# Patient Record
Sex: Female | Born: 1955 | Race: Black or African American | Hispanic: No | Marital: Single | Smoking: Never smoker
Health system: Southern US, Community
[De-identification: ages and names within clinical notes are randomized; demographics above are authoritative.]

## PROBLEM LIST (undated history)

## (undated) DIAGNOSIS — R0989 Other specified symptoms and signs involving the circulatory and respiratory systems: Secondary | ICD-10-CM

## (undated) DIAGNOSIS — M1990 Unspecified osteoarthritis, unspecified site: Secondary | ICD-10-CM

## (undated) DIAGNOSIS — R011 Cardiac murmur, unspecified: Secondary | ICD-10-CM

## (undated) DIAGNOSIS — D573 Sickle-cell trait: Secondary | ICD-10-CM

## (undated) DIAGNOSIS — I10 Essential (primary) hypertension: Secondary | ICD-10-CM

## (undated) HISTORY — DX: Essential (primary) hypertension: I10

## (undated) HISTORY — DX: Unspecified osteoarthritis, unspecified site: M19.90

## (undated) HISTORY — PX: ABDOMINAL HYSTERECTOMY: SHX81

---

## 2005-02-02 DIAGNOSIS — Z5189 Encounter for other specified aftercare: Secondary | ICD-10-CM | POA: Insufficient documentation

## 2005-02-02 HISTORY — PX: ABDOMINAL HYSTERECTOMY: SHX81

## 2005-02-02 HISTORY — DX: Encounter for other specified aftercare: Z51.89

## 2014-01-10 ENCOUNTER — Ambulatory Visit: Payer: Self-pay | Attending: Internal Medicine | Admitting: Internal Medicine

## 2014-01-10 ENCOUNTER — Encounter: Payer: Self-pay | Admitting: Internal Medicine

## 2014-01-10 VITALS — BP 177/109 | HR 83 | Temp 98.1°F | Resp 16 | Ht 71.0 in | Wt 210.0 lb

## 2014-01-10 DIAGNOSIS — I10 Essential (primary) hypertension: Secondary | ICD-10-CM | POA: Insufficient documentation

## 2014-01-10 DIAGNOSIS — Z882 Allergy status to sulfonamides status: Secondary | ICD-10-CM | POA: Insufficient documentation

## 2014-01-10 HISTORY — DX: Essential (primary) hypertension: I10

## 2014-01-10 LAB — COMPLETE METABOLIC PANEL WITH GFR
ALBUMIN: 4 g/dL (ref 3.5–5.2)
ALK PHOS: 87 U/L (ref 39–117)
ALT: 12 U/L (ref 0–35)
AST: 16 U/L (ref 0–37)
BILIRUBIN TOTAL: 0.5 mg/dL (ref 0.2–1.2)
BUN: 10 mg/dL (ref 6–23)
CHLORIDE: 107 meq/L (ref 96–112)
CO2: 30 mEq/L (ref 19–32)
Calcium: 9.5 mg/dL (ref 8.4–10.5)
Creat: 0.63 mg/dL (ref 0.50–1.10)
GFR, Est African American: >89 mL/min
GFR, Est Non African American: >89 mL/min
Glucose, Bld: 88 mg/dL (ref 70–99)
POTASSIUM: 4.1 meq/L (ref 3.5–5.3)
Sodium: 146 mEq/L — ABNORMAL HIGH (ref 135–145)
Total Protein: 7 g/dL (ref 6.0–8.3)

## 2014-01-10 LAB — LIPID PANEL
Cholesterol: 193 mg/dL (ref 0–200)
HDL: 61 mg/dL (ref >39)
LDL CALC: 122 mg/dL — AB (ref 0–99)
Total CHOL/HDL Ratio: 3.2 Ratio
Triglycerides: 48 mg/dL (ref <150)
VLDL: 10 mg/dL (ref 0–40)

## 2014-01-10 LAB — CBC
HCT: 34 % — ABNORMAL LOW (ref 36.0–46.0)
HEMOGLOBIN: 11.8 g/dL — AB (ref 12.0–15.0)
MCH: 26.4 pg (ref 26.0–34.0)
MCHC: 34.7 g/dL (ref 30.0–36.0)
MCV: 76.1 fL — ABNORMAL LOW (ref 78.0–100.0)
MPV: 10.4 fL (ref 9.4–12.4)
PLATELETS: 233 10*3/uL (ref 150–400)
RBC: 4.47 MIL/uL (ref 3.87–5.11)
RDW: 13.6 % (ref 11.5–15.5)
WBC: 4 10*3/uL (ref 4.0–10.5)

## 2014-01-10 MED ORDER — AMLODIPINE BESYLATE-VALSARTAN 10-160 MG PO TABS: 1.0000 | ORAL_TABLET | Freq: Every day | ORAL | Status: DC

## 2014-01-10 MED ORDER — HYDROCHLOROTHIAZIDE 12.5 MG PO CAPS: 12.5000 mg | ORAL_CAPSULE | Freq: Every day | ORAL | Status: DC

## 2014-01-10 NOTE — Progress Notes (Signed)
Patient ID: Kaylee Nelson, female   DOB: 1955-08-19, 58 y.o.   MRN: [id number]  CC: Establish care  HPI:  Patient presents to clinic today with a history of hypertension.  She states that she has been off her blood pressure medication for 2 months.  She states that for the past two weeks she has been having headaches in the mornings.  She reports that she was previously on Exforge and HCTZ which helped to regulate her BP in the past.    [facility]--up to date on mammogram and colonoscopy  Social Hx: Denies tobacco, illicit drug, alcohol use   Allergies  Allergen Reactions  . Sulfa Antibiotics    History reviewed. No pertinent past medical history. No current outpatient prescriptions on file prior to visit.   No current facility-administered medications on file prior to visit.   Family History  Problem Relation Age of Onset  . Cancer Mother   . Cancer Daughter   . Cancer Maternal Aunt    History   Social History  . Marital Status: Married    Spouse Name: N/A    Number of Children: N/A  . Years of Education: N/A   Occupational History  . Not on file.   Social History Main Topics  . Smoking status: Never Smoker   . Smokeless tobacco: Not on file  . Alcohol Use: No  . Drug Use: No  . Sexual Activity: No   Other Topics Concern  . Not on file   Social History Narrative  . No narrative on file    Review of Systems  Eyes: Positive for blurred vision (right eyes).  Respiratory: Negative.   Cardiovascular: Positive for leg swelling. Negative for chest pain, palpitations and claudication.  Neurological: Positive for headaches. Negative for dizziness.      Objective:   Filed Vitals:   01/10/14 1153  BP: 177/109  Pulse: 83  Temp: 98.1 F (36.7 C)  Resp: 16    Physical Exam  Constitutional: She is oriented to person, place, and time.  Cardiovascular: Normal rate, regular rhythm and normal heart sounds.   Pulmonary/Chest: Effort normal and breath sounds  normal.  Musculoskeletal: She exhibits no edema.  Neurological: She is alert and oriented to person, place, and time.  Skin: Skin is warm and dry.     No results found for: WBC, HGB, HCT, MCV, PLT No results found for: CREATININE, BUN, NA, K, CL, CO2  No results found for: HGBA1C Lipid Panel  No results found for: CHOL, TRIG, HDL, CHOLHDL, VLDL, LDLCALC     Assessment and plan:   Leigh was seen today for establish care.  Diagnoses and associated orders for this visit:  Essential hypertension - May begin back on amLODipine-valsartan (EXFORGE) 10-160 MG per tablet; Take 1 tablet by mouth daily and hydrochlorothiazide (MICROZIDE) 12.5 MG capsule; Take 1 capsule (12.5 mg total) by mouth daily. - CBC - COMPLETE METABOLIC PANEL WITH GFR - Lipid panel   Return in about 2 weeks (around 01/24/2014) for Nurse Visit-BP check and 3 mo PCP.       Chari Manning, NP-C [facility] [phone] 01/10/2014, 12:32 PM

## 2014-01-10 NOTE — Progress Notes (Signed)
Pt is here to establish care. Pt has a history of HTN and has not had her BP medications for 3 months. Pt  States that she has headaches in the mornings.

## 2014-01-10 NOTE — Patient Instructions (Signed)
DASH Eating Plan °DASH stands for "Dietary Approaches to Stop Hypertension." The DASH eating plan is a healthy eating plan that has been shown to reduce high blood pressure (hypertension). Additional health benefits may include reducing the risk of type 2 diabetes mellitus, heart disease, and stroke. The DASH eating plan may also help with weight loss. °WHAT DO I NEED TO KNOW ABOUT THE DASH EATING PLAN? °For the DASH eating plan, you will follow these general guidelines: °· Choose foods with a percent daily value for sodium of less than 5% (as listed on the food label). °· Use salt-free seasonings or herbs instead of table salt or sea salt. °· Check with your health care provider or pharmacist before using salt substitutes. °· Eat lower-sodium products, often labeled as "lower sodium" or "no salt added." °· Eat fresh foods. °· Eat more vegetables, fruits, and low-fat dairy products. °· Choose whole grains. Look for the word "whole" as the first word in the ingredient list. °· Choose fish and skinless chicken or turkey more often than red meat. Limit fish, poultry, and meat to 6 oz (170 g) each day. °· Limit sweets, desserts, sugars, and sugary drinks. °· Choose heart-healthy fats. °· Limit cheese to 1 oz (28 g) per day. °· Eat more home-cooked food and less restaurant, buffet, and fast food. °· Limit fried foods. °· Cook foods using methods other than frying. °· Limit canned vegetables. If you do use them, rinse them well to decrease the sodium. °· When eating at a restaurant, ask that your food be prepared with less salt, or no salt if possible. °WHAT FOODS CAN I EAT? °Seek help from a dietitian for individual calorie needs. °Grains °Whole grain or whole wheat bread. Brown rice. Whole grain or whole wheat pasta. Quinoa, bulgur, and whole grain cereals. Low-sodium cereals. Corn or whole wheat flour tortillas. Whole grain cornbread. Whole grain crackers. Low-sodium crackers. °Vegetables °Fresh or frozen vegetables  (raw, steamed, roasted, or grilled). Low-sodium or reduced-sodium tomato and vegetable juices. Low-sodium or reduced-sodium tomato sauce and paste. Low-sodium or reduced-sodium canned vegetables.  °Fruits °All fresh, canned (in natural juice), or frozen fruits. °Meat and Other Protein Products °Ground beef (85% or leaner), grass-fed beef, or beef trimmed of fat. Skinless chicken or turkey. Ground chicken or turkey. Pork trimmed of fat. All fish and seafood. Eggs. Dried beans, peas, or lentils. Unsalted nuts and seeds. Unsalted canned beans. °Dairy °Low-fat dairy products, such as skim or 1% milk, 2% or reduced-fat cheeses, low-fat ricotta or cottage cheese, or plain low-fat yogurt. Low-sodium or reduced-sodium cheeses. °Fats and Oils °Tub margarines without trans fats. Light or reduced-fat mayonnaise and salad dressings (reduced sodium). Avocado. Safflower, olive, or canola oils. Natural peanut or almond butter. °Other °Unsalted popcorn and pretzels. °The items listed above may not be a complete list of recommended foods or beverages. Contact your dietitian for more options. °WHAT FOODS ARE NOT RECOMMENDED? °Grains °White bread. White pasta. White rice. Refined cornbread. Bagels and croissants. Crackers that contain trans fat. °Vegetables °Creamed or fried vegetables. Vegetables in a cheese sauce. Regular canned vegetables. Regular canned tomato sauce and paste. Regular tomato and vegetable juices. °Fruits °Dried fruits. Canned fruit in light or heavy syrup. Fruit juice. °Meat and Other Protein Products °Fatty cuts of meat. Ribs, chicken wings, bacon, sausage, bologna, salami, chitterlings, fatback, hot dogs, bratwurst, and packaged luncheon meats. Salted nuts and seeds. Canned beans with salt. °Dairy °Whole or 2% milk, cream, half-and-half, and cream cheese. Whole-fat or sweetened yogurt. Full-fat   cheeses or blue cheese. Nondairy creamers and whipped toppings. Processed cheese, cheese spreads, or cheese  curds. °Condiments °Onion and garlic salt, seasoned salt, table salt, and sea salt. Canned and packaged gravies. Worcestershire sauce. Tartar sauce. Barbecue sauce. Teriyaki sauce. Soy sauce, including reduced sodium. Steak sauce. Fish sauce. Oyster sauce. Cocktail sauce. Horseradish. Ketchup and mustard. Meat flavorings and tenderizers. Bouillon cubes. Hot sauce. Tabasco sauce. Marinades. Taco seasonings. Relishes. °Fats and Oils °Butter, stick margarine, lard, shortening, ghee, and bacon fat. Coconut, palm kernel, or palm oils. Regular salad dressings. °Other °Pickles and olives. Salted popcorn and pretzels. °The items listed above may not be a complete list of foods and beverages to avoid. Contact your dietitian for more information. °WHERE CAN I FIND MORE INFORMATION? °National Heart, Lung, and Blood Institute: www.nhlbi.nih.gov/health/health-topics/topics/dash/ °Document Released: 01/08/2011 Document Revised: 06/05/2013 Document Reviewed: 11/23/2012 °ExitCare® Patient Information ©2015 Exit[employer], [employer]. This information is not intended to replace advice given to you by your health care provider. Make sure you discuss any questions you have with your health care provider. ° °

## 2014-01-15 ENCOUNTER — Telehealth: Payer: Self-pay | Admitting: *Deleted

## 2014-01-15 NOTE — Telephone Encounter (Signed)
-----   Message from Lance Bosch, NP sent at 01/11/2014  6:20 PM EST ----- Labs are within normal limits with exception of Cholesterol slightly elevated. Please provide appropriate education regarding diet and exercise.

## 2014-01-15 NOTE — Telephone Encounter (Signed)
Left message on patient's VM to return call to discuss lab results

## 2014-01-30 ENCOUNTER — Ambulatory Visit: Payer: Self-pay | Attending: Internal Medicine

## 2014-01-30 ENCOUNTER — Ambulatory Visit: Payer: Self-pay

## 2014-01-30 NOTE — Patient Instructions (Signed)
Hypertension Hypertension, commonly called high blood pressure, is when the force of blood pumping through your arteries is too strong. Your arteries are the blood vessels that carry blood from your heart throughout your body. A blood pressure reading consists of a higher number over a lower number, such as 110/72. The higher number (systolic) is the pressure inside your arteries when your heart pumps. The lower number (diastolic) is the pressure inside your arteries when your heart relaxes. Ideally you want your blood pressure below 120/80. Hypertension forces your heart to work harder to pump blood. Your arteries may become narrow or stiff. Having hypertension puts you at risk for heart disease, stroke, and other problems.  RISK FACTORS Some risk factors for high blood pressure are controllable. Others are not.  Risk factors you cannot control include:   Race. You may be at higher risk if you are African American.  Age. Risk increases with age.  Gender. Men are at higher risk than women before age 45 years. After age 65, women are at higher risk than men. Risk factors you can control include:  Not getting enough exercise or physical activity.  Being overweight.  Getting too much fat, sugar, calories, or salt in your diet.  Drinking too much alcohol. SIGNS AND SYMPTOMS Hypertension does not usually cause signs or symptoms. Extremely high blood pressure (hypertensive crisis) may cause headache, anxiety, shortness of breath, and nosebleed. DIAGNOSIS  To check if you have hypertension, your health care provider will measure your blood pressure while you are seated, with your arm held at the level of your heart. It should be measured at least twice using the same arm. Certain conditions can cause a difference in blood pressure between your right and left arms. A blood pressure reading that is higher than normal on one occasion does not mean that you need treatment. If one blood pressure reading  is high, ask your health care provider about having it checked again. TREATMENT  Treating high blood pressure includes making lifestyle changes and possibly taking medicine. Living a healthy lifestyle can help lower high blood pressure. You may need to change some of your habits. Lifestyle changes may include:  Following the DASH diet. This diet is high in fruits, vegetables, and whole grains. It is low in salt, red meat, and added sugars.  Getting at least 2 hours of brisk physical activity every week.  Losing weight if necessary.  Not smoking.  Limiting alcoholic beverages.  Learning ways to reduce stress. If lifestyle changes are not enough to get your blood pressure under control, your health care provider may prescribe medicine. You may need to take more than one. Work closely with your health care provider to understand the risks and benefits. HOME CARE INSTRUCTIONS  Have your blood pressure rechecked as directed by your health care provider.   Take medicines only as directed by your health care provider. Follow the directions carefully. Blood pressure medicines must be taken as prescribed. The medicine does not work as well when you skip doses. Skipping doses also puts you at risk for problems.   Do not smoke.   Monitor your blood pressure at home as directed by your health care provider. SEEK MEDICAL CARE IF:   You think you are having a reaction to medicines taken.  You have recurrent headaches or feel dizzy.  You have swelling in your ankles.  You have trouble with your vision. SEEK IMMEDIATE MEDICAL CARE IF:  You develop a severe headache or confusion.    You have unusual weakness, numbness, or feel faint.  You have severe chest or abdominal pain.  You vomit repeatedly.  You have trouble breathing. MAKE SURE YOU:   Understand these instructions.  Will watch your condition.  Will get help right away if you are not doing well or get worse. Document  Released: 01/19/2005 Document Revised: 06/05/2013 Document Reviewed: 11/11/2012 ExitCare Patient Information 2015 Exit[employer]. This information is not intended to replace advice given to you by your health care provider. Make sure you discuss any questions you have with your health care provider. DASH Eating Plan DASH stands for "Dietary Approaches to Stop Hypertension." The DASH eating plan is a healthy eating plan that has been shown to reduce high blood pressure (hypertension). Additional health benefits may include reducing the risk of type 2 diabetes mellitus, heart disease, and stroke. The DASH eating plan may also help with weight loss. WHAT DO I NEED TO KNOW ABOUT THE DASH EATING PLAN? For the DASH eating plan, you will follow these general guidelines:  Choose foods with a percent daily value for sodium of less than 5% (as listed on the food label).  Use salt-free seasonings or herbs instead of table salt or sea salt.  Check with your health care provider or pharmacist before using salt substitutes.  Eat lower-sodium products, often labeled as "lower sodium" or "no salt added."  Eat fresh foods.  Eat more vegetables, fruits, and low-fat dairy products.  Choose whole grains. Look for the word "whole" as the first word in the ingredient list.  Choose fish and skinless chicken or turkey more often than red meat. Limit fish, poultry, and meat to 6 oz (170 g) each day.  Limit sweets, desserts, sugars, and sugary drinks.  Choose heart-healthy fats.  Limit cheese to 1 oz (28 g) per day.  Eat more home-cooked food and less restaurant, buffet, and fast food.  Limit fried foods.  Cook foods using methods other than frying.  Limit canned vegetables. If you do use them, rinse them well to decrease the sodium.  When eating at a restaurant, ask that your food be prepared with less salt, or no salt if possible. WHAT FOODS CAN I EAT? Seek help from a dietitian for individual  calorie needs. Grains Whole grain or whole wheat bread. Brown rice. Whole grain or whole wheat pasta. Quinoa, bulgur, and whole grain cereals. Low-sodium cereals. Corn or whole wheat flour tortillas. Whole grain cornbread. Whole grain crackers. Low-sodium crackers. Vegetables Fresh or frozen vegetables (raw, steamed, roasted, or grilled). Low-sodium or reduced-sodium tomato and vegetable juices. Low-sodium or reduced-sodium tomato sauce and paste. Low-sodium or reduced-sodium canned vegetables.  Fruits All fresh, canned (in natural juice), or frozen fruits. Meat and Other Protein Products Ground beef (85% or leaner), grass-fed beef, or beef trimmed of fat. Skinless chicken or turkey. Ground chicken or turkey. Pork trimmed of fat. All fish and seafood. Eggs. Dried beans, peas, or lentils. Unsalted nuts and seeds. Unsalted canned beans. Dairy Low-fat dairy products, such as skim or 1% milk, 2% or reduced-fat cheeses, low-fat ricotta or cottage cheese, or plain low-fat yogurt. Low-sodium or reduced-sodium cheeses. Fats and Oils Tub margarines without trans fats. Light or reduced-fat mayonnaise and salad dressings (reduced sodium). Avocado. Safflower, olive, or canola oils. Natural peanut or almond butter. Other Unsalted popcorn and pretzels. The items listed above may not be a complete list of recommended foods or beverages. Contact your dietitian for more options. WHAT FOODS ARE NOT RECOMMENDED? Grains White bread.   White pasta. White rice. Refined cornbread. Bagels and croissants. Crackers that contain trans fat. Vegetables Creamed or fried vegetables. Vegetables in a cheese sauce. Regular canned vegetables. Regular canned tomato sauce and paste. Regular tomato and vegetable juices. Fruits Dried fruits. Canned fruit in light or heavy syrup. Fruit juice. Meat and Other Protein Products Fatty cuts of meat. Ribs, chicken wings, bacon, sausage, bologna, salami, chitterlings, fatback, hot dogs,  bratwurst, and packaged luncheon meats. Salted nuts and seeds. Canned beans with salt. Dairy Whole or 2% milk, cream, half-and-half, and cream cheese. Whole-fat or sweetened yogurt. Full-fat cheeses or blue cheese. Nondairy creamers and whipped toppings. Processed cheese, cheese spreads, or cheese curds. Condiments Onion and garlic salt, seasoned salt, table salt, and sea salt. Canned and packaged gravies. Worcestershire sauce. Tartar sauce. Barbecue sauce. Teriyaki sauce. Soy sauce, including reduced sodium. Steak sauce. Fish sauce. Oyster sauce. Cocktail sauce. Horseradish. Ketchup and mustard. Meat flavorings and tenderizers. Bouillon cubes. Hot sauce. Tabasco sauce. Marinades. Taco seasonings. Relishes. Fats and Oils Butter, stick margarine, lard, shortening, ghee, and bacon fat. Coconut, palm kernel, or palm oils. Regular salad dressings. Other Pickles and olives. Salted popcorn and pretzels. The items listed above may not be a complete list of foods and beverages to avoid. Contact your dietitian for more information. WHERE CAN I FIND MORE INFORMATION? National Heart, Lung, and Blood Institute: www.nhlbi.nih.gov/health/health-topics/topics/dash/ Document Released: 01/08/2011 Document Revised: 06/05/2013 Document Reviewed: 11/23/2012 ExitCare Patient Information 2015 ExitCare, [employer]. This information is not intended to replace advice given to you by your health care provider. Make sure you discuss any questions you have with your health care provider.  

## 2014-01-30 NOTE — Progress Notes (Unsigned)
Patient ID: Kaylee Nelson, female   DOB: [birth year], 58 y.o.   MRN: [id number] Pt comes in today for blood pressure recheck after started on Amlodipine-Valsartan and HCTZ per last visit 01/10/14 with elevated BP 177/109 83 Pt states she is compliant with taking medication daily with BP home monitoring  Ranging 130's/70-80's Denies blurred vision or headaches Refills at [place] BP- 129/73 79 Pt encouraged to keep up good work and continue DASH diet

## 2014-02-07 ENCOUNTER — Encounter: Payer: Self-pay | Admitting: Internal Medicine

## 2014-02-07 ENCOUNTER — Ambulatory Visit: Payer: Self-pay | Attending: Internal Medicine | Admitting: Internal Medicine

## 2014-02-07 VITALS — BP 115/77 | HR 94 | Temp 98.6°F | Resp 16 | Ht 71.0 in | Wt 212.0 lb

## 2014-02-07 DIAGNOSIS — Z79899 Other long term (current) drug therapy: Secondary | ICD-10-CM | POA: Insufficient documentation

## 2014-02-07 DIAGNOSIS — Z90710 Acquired absence of both cervix and uterus: Secondary | ICD-10-CM | POA: Insufficient documentation

## 2014-02-07 DIAGNOSIS — Z0000 Encounter for general adult medical examination without abnormal findings: Secondary | ICD-10-CM

## 2014-02-07 DIAGNOSIS — I10 Essential (primary) hypertension: Secondary | ICD-10-CM | POA: Insufficient documentation

## 2014-02-07 NOTE — Progress Notes (Signed)
Patient is here for physical Patient had blood work done 12/15 Patient does not need any med refills and has had her flu shot. Patient has employment medical form she would like filled out.

## 2014-02-07 NOTE — Progress Notes (Signed)
Patient ID: Kaylee Nelson, female   DOB: [birth year], 59 y.o.   MRN: [id number]  CC: physical  HPI: Kaylee Nelson is a 59 y.o. female here today for a follow up visit.  Patient has past medical history of hypertension.  She presents today for a annual physical.  She reports that after her hysterectomy she was told that she will not need a pap smear.  She states that she only has her ovaries.  She reports that she has had all STD testing which came back negative.  She denies vaginal discharge itch or odor.  She refuses STD testing today.   Surgical history: hysterectomy, rotator cuff repair Social Hx: denies alchol, tobacco, drug use. Married with 4 children.    Patient has No headache, No chest pain, No abdominal pain - No Nausea, No new weakness tingling or numbness, No Cough - SOB.  Allergies  Allergen Reactions  . Sulfa Antibiotics    Past Medical History  Diagnosis Date  . Hypertension    Current Outpatient Prescriptions on File Prior to Visit  Medication Sig Dispense Refill  . amLODipine-valsartan (EXFORGE) 10-160 MG per tablet Take 1 tablet by mouth daily. 30 tablet 3  . hydrochlorothiazide (MICROZIDE) 12.5 MG capsule Take 1 capsule (12.5 mg total) by mouth daily. 30 capsule 3   No current facility-administered medications on file prior to visit.   Family History  Problem Relation Age of Onset  . Cancer Mother   . Cancer Daughter   . Cancer Maternal Aunt    History   Social History  . Marital Status: Married    Spouse Name: N/A    Number of Children: N/A  . Years of Education: N/A   Occupational History  . Not on file.   Social History Main Topics  . Smoking status: Never Smoker   . Smokeless tobacco: Not on file  . Alcohol Use: No  . Drug Use: No  . Sexual Activity: No   Other Topics Concern  . Not on file   Social History Narrative    Review of Systems: Constitutional: Negative for fever, chills, diaphoresis, activity change, appetite change and  fatigue. HENT: Negative for ear pain, nosebleeds, congestion, facial swelling, rhinorrhea, neck pain, neck stiffness and ear discharge.  Eyes: Negative for pain, discharge, redness, itching and visual disturbance. Respiratory: Negative for cough, choking, chest tightness, shortness of breath, wheezing and stridor.  Cardiovascular: Negative for chest pain, palpitations and leg swelling. Gastrointestinal: Negative for abdominal distention. Genitourinary: Negative for dysuria, urgency, frequency, hematuria, flank pain, decreased urine volume, difficulty urinating and dyspareunia.  Musculoskeletal: Negative for back pain, joint swelling, arthralgias and gait problem. Neurological: Negative for dizziness, tremors, seizures, syncope, facial asymmetry, speech difficulty, weakness, light-headedness, numbness and headaches.  Hematological: Negative for adenopathy. Does not bruise/bleed easily. Psychiatric/Behavioral: Negative for hallucinations, behavioral problems, confusion, dysphoric mood, decreased concentration and agitation.    Objective:   Filed Vitals:   02/07/14 1513  BP: 115/77  Pulse: 94  Temp: 98.6 F (37 C)  Resp: 16    Physical Exam: Constitutional: Patient appears well-developed and well-nourished. No distress. HENT: Normocephalic, atraumatic, External right and left ear normal. Oropharynx is clear and moist.  Eyes: Conjunctivae and EOM are normal. PERRLA, no scleral icterus. Neck: Normal ROM. Neck supple. No JVD. No tracheal deviation. No thyromegaly. CVS: RRR, S1/S2 +, no murmurs, no gallops, no carotid bruit.  Pulmonary: Effort and breath sounds normal, no stridor, rhonchi, wheezes, rales.  Abdominal: Soft. BS +,  no distension, tenderness,  rebound or guarding.  Musculoskeletal: Normal range of motion. No edema and no tenderness.  Lymphadenopathy: No lymphadenopathy noted, cervical Neuro: Alert. Normal reflexes, muscle tone coordination. No cranial nerve deficit. Skin:  Skin is warm and dry. No rash noted. Not diaphoretic. No erythema. No pallor. Psychiatric: Normal mood and affect. Behavior, judgment, thought content normal.  Physical Exam  Pulmonary/Chest: Right breast exhibits no inverted nipple, no mass and no skin change. Left breast exhibits no inverted nipple, no mass and no skin change.  Genitourinary: No breast tenderness or discharge.     Lab Results  Component Value Date   WBC 4.0 01/10/2014   HGB 11.8* 01/10/2014   HCT 34.0* 01/10/2014   MCV 76.1* 01/10/2014   PLT 233 01/10/2014   Lab Results  Component Value Date   CREATININE 0.63 01/10/2014   BUN 10 01/10/2014   NA 146* 01/10/2014   K 4.1 01/10/2014   CL 107 01/10/2014   CO2 30 01/10/2014    No results found for: HGBA1C Lipid Panel     Component Value Date/Time   CHOL 193 01/10/2014 1253   TRIG 48 01/10/2014 1253   HDL 61 01/10/2014 1253   CHOLHDL 3.2 01/10/2014 1253   VLDL 10 01/10/2014 1253   LDLCALC 122* 01/10/2014 1253       Assessment and plan:   Kaylee Nelson was seen today for annual exam and hypertension.  Diagnoses and associated orders for this visit:  Annual physical exam Explained routine health maintanence for women including annual influenza vaccine, pneumonia vaccine, breast cancer screening after age 35, montly breast exams, STD prevention and screening, colonoscopy at age 64, smoking cessation, avoiding alcohol misuse  Return in about 3 months (around 05/09/2014) for Hypertension.        Chari Manning, NP-C [facility] [phone] 02/07/2014, 3:43 PM

## 2014-02-07 NOTE — Patient Instructions (Signed)

## 2014-02-12 ENCOUNTER — Telehealth: Payer: Self-pay | Admitting: Internal Medicine

## 2014-02-12 NOTE — Telephone Encounter (Signed)
Pt states she has been feeling really fatigued and not sure if it has to do with current bp med she is taking. Please f/u with pt.

## 2014-02-14 ENCOUNTER — Telehealth: Payer: Self-pay | Admitting: Internal Medicine

## 2014-02-14 NOTE — Telephone Encounter (Signed)
Pt complaining of cough. Advised to come to walking clinic tomorrow between 9-11 and 2-4

## 2014-02-14 NOTE — Telephone Encounter (Signed)
Patient came into facility stating that she has not been feeling well, and did not have much energy with a lot of fatigue followed with a cough. Patient would like to know if it is related to her blood pressure medication. Please f/u with pt.

## 2014-02-14 NOTE — Telephone Encounter (Signed)
Left voice message with female to return call

## 2014-02-26 ENCOUNTER — Ambulatory Visit: Payer: Self-pay

## 2014-04-27 ENCOUNTER — Ambulatory Visit: Payer: Self-pay | Attending: Internal Medicine

## 2014-05-01 ENCOUNTER — Encounter: Payer: Self-pay | Admitting: Internal Medicine

## 2014-05-01 ENCOUNTER — Ambulatory Visit: Payer: Self-pay | Attending: Internal Medicine | Admitting: Internal Medicine

## 2014-05-01 VITALS — BP 140/80 | HR 70 | Temp 97.9°F | Resp 16 | Ht 71.0 in | Wt 202.0 lb

## 2014-05-01 DIAGNOSIS — R05 Cough: Secondary | ICD-10-CM | POA: Insufficient documentation

## 2014-05-01 DIAGNOSIS — R062 Wheezing: Secondary | ICD-10-CM | POA: Insufficient documentation

## 2014-05-01 DIAGNOSIS — J069 Acute upper respiratory infection, unspecified: Secondary | ICD-10-CM

## 2014-05-01 DIAGNOSIS — I10 Essential (primary) hypertension: Secondary | ICD-10-CM

## 2014-05-01 DIAGNOSIS — R1084 Generalized abdominal pain: Secondary | ICD-10-CM

## 2014-05-01 DIAGNOSIS — R51 Headache: Secondary | ICD-10-CM | POA: Insufficient documentation

## 2014-05-01 DIAGNOSIS — J302 Other seasonal allergic rhinitis: Secondary | ICD-10-CM

## 2014-05-01 LAB — POCT URINALYSIS DIPSTICK
BILIRUBIN UA: NEGATIVE
Blood, UA: NEGATIVE
GLUCOSE UA: NEGATIVE
Ketones, UA: NEGATIVE
NITRITE UA: NEGATIVE
Protein, UA: NEGATIVE
Spec Grav, UA: 1.015
Urobilinogen, UA: 0.2
pH, UA: 5.5

## 2014-05-01 MED ORDER — LORATADINE 10 MG PO TABS: 10.0000 mg | ORAL_TABLET | Freq: Every day | ORAL | Status: DC

## 2014-05-01 MED ORDER — AMLODIPINE BESYLATE-VALSARTAN 10-160 MG PO TABS
1.0000 | ORAL_TABLET | Freq: Every day | ORAL | Status: DC
Start: 2014-05-01 — End: 2014-09-28

## 2014-05-01 MED ORDER — HYDROCHLOROTHIAZIDE 12.5 MG PO CAPS: 12.5000 mg | ORAL_CAPSULE | Freq: Every day | ORAL | Status: DC

## 2014-05-01 MED ORDER — AMOXICILLIN-POT CLAVULANATE 875-125 MG PO TABS
1.0000 | ORAL_TABLET | Freq: Two times a day (BID) | ORAL | Status: DC
Start: 2014-05-01 — End: 2014-10-09

## 2014-05-01 MED ORDER — FLUTICASONE PROPIONATE 50 MCG/ACT NA SUSP: 2.0000 | Freq: Every day | NASAL | Status: DC

## 2014-05-01 NOTE — Patient Instructions (Addendum)
Antibiotic may cause some diarrhea the first couple of days    Upper Respiratory Infection, Adult An upper respiratory infection (URI) is also sometimes known as the common cold. The upper respiratory tract includes the nose, sinuses, throat, trachea, and bronchi. Bronchi are the airways leading to the lungs. Most people improve within 1 week, but symptoms can last up to 2 weeks. A residual cough may last even longer.  CAUSES Many different viruses can infect the tissues lining the upper respiratory tract. The tissues become irritated and inflamed and often become very moist. Mucus production is also common. A cold is contagious. You can easily spread the virus to others by oral contact. This includes kissing, sharing a glass, coughing, or sneezing. Touching your mouth or nose and then touching a surface, which is then touched by another person, can also spread the virus. SYMPTOMS  Symptoms typically develop 1 to 3 days after you come in contact with a cold virus. Symptoms vary from person to person. They may include:  Runny nose.  Sneezing.  Nasal congestion.  Sinus irritation.  Sore throat.  Loss of voice (laryngitis).  Cough.  Fatigue.  Muscle aches.  Loss of appetite.  Headache.  Low-grade fever. DIAGNOSIS  You might diagnose your own cold based on familiar symptoms, since most people get a cold 2 to 3 times a year. Your caregiver can confirm this based on your exam. Most importantly, your caregiver can check that your symptoms are not due to another disease such as strep throat, sinusitis, pneumonia, asthma, or epiglottitis. Blood tests, throat tests, and X-rays are not necessary to diagnose a common cold, but they may sometimes be helpful in excluding other more serious diseases. Your caregiver will decide if any further tests are required. RISKS AND COMPLICATIONS  You may be at risk for a more severe case of the common cold if you smoke cigarettes, have chronic heart  disease (such as heart failure) or lung disease (such as asthma), or if you have a weakened immune system. The very young and very old are also at risk for more serious infections. Bacterial sinusitis, middle ear infections, and bacterial pneumonia can complicate the common cold. The common cold can worsen asthma and chronic obstructive pulmonary disease (COPD). Sometimes, these complications can require emergency medical care and may be life-threatening. PREVENTION  The best way to protect against getting a cold is to practice good hygiene. Avoid oral or hand contact with people with cold symptoms. Wash your hands often if contact occurs. There is no clear evidence that vitamin C, vitamin E, echinacea, or exercise reduces the chance of developing a cold. However, it is always recommended to get plenty of rest and practice good nutrition. TREATMENT  Treatment is directed at relieving symptoms. There is no cure. Antibiotics are not effective, because the infection is caused by a virus, not by bacteria. Treatment may include:  Increased fluid intake. Sports drinks offer valuable electrolytes, sugars, and fluids.  Breathing heated mist or steam (vaporizer or shower).  Eating chicken soup or other clear broths, and maintaining good nutrition.  Getting plenty of rest.  Using gargles or lozenges for comfort.  Controlling fevers with ibuprofen or acetaminophen as directed by your caregiver.  Increasing usage of your inhaler if you have asthma. Zinc gel and zinc lozenges, taken in the first 24 hours of the common cold, can shorten the duration and lessen the severity of symptoms. Pain medicines may help with fever, muscle aches, and throat pain. A variety  of non-prescription medicines are available to treat congestion and runny nose. Your caregiver can make recommendations and may suggest nasal or lung inhalers for other symptoms.  HOME CARE INSTRUCTIONS   Only take over-the-counter or prescription  medicines for pain, discomfort, or fever as directed by your caregiver.  Use a warm mist humidifier or inhale steam from a shower to increase air moisture. This may keep secretions moist and make it easier to breathe.  Drink enough water and fluids to keep your urine clear or pale yellow.  Rest as needed.  Return to work when your temperature has returned to normal or as your caregiver advises. You may need to stay home longer to avoid infecting others. You can also use a face mask and careful hand washing to prevent spread of the virus. SEEK MEDICAL CARE IF:   After the first few days, you feel you are getting worse rather than better.  You need your caregiver's advice about medicines to control symptoms.  You develop chills, worsening shortness of breath, or brown or red sputum. These may be signs of pneumonia.  You develop yellow or brown nasal discharge or pain in the face, especially when you bend forward. These may be signs of sinusitis.  You develop a fever, swollen neck glands, pain with swallowing, or white areas in the back of your throat. These may be signs of strep throat. SEEK IMMEDIATE MEDICAL CARE IF:   You have a fever.  You develop severe or persistent headache, ear pain, sinus pain, or chest pain.  You develop wheezing, a prolonged cough, cough up blood, or have a change in your usual mucus (if you have chronic lung disease).  You develop sore muscles or a stiff neck. Document Released: 07/15/2000 Document Revised: 04/13/2011 Document Reviewed: 04/26/2013 [facility] Patient Information 2015 [place], [place]. This information is not intended to replace advice given to you by your health care provider. Make sure you discuss any questions you have with your health care provider.

## 2014-05-01 NOTE — Progress Notes (Signed)
Pt is here today c/o a bad cold for the past 2 months w/ no improvement. Pt is c/o pain in her belly button.

## 2014-05-01 NOTE — Progress Notes (Signed)
Patient ID: Kaylee Nelson, female   DOB: 1955-03-29, 59 y.o.   MRN: [id number]  CC: HTN, cold symptoms   HPI: Kaylee Nelson is a 59 y.o. female here today for a follow up visit.  Patient has past medical history of hypertension. She reports that she takes her medications daily.  For the last 2 months patient reports symptoms of wheezing at night, headaches, watery eyes, sneezing, red eyes, chills, night sweats, cough, sore throat. Has tried coricidin, cough and flu theraflu. Did not try allergy medication.  Patient reports that she has more cough at night and feels like she is having abdominal muscle pain from prolonged coughing.  Patient has No headache, No chest pain, No abdominal pain - No Nausea, No new weakness tingling or numbness, SOB.  Allergies  Allergen Reactions  . Sulfa Antibiotics    Past Medical History  Diagnosis Date  . Hypertension    Current Outpatient Prescriptions on File Prior to Visit  Medication Sig Dispense Refill  . amLODipine-valsartan (EXFORGE) 10-160 MG per tablet Take 1 tablet by mouth daily. 30 tablet 3  . hydrochlorothiazide (MICROZIDE) 12.5 MG capsule Take 1 capsule (12.5 mg total) by mouth daily. 30 capsule 3   No current facility-administered medications on file prior to visit.   Family History  Problem Relation Age of Onset  . Cancer Mother   . Cancer Daughter   . Cancer Maternal Aunt    History   Social History  . Marital Status: Married    Spouse Name: N/A  . Number of Children: N/A  . Years of Education: N/A   Occupational History  . Not on file.   Social History Main Topics  . Smoking status: Never Smoker   . Smokeless tobacco: Not on file  . Alcohol Use: No  . Drug Use: No  . Sexual Activity: No   Other Topics Concern  . Not on file   Social History Narrative    Review of Systems  Constitutional: Positive for chills and malaise/fatigue. Negative for fever.  HENT: Positive for congestion and sore throat.   Respiratory:  Positive for cough. Negative for sputum production and shortness of breath.   Cardiovascular: Negative for chest pain and leg swelling.  Neurological: Positive for headaches.  All other systems reviewed and are negative.    Objective:   Filed Vitals:   05/01/14 1535  BP: 167/71  Pulse: 70  Temp: 97.9 F (36.6 C)  Resp: 16    Physical Exam  Constitutional: She is oriented to person, place, and time.  HENT:  Mouth/Throat: No oropharyngeal exudate.  Allergic shiners bilaterally Cobblestoning of throat  Bilaterally red ear canals, left side tender to touch   Cardiovascular: Normal rate, regular rhythm and normal heart sounds.   Pulmonary/Chest: Effort normal and breath sounds normal. She has no wheezes.  Abdominal: Soft. Bowel sounds are normal. There is tenderness (generalized ).  Lymphadenopathy:    She has cervical adenopathy.  Neurological: She is alert and oriented to person, place, and time.  Skin: Skin is warm and dry.  Vitals reviewed.    Lab Results  Component Value Date   WBC 4.0 01/10/2014   HGB 11.8* 01/10/2014   HCT 34.0* 01/10/2014   MCV 76.1* 01/10/2014   PLT 233 01/10/2014   Lab Results  Component Value Date   CREATININE 0.63 01/10/2014   BUN 10 01/10/2014   NA 146* 01/10/2014   K 4.1 01/10/2014   CL 107 01/10/2014   CO2 30 01/10/2014  No results found for: HGBA1C Lipid Panel     Component Value Date/Time   CHOL 193 01/10/2014 1253   TRIG 48 01/10/2014 1253   HDL 61 01/10/2014 1253   CHOLHDL 3.2 01/10/2014 1253   VLDL 10 01/10/2014 1253   LDLCALC 122* 01/10/2014 1253       Assessment and plan:   Kaylee Nelson was seen today for follow-up.  Diagnoses and all orders for this visit:  URI (upper respiratory infection) Orders: -     amoxicillin-clavulanate (AUGMENTIN) 875-125 MG per tablet; Take 1 tablet by mouth 2 (two) times daily. Will treat since symptoms have been present for several weeks   Other seasonal allergic  rhinitis Orders: -     loratadine (CLARITIN) 10 MG tablet; Take 1 tablet (10 mg total) by mouth daily. -     fluticasone (FLONASE) 50 MCG/ACT nasal spray; Place 2 sprays into both nostrils daily.  Essential hypertension Orders: -     amLODipine-valsartan (EXFORGE) 10-160 MG per tablet; Take 1 tablet by mouth daily. -     hydrochlorothiazide (MICROZIDE) 12.5 MG capsule; Take 1 capsule (12.5 mg total) by mouth daily. Blood pressure elevated today but has been normal on previous visits. Likely elevated d/t prolonged decongestant use. I will not make changes to regimen today  Generalized abdominal pain Orders: -     Urinalysis Dipstick From prolonged coughing, strained muscle   Return in about 3 months (around 08/01/2014) for Hypertension.        Chari Manning, NP-C [facility] [phone] 05/01/2014, 4:25 PM

## 2014-09-20 ENCOUNTER — Other Ambulatory Visit: Payer: Self-pay | Admitting: Internal Medicine

## 2014-09-28 ENCOUNTER — Other Ambulatory Visit: Payer: Self-pay

## 2014-09-28 DIAGNOSIS — I10 Essential (primary) hypertension: Secondary | ICD-10-CM

## 2014-09-28 MED ORDER — HYDROCHLOROTHIAZIDE 12.5 MG PO CAPS: 12.5000 mg | ORAL_CAPSULE | Freq: Every day | ORAL | Status: DC

## 2014-09-28 MED ORDER — AMLODIPINE BESYLATE-VALSARTAN 10-160 MG PO TABS: 1.0000 | ORAL_TABLET | Freq: Every day | ORAL | Status: DC

## 2014-10-09 ENCOUNTER — Encounter: Payer: Self-pay | Admitting: Internal Medicine

## 2014-10-09 ENCOUNTER — Ambulatory Visit: Payer: Self-pay | Attending: Internal Medicine | Admitting: Internal Medicine

## 2014-10-09 VITALS — BP 137/80 | HR 88 | Temp 98.0°F | Resp 16 | Ht 71.0 in | Wt 195.2 lb

## 2014-10-09 DIAGNOSIS — I10 Essential (primary) hypertension: Secondary | ICD-10-CM | POA: Insufficient documentation

## 2014-10-09 DIAGNOSIS — J302 Other seasonal allergic rhinitis: Secondary | ICD-10-CM | POA: Insufficient documentation

## 2014-10-09 LAB — BASIC METABOLIC PANEL
BUN: 11 mg/dL (ref 7–25)
CHLORIDE: 103 mmol/L (ref 98–110)
CO2: 33 mmol/L — ABNORMAL HIGH (ref 20–31)
Calcium: 9.6 mg/dL (ref 8.6–10.4)
Creat: 0.7 mg/dL (ref 0.50–1.05)
Glucose, Bld: 108 mg/dL — ABNORMAL HIGH (ref 65–99)
POTASSIUM: 3.9 mmol/L (ref 3.5–5.3)
Sodium: 143 mmol/L (ref 135–146)

## 2014-10-09 MED ORDER — AMLODIPINE BESYLATE-VALSARTAN 10-160 MG PO TABS
1.0000 | ORAL_TABLET | Freq: Every day | ORAL | Status: DC
Start: 2014-10-09 — End: 2014-10-09

## 2014-10-09 MED ORDER — LORATADINE 10 MG PO TABS: 10.0000 mg | ORAL_TABLET | Freq: Every day | ORAL | Status: DC

## 2014-10-09 MED ORDER — AMLODIPINE BESYLATE-VALSARTAN 10-160 MG PO TABS: 1.0000 | ORAL_TABLET | Freq: Every day | ORAL | Status: DC

## 2014-10-09 MED ORDER — HYDROCHLOROTHIAZIDE 12.5 MG PO CAPS: 12.5000 mg | ORAL_CAPSULE | Freq: Every day | ORAL | Status: DC

## 2014-10-09 NOTE — Progress Notes (Signed)
Patient ID: Kaylee Nelson, female   DOB: November 05, 1955, 59 y.o.   MRN: [id number] Subjective:  Kaylee Nelson is a 59 y.o. female with hypertension here for follow-up.   Current Outpatient Prescriptions  Medication Sig Dispense Refill  . amLODipine-valsartan (EXFORGE) 10-160 MG per tablet Take 1 tablet by mouth daily. 30 tablet 0  . hydrochlorothiazide (MICROZIDE) 12.5 MG capsule Take 1 capsule (12.5 mg total) by mouth daily. 30 capsule 0  . loratadine (CLARITIN) 10 MG tablet Take 1 tablet (10 mg total) by mouth daily. 30 tablet 11  . fluticasone (FLONASE) 50 MCG/ACT nasal spray Place 2 sprays into both nostrils daily. (Patient not taking: Reported on 10/09/2014) 16 g 6  . Vitamin D, Ergocalciferol, (DRISDOL) 50000 UNITS CAPS capsule Take 50,000 Units by mouth every 7 (seven) days.     No current facility-administered medications for this visit.    Hypertension ROS: taking medications as instructed, no medication side effects noted, no TIA's, no chest pain on exertion, no dyspnea on exertion and no swelling of ankles.  New concerns: None.  Objective:  BP 137/80 mmHg  Pulse 88  Temp(Src) 98 F (36.7 C)  Resp 16  Ht 5\' 11"  (1.803 m)  Wt 195 lb 3.2 oz (88.542 kg)  BMI 27.24 kg/m2  SpO2 100%  Appearance alert, well appearing, and in no distress, oriented to person, place, and time and normal appearing weight. General exam BP noted to be well controlled today in office, S1, S2 normal, no gallop, no murmur, chest clear, no JVD, no HSM, no edema.  Lab review: Bmet drawn today.   Assessment:   Hypertension well controlled and needs to follow diet more regularly.   Plan:  Current treatment plan is effective, no change in therapy. Reviewed diet, exercise and weight control. Recommended sodium restriction. Follow up: 6 months and as needed.   Lance Bosch, NP 10/09/2014 7:18 PM

## 2014-10-09 NOTE — Progress Notes (Signed)
Patient states her for routine check up on her HTN Patient also requesting refills on her medications

## 2014-10-09 NOTE — Patient Instructions (Signed)
DASH Eating Plan °DASH stands for "Dietary Approaches to Stop Hypertension." The DASH eating plan is a healthy eating plan that has been shown to reduce high blood pressure (hypertension). Additional health benefits may include reducing the risk of type 2 diabetes mellitus, heart disease, and stroke. The DASH eating plan may also help with weight loss. °WHAT DO I NEED TO KNOW ABOUT THE DASH EATING PLAN? °For the DASH eating plan, you will follow these general guidelines: °· Choose foods with a percent daily value for sodium of less than 5% (as listed on the food label). °· Use salt-free seasonings or herbs instead of table salt or sea salt. °· Check with your health care provider or pharmacist before using salt substitutes. °· Eat lower-sodium products, often labeled as "lower sodium" or "no salt added." °· Eat fresh foods. °· Eat more vegetables, fruits, and low-fat dairy products. °· Choose whole grains. Look for the word "whole" as the first word in the ingredient list. °· Choose fish and skinless chicken or turkey more often than red meat. Limit fish, poultry, and meat to 6 oz (170 g) each day. °· Limit sweets, desserts, sugars, and sugary drinks. °· Choose heart-healthy fats. °· Limit cheese to 1 oz (28 g) per day. °· Eat more home-cooked food and less restaurant, buffet, and fast food. °· Limit fried foods. °· Cook foods using methods other than frying. °· Limit canned vegetables. If you do use them, rinse them well to decrease the sodium. °· When eating at a restaurant, ask that your food be prepared with less salt, or no salt if possible. °WHAT FOODS CAN I EAT? °Seek help from a dietitian for individual calorie needs. °Grains °Whole grain or whole wheat bread. Brown rice. Whole grain or whole wheat pasta. Quinoa, bulgur, and whole grain cereals. Low-sodium cereals. Corn or whole wheat flour tortillas. Whole grain cornbread. Whole grain crackers. Low-sodium crackers. °Vegetables °Fresh or frozen vegetables  (raw, steamed, roasted, or grilled). Low-sodium or reduced-sodium tomato and vegetable juices. Low-sodium or reduced-sodium tomato sauce and paste. Low-sodium or reduced-sodium canned vegetables.  °Fruits °All fresh, canned (in natural juice), or frozen fruits. °Meat and Other Protein Products °Ground beef (85% or leaner), grass-fed beef, or beef trimmed of fat. Skinless chicken or turkey. Ground chicken or turkey. Pork trimmed of fat. All fish and seafood. Eggs. Dried beans, peas, or lentils. Unsalted nuts and seeds. Unsalted canned beans. °Dairy °Low-fat dairy products, such as skim or 1% milk, 2% or reduced-fat cheeses, low-fat ricotta or cottage cheese, or plain low-fat yogurt. Low-sodium or reduced-sodium cheeses. °Fats and Oils °Tub margarines without trans fats. Light or reduced-fat mayonnaise and salad dressings (reduced sodium). Avocado. Safflower, olive, or canola oils. Natural peanut or almond butter. °Other °Unsalted popcorn and pretzels. °The items listed above may not be a complete list of recommended foods or beverages. Contact your dietitian for more options. °WHAT FOODS ARE NOT RECOMMENDED? °Grains °White bread. White pasta. White rice. Refined cornbread. Bagels and croissants. Crackers that contain trans fat. °Vegetables °Creamed or fried vegetables. Vegetables in a cheese sauce. Regular canned vegetables. Regular canned tomato sauce and paste. Regular tomato and vegetable juices. °Fruits °Dried fruits. Canned fruit in light or heavy syrup. Fruit juice. °Meat and Other Protein Products °Fatty cuts of meat. Ribs, chicken wings, bacon, sausage, bologna, salami, chitterlings, fatback, hot dogs, bratwurst, and packaged luncheon meats. Salted nuts and seeds. Canned beans with salt. °Dairy °Whole or 2% milk, cream, half-and-half, and cream cheese. Whole-fat or sweetened yogurt. Full-fat   cheeses or blue cheese. Nondairy creamers and whipped toppings. Processed cheese, cheese spreads, or cheese  curds. °Condiments °Onion and garlic salt, seasoned salt, table salt, and sea salt. Canned and packaged gravies. Worcestershire sauce. Tartar sauce. Barbecue sauce. Teriyaki sauce. Soy sauce, including reduced sodium. Steak sauce. Fish sauce. Oyster sauce. Cocktail sauce. Horseradish. Ketchup and mustard. Meat flavorings and tenderizers. Bouillon cubes. Hot sauce. Tabasco sauce. Marinades. Taco seasonings. Relishes. °Fats and Oils °Butter, stick margarine, lard, shortening, ghee, and bacon fat. Coconut, palm kernel, or palm oils. Regular salad dressings. °Other °Pickles and olives. Salted popcorn and pretzels. °The items listed above may not be a complete list of foods and beverages to avoid. Contact your dietitian for more information. °WHERE CAN I FIND MORE INFORMATION? °National Heart, Lung, and Blood Institute: www.nhlbi.nih.gov/health/health-topics/topics/dash/ °Document Released: 01/08/2011 Document Revised: 06/05/2013 Document Reviewed: 11/23/2012 °ExitCare® Patient Information ©2015 Exit[employer], [employer]. This information is not intended to replace advice given to you by your health care provider. Make sure you discuss any questions you have with your health care provider. ° °

## 2014-10-18 ENCOUNTER — Telehealth: Payer: Self-pay

## 2014-10-18 NOTE — Telephone Encounter (Signed)
Spoke with patient this am and she is aware of her normal lab results

## 2014-10-18 NOTE — Telephone Encounter (Signed)
-----   Message from Lance Bosch, NP sent at 10/17/2014 10:06 PM EDT ----- Labs normal

## 2014-12-11 ENCOUNTER — Ambulatory Visit: Payer: BLUE CROSS/BLUE SHIELD | Attending: Internal Medicine

## 2014-12-11 VITALS — BP 133/75 | HR 68 | Temp 97.7°F | Resp 18 | Ht 69.0 in | Wt 201.0 lb

## 2014-12-11 DIAGNOSIS — Z0000 Encounter for general adult medical examination without abnormal findings: Secondary | ICD-10-CM | POA: Insufficient documentation

## 2014-12-11 NOTE — Progress Notes (Signed)
Patient here for TB shot, reports feeling good.

## 2014-12-13 ENCOUNTER — Ambulatory Visit: Payer: BLUE CROSS/BLUE SHIELD | Attending: Internal Medicine

## 2014-12-13 DIAGNOSIS — Z111 Encounter for screening for respiratory tuberculosis: Secondary | ICD-10-CM | POA: Insufficient documentation

## 2014-12-13 LAB — TB SKIN TEST
Induration: 0 mm
TB SKIN TEST: NEGATIVE

## 2014-12-13 NOTE — Progress Notes (Signed)
Patient here for ppd read.  Ppd given on 12/11/14. Ppd negative. Letter given to patient.

## 2015-02-03 HISTORY — PX: WISDOM TOOTH EXTRACTION: SHX21

## 2015-02-06 ENCOUNTER — Encounter: Payer: Self-pay | Admitting: Internal Medicine

## 2015-02-06 ENCOUNTER — Ambulatory Visit: Payer: BLUE CROSS/BLUE SHIELD | Attending: Internal Medicine | Admitting: Internal Medicine

## 2015-02-06 VITALS — BP 130/70 | HR 95 | Temp 98.3°F | Resp 18 | Ht 71.0 in | Wt 197.0 lb

## 2015-02-06 DIAGNOSIS — H109 Unspecified conjunctivitis: Secondary | ICD-10-CM | POA: Diagnosis not present

## 2015-02-06 DIAGNOSIS — Z882 Allergy status to sulfonamides status: Secondary | ICD-10-CM | POA: Insufficient documentation

## 2015-02-06 DIAGNOSIS — J069 Acute upper respiratory infection, unspecified: Secondary | ICD-10-CM

## 2015-02-06 DIAGNOSIS — I10 Essential (primary) hypertension: Secondary | ICD-10-CM | POA: Insufficient documentation

## 2015-02-06 MED ORDER — AMOXICILLIN-POT CLAVULANATE 875-125 MG PO TABS
1.0000 | ORAL_TABLET | Freq: Two times a day (BID) | ORAL | Status: DC
Start: 2015-02-06 — End: 2016-03-27

## 2015-02-06 MED ORDER — OFLOXACIN 0.3 % OP SOLN: 1.0000 [drp] | Freq: Four times a day (QID) | OPHTHALMIC | Status: DC

## 2015-02-06 MED ORDER — FLUTICASONE PROPIONATE 50 MCG/ACT NA SUSP: 2.0000 | Freq: Every day | NASAL | Status: DC

## 2015-02-06 MED FILL — OFLOXACIN 0.3% EYE DROPS: 0.3 | 7 days supply | Qty: 5 | Fill #0

## 2015-02-06 MED FILL — AMOX-CLAV 875-125 MG TABLET: 875-125 | 10 days supply | Qty: 20 | Fill #0

## 2015-02-06 MED FILL — FLUTICASONE PROP 50 MCG SPR: 50 | 30 days supply | Qty: 16 | Fill #0

## 2015-02-06 NOTE — Progress Notes (Signed)
Patient ID: Kaylee Nelson, female   DOB: 02/02/1956, 60 y.o.   MRN: [id number]  CC: pink eyes, cold sx  HPI: Kaylee Nelson is a 60 y.o. female here today for a follow up visit.  Patient has past medical history of hypertension. 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 She states that she has had symptoms of rhinitis, cough, and nasal congestion for 1 month. She has tried OTC cough syrups with little relief. Cough is worse at bedtime. For the past 2 days she has noticed symptoms of itching, watery eyes that are red and "glued shut" in the mornings upon awakening. Eye drainage is white. She is worried she may have contracted "pink eye" from one of the children.  She denies fever and chills.   Allergies  Allergen Reactions  . Sulfa Antibiotics    Past Medical History  Diagnosis Date  . Hypertension    Current Outpatient Prescriptions on File Prior to Visit  Medication Sig Dispense Refill  . amLODipine-valsartan (EXFORGE) 10-160 MG per tablet Take 1 tablet by mouth daily. 30 tablet 6  . fluticasone (FLONASE) 50 MCG/ACT nasal spray Place 2 sprays into both nostrils daily. 16 g 6  . hydrochlorothiazide (MICROZIDE) 12.5 MG capsule Take 1 capsule (12.5 mg total) by mouth daily. 30 capsule 6  . loratadine (CLARITIN) 10 MG tablet Take 1 tablet (10 mg total) by mouth daily. 30 tablet 11  . Vitamin D, Ergocalciferol, (DRISDOL) 50000 UNITS CAPS capsule Take 50,000 Units by mouth every 7 (seven) days. Reported on 02/06/2015     No current facility-administered medications on file prior to visit.   Family History  Problem Relation Age of Onset  . Cancer Mother   . Cancer Daughter   . Cancer Maternal Aunt    Social History   Social History  . Marital Status: Married    Spouse Name: N/A  . Number of Children: N/A  . Years of Education: N/A   Occupational History  . Not on file.   Social History Main Topics  . Smoking status: Never Smoker   .  Smokeless tobacco: Not on file  . Alcohol Use: No  . Drug Use: No  . Sexual Activity: No   Other Topics Concern  . Not on file   Social History Narrative    Review of Systems: Other than what is stated in HPI, all other systems are negative.   Objective:   Filed Vitals:   02/06/15 1442  BP: 130/70  Pulse: 95  Temp: 98.3 F (36.8 C)  Resp: 18    Physical Exam  HENT:  Mouth/Throat: Oropharynx is clear and moist.  Right swollen nasal turbinate  Eyes: EOM are normal. Pupils are equal, round, and reactive to light. Right eye exhibits discharge. Left eye exhibits discharge.  Allergic shiners under bilateral eyes  Redness of left eye with some erythema   Cardiovascular: Normal rate, regular rhythm and normal heart sounds.   Pulmonary/Chest: Effort normal.  Lymphadenopathy:    She has no cervical adenopathy.  Psychiatric: She has a normal mood and affect.     Lab Results  Component Value Date   WBC 4.0 01/10/2014   HGB 11.8* 01/10/2014   HCT 34.0* 01/10/2014   MCV 76.1* 01/10/2014   PLT 233 01/10/2014   Lab Results  Component Value Date   CREATININE 0.70 10/09/2014   BUN 11 10/09/2014   NA 143 10/09/2014   K 3.9 10/09/2014   CL 103  10/09/2014   CO2 33* 10/09/2014    No results found for: HGBA1C Lipid Panel     Component Value Date/Time   CHOL 193 01/10/2014 1253   TRIG 48 01/10/2014 1253   HDL 61 01/10/2014 1253   CHOLHDL 3.2 01/10/2014 1253   VLDL 10 01/10/2014 1253   LDLCALC 122* 01/10/2014 1253       Assessment and plan:   Kaylee Nelson was seen today for conjunctivitis and uri.  Diagnoses and all orders for this visit:  URI (upper respiratory infection) -     fluticasone (FLONASE) 50 MCG/ACT nasal spray; Place 2 sprays into both nostrils daily. -     amoxicillin-clavulanate (AUGMENTIN) 875-125 MG tablet; Take 1 tablet by mouth 2 (two) times daily.  Conjunctivitis of left eye -     ofloxacin (OCUFLOX) 0.3 % ophthalmic solution; Place 1 drop  into the left eye 4 (four) times daily. For 7 days. 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 I have encouraged patient to use claritin daily as well.  Return if symptoms worsen or fail to improve.       Lance Bosch, [place] [facility] [phone] 02/06/2015, 3:32 PM

## 2015-02-06 NOTE — Patient Instructions (Signed)
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 Please take your Claritin daily with the Flonase.  I would advise you to stay out of work for the next 2 days. Wash hand thoroughly to prevent spread.

## 2015-02-06 NOTE — Progress Notes (Signed)
C/C cold Sx and possible pink eye Cold Sx x 1 month/ pink eye x 3 days  No tobacco user  No pain today  No Suicidal thought in the past two weeks

## 2015-03-04 MED FILL — ?HYDROCHLOROTHIAZIDE 12.5MG: 12.5 | 30 days supply | Qty: 30 | Fill #2

## 2015-03-04 MED FILL — AMLODIPINE-VALSARTAN 10-160: 10-160 | 30 days supply | Qty: 30 | Fill #3

## 2015-04-04 MED FILL — ?HYDROCHLOROTHIAZIDE 12.5MG: 12.5 | 30 days supply | Qty: 30 | Fill #3

## 2015-04-04 MED FILL — AMLODIPINE-VALSARTAN 10-160: 10-160 | 30 days supply | Qty: 30 | Fill #4

## 2015-05-10 MED FILL — ?HYDROCHLOROTHIAZIDE 12.5MG: 12.5 | 30 days supply | Qty: 30 | Fill #4

## 2015-05-10 MED FILL — AMLODIPINE-VALSARTAN 10-160: 10-160 | 30 days supply | Qty: 30 | Fill #5

## 2015-06-12 MED FILL — AMLODIPINE-VALSARTAN 10-160: 10-160 | 30 days supply | Qty: 30 | Fill #6

## 2015-06-12 MED FILL — HYDROCHLOROTHIAZIDE 12.5 MG: 12.5 | 30 days supply | Qty: 30 | Fill #5

## 2015-07-25 MED FILL — AMLODIPINE-VALSARTAN 10-160: 10-160 | 30 days supply | Qty: 30 | Fill #0

## 2015-07-25 MED FILL — HYDROCHLOROTHIAZIDE 12.5 MG: 12.5 | 30 days supply | Qty: 30 | Fill #6

## 2015-09-04 ENCOUNTER — Other Ambulatory Visit: Payer: Self-pay | Admitting: Internal Medicine

## 2015-09-04 DIAGNOSIS — I10 Essential (primary) hypertension: Secondary | ICD-10-CM

## 2015-09-04 MED FILL — AMLODIPINE-VALSARTAN 10-160: 10-160 | 30 days supply | Qty: 30 | Fill #0

## 2015-09-04 MED FILL — HYDROCHLOROTHIAZIDE 12.5 MG: 12.5 | 30 days supply | Qty: 30 | Fill #0

## 2015-09-23 ENCOUNTER — Ambulatory Visit: Payer: BLUE CROSS/BLUE SHIELD | Admitting: Family Medicine

## 2015-10-08 ENCOUNTER — Other Ambulatory Visit: Payer: Self-pay | Admitting: Internal Medicine

## 2015-10-08 DIAGNOSIS — I10 Essential (primary) hypertension: Secondary | ICD-10-CM

## 2015-10-08 MED FILL — HYDROCHLOROTHIAZIDE 12.5 MG: 12.5 | 30 days supply | Qty: 30 | Fill #0

## 2015-10-09 ENCOUNTER — Ambulatory Visit: Payer: BLUE CROSS/BLUE SHIELD | Admitting: Family Medicine

## 2015-10-15 MED FILL — AMLODIPINE-VALSARTAN 10-320: 10-320 | 30 days supply | Qty: 30 | Fill #0

## 2015-11-20 ENCOUNTER — Other Ambulatory Visit: Payer: Self-pay | Admitting: Internal Medicine

## 2015-11-20 DIAGNOSIS — I10 Essential (primary) hypertension: Secondary | ICD-10-CM

## 2015-11-27 ENCOUNTER — Telehealth: Payer: Self-pay | Admitting: Internal Medicine

## 2015-11-27 DIAGNOSIS — I10 Essential (primary) hypertension: Secondary | ICD-10-CM

## 2015-11-27 MED ORDER — AMLODIPINE BESYLATE-VALSARTAN 10-160 MG PO TABS: 1.0000 | ORAL_TABLET | Freq: Every day | ORAL | 0 refills | Status: DC

## 2015-11-27 MED ORDER — HYDROCHLOROTHIAZIDE 12.5 MG PO CAPS: 12.5000 mg | ORAL_CAPSULE | Freq: Every day | ORAL | 0 refills | Status: DC

## 2015-11-27 NOTE — Telephone Encounter (Signed)
Refilled blood pressure medications but did not refill the antibiotic for her eye - she must been seen in clinic and evaluated before that can be refilled.

## 2015-11-27 NOTE — Telephone Encounter (Signed)
Patient called requesting refills on medications: amLODipine-valsartan (EXFORGE) 10-160 MG tablet   hydrochlorothiazide (MICROZIDE) 12.5 MG capsule  ofloxacin (OCUFLOX) 0.3 % ophthalmic solution  Patient also called to schedule appt. And Appt was scheduled. Please F/U

## 2015-12-11 ENCOUNTER — Encounter: Payer: Self-pay | Admitting: Family Medicine

## 2015-12-11 ENCOUNTER — Other Ambulatory Visit: Payer: Self-pay | Admitting: Family Medicine

## 2015-12-11 ENCOUNTER — Ambulatory Visit: Payer: 59 | Attending: Family Medicine | Admitting: Family Medicine

## 2015-12-11 VITALS — BP 182/96 | HR 83 | Temp 98.0°F | Resp 20 | Ht 71.0 in | Wt 210.0 lb

## 2015-12-11 DIAGNOSIS — R51 Headache: Secondary | ICD-10-CM | POA: Insufficient documentation

## 2015-12-11 DIAGNOSIS — Z79899 Other long term (current) drug therapy: Secondary | ICD-10-CM | POA: Insufficient documentation

## 2015-12-11 DIAGNOSIS — E559 Vitamin D deficiency, unspecified: Secondary | ICD-10-CM | POA: Insufficient documentation

## 2015-12-11 DIAGNOSIS — Z9889 Other specified postprocedural states: Secondary | ICD-10-CM | POA: Diagnosis not present

## 2015-12-11 DIAGNOSIS — R519 Headache, unspecified: Secondary | ICD-10-CM

## 2015-12-11 DIAGNOSIS — Z1159 Encounter for screening for other viral diseases: Secondary | ICD-10-CM | POA: Insufficient documentation

## 2015-12-11 DIAGNOSIS — I10 Essential (primary) hypertension: Secondary | ICD-10-CM | POA: Insufficient documentation

## 2015-12-11 MED ORDER — CETIRIZINE HCL 10 MG PO TABS: 10.0000 mg | ORAL_TABLET | Freq: Every day | ORAL | 5 refills | Status: DC

## 2015-12-11 MED ORDER — HYDROCHLOROTHIAZIDE 12.5 MG PO CAPS: 12.5000 mg | ORAL_CAPSULE | Freq: Every day | ORAL | 5 refills | Status: DC

## 2015-12-11 MED ORDER — AMLODIPINE BESYLATE-VALSARTAN 10-160 MG PO TABS: 1.0000 | ORAL_TABLET | Freq: Every day | ORAL | 5 refills | Status: DC

## 2015-12-11 NOTE — Progress Notes (Signed)
Patient here for BP.  Last does of BP med, 2 weeks ago. Refill amlodipine and HCTZ and Vit D

## 2015-12-11 NOTE — Progress Notes (Signed)
Subjective:  Patient ID: Kaylee Nelson, female    DOB: 1955-05-29  Age: 60 y.o. MRN: [id number]  CC: Hypertension   HPI Kaylee Nelson is a 60 year old lady with a history of hypertension who presents today for refill of medications as she has run out of all her antihypertensive therapy resulting elevated blood pressure.  She was previously followed with apractitioner who is no longer with our practice and would like to get established with me. She complains of intermittent left frontal headache for the last 1 week but denies nasal congestion, rhinorrhea, sinus tenderness or postnasal drip and has not had a fever.  Denies shortness of breath, wheezing or chest pains.  Past Medical History:  Diagnosis Date  . Hypertension     Past Surgical History:  Procedure Laterality Date  . CESAREAN SECTION      Allergies  Allergen Reactions  . Sulfa Antibiotics       Outpatient Medications Prior to Visit  Medication Sig Dispense Refill  . amoxicillin-clavulanate (AUGMENTIN) 875-125 MG tablet Take 1 tablet by mouth 2 (two) times daily. 20 tablet 0  . loratadine (CLARITIN) 10 MG tablet Take 1 tablet (10 mg total) by mouth daily. 30 tablet 11  . Vitamin D, Ergocalciferol, (DRISDOL) 50000 UNITS CAPS capsule Take 50,000 Units by mouth every 7 (seven) days. Reported on 02/06/2015    . amLODipine-valsartan (EXFORGE) 10-160 MG tablet Take 1 tablet by mouth daily. 30 tablet 0  . hydrochlorothiazide (MICROZIDE) 12.5 MG capsule Take 1 capsule (12.5 mg total) by mouth daily. 30 capsule 0  . fluticasone (FLONASE) 50 MCG/ACT nasal spray Place 2 sprays into both nostrils daily. (Patient not taking: Reported on 12/11/2015) 16 g 6  . ofloxacin (OCUFLOX) 0.3 % ophthalmic solution Place 1 drop into the left eye 4 (four) times daily. For 7 days. (Patient not taking: Reported on 12/11/2015) 5 mL 0   No facility-administered medications prior to visit.     ROS Review of Systems  Constitutional: Negative  for activity change, appetite change and fatigue.  HENT: Negative for congestion, sinus pressure and sore throat.   Eyes: Negative for visual disturbance.  Respiratory: Negative for cough, chest tightness, shortness of breath and wheezing.   Cardiovascular: Negative for chest pain and palpitations.  Gastrointestinal: Negative for abdominal distention, abdominal pain and constipation.  Endocrine: Negative for polydipsia.  Genitourinary: Negative for dysuria and frequency.  Musculoskeletal: Negative for arthralgias and back pain.  Skin: Negative for rash.  Neurological: Positive for headaches. Negative for tremors, light-headedness and numbness.  Hematological: Does not bruise/bleed easily.  Psychiatric/Behavioral: Negative for agitation and behavioral problems.    Objective:  BP (!) 182/96   Pulse 83   Temp 98 F (36.7 C) (Oral)   Resp 20   Ht 5\' 11"  (1.803 m)   Wt 210 lb (95.3 kg)   SpO2 98%   BMI 29.29 kg/m   BP/Weight 12/11/2015 02/06/2015 0000000  Systolic BP [id number] [id number]  Diastolic BP 96 70 75  Wt. (Lbs) 210 197 201  BMI 29.29 27.49 29.67      Physical Exam  Constitutional: She is oriented to person, place, and time. She appears well-developed and well-nourished.  Eyes:  Bilateral infraorbital edema with associated hyperpigmentation  Cardiovascular: Normal rate, normal heart sounds and intact distal pulses.   No murmur heard. Pulmonary/Chest: Effort normal and breath sounds normal. She has no wheezes. She has no rales. She exhibits no tenderness.  Abdominal: Soft. Bowel sounds are normal. She exhibits no distension  and no mass. There is no tenderness.  Musculoskeletal: Normal range of motion. She exhibits tenderness (1+ pitting pedal edema of the left ankle).  Neurological: She is alert and oriented to person, place, and time.     Assessment & Plan:   1. Essential hypertension Uncontrolled due to running out of medications Low-sodium diet - amLODipine-valsartan  (EXFORGE) 10-160 MG tablet; Take 1 tablet by mouth daily.  Dispense: 30 tablet; Refill: 5 - hydrochlorothiazide (MICROZIDE) 12.5 MG capsule; Take 1 capsule (12.5 mg total) by mouth daily.  Dispense: 30 capsule; Refill: 5 - COMPLETE METABOLIC PANEL WITH GFR; Future - Lipid panel; Future  2. Acute nonintractable headache, unspecified headache type We'll treat her sinus headache - cetirizine (ZYRTEC) 10 MG tablet; Take 1 tablet (10 mg total) by mouth daily.  Dispense: 30 tablet; Refill: 5  3. Vitamin D deficiency Previous history of vitamin D deficiency We'll recheck levels - Vitamin D, 25-hydroxy; Future  4.Screening for viral diseases Hepatitis C antibody, HIV antibody  Meds ordered this encounter  Medications  . amLODipine-valsartan (EXFORGE) 10-160 MG tablet    Sig: Take 1 tablet by mouth daily.    Dispense:  30 tablet    Refill:  5  . hydrochlorothiazide (MICROZIDE) 12.5 MG capsule    Sig: Take 1 capsule (12.5 mg total) by mouth daily.    Dispense:  30 capsule    Refill:  5  . cetirizine (ZYRTEC) 10 MG tablet    Sig: Take 1 tablet (10 mg total) by mouth daily.    Dispense:  30 tablet    Refill:  5    Follow-up: Return in about 3 weeks (around 01/01/2016) for Complete physical exam.   Arnoldo Morale MD

## 2015-12-12 ENCOUNTER — Other Ambulatory Visit: Payer: Self-pay | Admitting: Family Medicine

## 2015-12-12 LAB — VITAMIN D 25 HYDROXY (VIT D DEFICIENCY, FRACTURES): VIT D 25 HYDROXY: 20 ng/mL — AB (ref 30–100)

## 2015-12-12 LAB — HEPATITIS C ANTIBODY: HCV AB: NEGATIVE

## 2015-12-12 LAB — HIV ANTIBODY (ROUTINE TESTING W REFLEX): HIV 1&2 Ab, 4th Generation: NONREACTIVE

## 2015-12-12 MED ORDER — VITAMIN D (ERGOCALCIFEROL) 1.25 MG (50000 UNIT) PO CAPS: 50000.0000 [IU] | ORAL_CAPSULE | ORAL | 0 refills | Status: DC

## 2015-12-16 ENCOUNTER — Telehealth: Payer: Self-pay

## 2015-12-16 NOTE — Telephone Encounter (Signed)
Writer LVM requesting patient to call back regarding her lab results.

## 2015-12-16 NOTE — Telephone Encounter (Signed)
-----   Message from Arnoldo Morale, MD sent at 12/12/2015  2:10 PM EST ----- Labs revealed low vitamin D levels. I have sent a refill for replacement of her pharmacy.

## 2015-12-17 ENCOUNTER — Ambulatory Visit: Payer: 59 | Attending: Internal Medicine

## 2015-12-17 DIAGNOSIS — I10 Essential (primary) hypertension: Secondary | ICD-10-CM | POA: Insufficient documentation

## 2015-12-17 LAB — COMPLETE METABOLIC PANEL WITH GFR
ALT: 14 U/L (ref 6–29)
AST: 19 U/L (ref 10–35)
Albumin: 4 g/dL (ref 3.6–5.1)
Alkaline Phosphatase: 78 U/L (ref 33–130)
BILIRUBIN TOTAL: 0.7 mg/dL (ref 0.2–1.2)
BUN: 9 mg/dL (ref 7–25)
CO2: 33 mmol/L — AB (ref 20–31)
Calcium: 9.6 mg/dL (ref 8.6–10.4)
Chloride: 105 mmol/L (ref 98–110)
Creat: 0.65 mg/dL (ref 0.50–1.05)
GFR, Est African American: >89 mL/min (ref >=60)
GLUCOSE: 91 mg/dL (ref 65–99)
POTASSIUM: 3.3 mmol/L — AB (ref 3.5–5.3)
SODIUM: 146 mmol/L (ref 135–146)
TOTAL PROTEIN: 6.9 g/dL (ref 6.1–8.1)

## 2015-12-17 LAB — LIPID PANEL
CHOL/HDL RATIO: 2.8 ratio (ref <5.0)
Cholesterol: 200 mg/dL — ABNORMAL HIGH (ref <200)
HDL: 71 mg/dL (ref >50)
LDL CALC: 119 mg/dL — AB (ref <100)
Triglycerides: 51 mg/dL (ref <150)
VLDL: 10 mg/dL (ref <30)

## 2015-12-17 NOTE — Progress Notes (Signed)
Patient here for lab visit only 

## 2015-12-18 ENCOUNTER — Telehealth: Payer: Self-pay

## 2015-12-18 ENCOUNTER — Telehealth: Payer: Self-pay | Admitting: Family Medicine

## 2015-12-18 ENCOUNTER — Other Ambulatory Visit: Payer: Self-pay | Admitting: Family Medicine

## 2015-12-18 DIAGNOSIS — E785 Hyperlipidemia, unspecified: Secondary | ICD-10-CM

## 2015-12-18 DIAGNOSIS — E7800 Pure hypercholesterolemia, unspecified: Secondary | ICD-10-CM

## 2015-12-18 HISTORY — DX: Hyperlipidemia, unspecified: E78.5

## 2015-12-18 MED ORDER — ATORVASTATIN CALCIUM 20 MG PO TABS: 20.0000 mg | ORAL_TABLET | Freq: Every day | ORAL | 3 refills | Status: DC

## 2015-12-18 NOTE — Telephone Encounter (Signed)
Writer spoke with patient about his potassium findings and discussed potassium enriched foods.  Patient stated understanding about her findings.

## 2015-12-18 NOTE — Telephone Encounter (Signed)
Patient called the office regarding a missed call. Please call patient at [phone]. Patient gave consent to leave msg if she doesn't respond.   Thank you.

## 2015-12-18 NOTE — Telephone Encounter (Signed)
Writer spoke with patient per Dr. Jarold Song regarding lab results.  Patient stated understanding and will pick up her Vitamin D.

## 2015-12-18 NOTE — Telephone Encounter (Signed)
-----   Message from Arnoldo Morale, MD sent at 12/18/2015  2:01 PM EST ----- Potassium is slightly decreased and I will encourage her to increase intake of bananas and potassium rich foods. Lipids are elevated and I have sent a prescription for atorvastatin to the pharmacy

## 2016-01-07 NOTE — Telephone Encounter (Signed)
Patient would like medications sent to [facility] [place], [place]

## 2016-01-10 ENCOUNTER — Telehealth: Payer: Self-pay

## 2016-01-10 NOTE — Telephone Encounter (Signed)
Writer returning patient's call.  LVM asking patient to call back regarding questions and refills.

## 2016-01-23 NOTE — Telephone Encounter (Signed)
New pharmacy added to patient preferred pharmacy list.

## 2016-03-27 ENCOUNTER — Encounter: Payer: Self-pay | Admitting: Family Medicine

## 2016-03-27 ENCOUNTER — Ambulatory Visit: Payer: 59 | Attending: Family Medicine | Admitting: Family Medicine

## 2016-03-27 VITALS — BP 113/65 | HR 72 | Temp 98.2°F | Ht 70.0 in | Wt 204.2 lb

## 2016-03-27 DIAGNOSIS — Z9889 Other specified postprocedural states: Secondary | ICD-10-CM | POA: Diagnosis not present

## 2016-03-27 DIAGNOSIS — Z79899 Other long term (current) drug therapy: Secondary | ICD-10-CM | POA: Insufficient documentation

## 2016-03-27 DIAGNOSIS — H1013 Acute atopic conjunctivitis, bilateral: Secondary | ICD-10-CM | POA: Insufficient documentation

## 2016-03-27 DIAGNOSIS — J320 Chronic maxillary sinusitis: Secondary | ICD-10-CM | POA: Diagnosis present

## 2016-03-27 DIAGNOSIS — E876 Hypokalemia: Secondary | ICD-10-CM | POA: Diagnosis not present

## 2016-03-27 DIAGNOSIS — R51 Headache: Secondary | ICD-10-CM | POA: Insufficient documentation

## 2016-03-27 DIAGNOSIS — R05 Cough: Secondary | ICD-10-CM | POA: Insufficient documentation

## 2016-03-27 DIAGNOSIS — I10 Essential (primary) hypertension: Secondary | ICD-10-CM | POA: Diagnosis not present

## 2016-03-27 LAB — BASIC METABOLIC PANEL
BUN: 11 mg/dL (ref 7–25)
CO2: 32 mmol/L — ABNORMAL HIGH (ref 20–31)
CREATININE: 0.7 mg/dL (ref 0.50–1.05)
Calcium: 9.1 mg/dL (ref 8.6–10.4)
Chloride: 103 mmol/L (ref 98–110)
GLUCOSE: 89 mg/dL (ref 65–99)
POTASSIUM: 2.9 mmol/L — AB (ref 3.5–5.3)
SODIUM: 142 mmol/L (ref 135–146)

## 2016-03-27 MED ORDER — OLOPATADINE HCL 0.1 % OP SOLN: 1.0000 [drp] | Freq: Two times a day (BID) | OPHTHALMIC | 1 refills | Status: DC

## 2016-03-27 MED ORDER — AMOXICILLIN 500 MG PO CAPS: 500.0000 mg | ORAL_CAPSULE | Freq: Three times a day (TID) | ORAL | 0 refills | Status: DC

## 2016-03-27 MED ORDER — CETIRIZINE HCL 10 MG PO TABS: 10.0000 mg | ORAL_TABLET | Freq: Every day | ORAL | 2 refills | Status: DC

## 2016-03-27 NOTE — Progress Notes (Signed)
Subjective:  Patient ID: Kaylee Nelson, female    DOB: [birth year]  Age: 61 y.o. MRN: [id number]  CC: Headache; red eyes; Nasal Congestion; and Hypertension   HPI Kaylee Nelson is a 61 year old female with a history of hypertension who presents with a two-week history of headaches, red eyes, nasal congestion and infraorbital edema. She denies fevers but endorses 'pressure like sensation in her head' and postnasal drip. She has been coughing a lot with production of light yellowish mucus.  She remains on her antihypertensives and has been compliant with them.  Review of last set of labs indicate a hypokalemia 3.3.  Past Medical History:  Diagnosis Date  . Hypertension     Past Surgical History:  Procedure Laterality Date  . CESAREAN SECTION        Outpatient Medications Prior to Visit  Medication Sig Dispense Refill  . amLODipine-valsartan (EXFORGE) 10-160 MG tablet Take 1 tablet by mouth daily. 30 tablet 5  . hydrochlorothiazide (MICROZIDE) 12.5 MG capsule Take 1 capsule (12.5 mg total) by mouth daily. 30 capsule 5  . Vitamin D, Ergocalciferol, (DRISDOL) 50000 units CAPS capsule Take 1 capsule (50,000 Units total) by mouth every 7 (seven) days. 60 capsule 0  . atorvastatin (LIPITOR) 20 MG tablet Take 1 tablet (20 mg total) by mouth daily. (Patient not taking: Reported on 03/27/2016) 30 tablet 3  . fluticasone (FLONASE) 50 MCG/ACT nasal spray Place 2 sprays into both nostrils daily. (Patient not taking: Reported on 12/11/2015) 16 g 6  . amoxicillin-clavulanate (AUGMENTIN) 875-125 MG tablet Take 1 tablet by mouth 2 (two) times daily. 20 tablet 0  . cetirizine (ZYRTEC) 10 MG tablet Take 1 tablet (10 mg total) by mouth daily. (Patient not taking: Reported on 03/27/2016) 30 tablet 5  . loratadine (CLARITIN) 10 MG tablet Take 1 tablet (10 mg total) by mouth daily. (Patient not taking: Reported on 03/27/2016) 30 tablet 11  . ofloxacin (OCUFLOX) 0.3 % ophthalmic solution Place 1 drop  into the left eye 4 (four) times daily. For 7 days. (Patient not taking: Reported on 12/11/2015) 5 mL 0   No facility-administered medications prior to visit.     ROS Review of Systems  Constitutional: Negative for activity change and appetite change.  HENT: Negative for sinus pressure and sore throat.   Respiratory: Negative for chest tightness, shortness of breath and wheezing.   Cardiovascular: Negative for chest pain and palpitations.  Gastrointestinal: Negative for abdominal distention, abdominal pain and constipation.  Genitourinary: Negative.   Musculoskeletal: Negative.   Psychiatric/Behavioral: Negative for behavioral problems and dysphoric mood.    Objective:  BP 113/65 (BP Location: Right Arm, Patient Position: Sitting, Cuff Size: Small)   Pulse 72   Temp 98.2 F (36.8 C) (Oral)   Ht 5\' 10"  (1.778 m)   Wt 204 lb 3.2 oz (92.6 kg)   SpO2 99%   BMI 29.30 kg/m   BP/Weight 03/27/2016 [id number]  Systolic BP 123456 [id number]  Diastolic BP 65 96 70  Wt. (Lbs) 204.2 210 197  BMI 29.3 29.29 27.49      Physical Exam  Constitutional: She is oriented to person, place, and time. She appears well-developed and well-nourished.  HENT:  Right Ear: External ear normal.  Left Ear: External ear normal.  Mouth/Throat: Oropharynx is clear and moist.  Eyes:  Bilateral infraorbital edema Slightly erythematous conjunctiva and sclera  Cardiovascular: Normal rate, normal heart sounds and intact distal pulses.   No murmur heard. Pulmonary/Chest: Effort normal and breath sounds normal.  She has no wheezes. She has no rales. She exhibits no tenderness.  Abdominal: Soft. Bowel sounds are normal. She exhibits no distension and no mass. There is no tenderness.  Musculoskeletal: Normal range of motion.  Neurological: She is alert and oriented to person, place, and time.   BMP Latest Ref Rng & Units 12/17/2015 10/09/2014 01/10/2014  Glucose 65 - 99 mg/dL 91 108(H) 88  BUN 7 - 25 mg/dL 9  11 10   Creatinine 0.50 - 1.05 mg/dL 0.65 0.70 0.63  Sodium 135 - 146 mmol/L 146 143 146(H)  Potassium 3.5 - 5.3 mmol/L 3.3(L) 3.9 4.1  Chloride 98 - 110 mmol/L 105 103 107  CO2 20 - 31 mmol/L 33(H) 33(H) 30  Calcium 8.6 - 10.4 mg/dL 9.6 9.6 9.5     Assessment & Plan:   1. Chronic maxillary sinusitis - cetirizine (ZYRTEC) 10 MG tablet; Take 1 tablet (10 mg total) by mouth daily.  Dispense: 30 tablet; Refill: 2 - amoxicillin (AMOXIL) 500 MG capsule; Take 1 capsule (500 mg total) by mouth 3 (three) times daily.  Dispense: 30 capsule; Refill: 0  2. Allergic conjunctivitis of both eyes - olopatadine (PATANOL) 0.1 % ophthalmic solution; Place 1 drop into both eyes 2 (two) times daily.  Dispense: 5 mL; Refill: 1  3. Hypokalemia - Basic Metabolic Panel   Meds ordered this encounter  Medications  . cetirizine (ZYRTEC) 10 MG tablet    Sig: Take 1 tablet (10 mg total) by mouth daily.    Dispense:  30 tablet    Refill:  2  . amoxicillin (AMOXIL) 500 MG capsule    Sig: Take 1 capsule (500 mg total) by mouth 3 (three) times daily.    Dispense:  30 capsule    Refill:  0  . olopatadine (PATANOL) 0.1 % ophthalmic solution    Sig: Place 1 drop into both eyes 2 (two) times daily.    Dispense:  5 mL    Refill:  1    Follow-up: Return in about 1 month (around 04/24/2016) for Follow-up on chronic medical conditions.   Arnoldo Morale MD

## 2016-03-30 ENCOUNTER — Other Ambulatory Visit: Payer: Self-pay | Admitting: Family Medicine

## 2016-03-30 MED ORDER — POTASSIUM CHLORIDE CRYS ER 20 MEQ PO TBCR: 20.0000 meq | EXTENDED_RELEASE_TABLET | Freq: Two times a day (BID) | ORAL | 0 refills | Status: DC

## 2016-03-30 MED ORDER — CARVEDILOL 3.125 MG PO TABS: 3.1250 mg | ORAL_TABLET | Freq: Two times a day (BID) | ORAL | 3 refills | Status: DC

## 2016-03-31 ENCOUNTER — Telehealth: Payer: Self-pay

## 2016-03-31 NOTE — Telephone Encounter (Signed)
-----   Message from Arnoldo Morale, MD sent at 03/30/2016 11:53 AM EST ----- Potassium is severely low at 2.9, this could cause muscle weakness and cardiac irregularities; attempted calling the patient and was unable to reach her. I have sent a prescription for potassium replacement to her pharmacy and would like her to discontinue hydrochlorothiazide which could be the culprit (if she had diarrhea that could explain it as well) and I have replaced this with another antihypertensive-Coreg.Will repeat labs at her next visit.

## 2016-03-31 NOTE — Telephone Encounter (Signed)
Writer called patient on both provided numbers and LVM requesting a call back from patient today regarding her lab abnormalities.

## 2016-04-01 ENCOUNTER — Telehealth: Payer: Self-pay | Admitting: Family Medicine

## 2016-04-01 ENCOUNTER — Telehealth: Payer: Self-pay

## 2016-04-01 NOTE — Telephone Encounter (Signed)
Patient returned nurse's call regarding her lab result. Pt asked that you please call her at house number. She will be there until 11:30am.  Thank you.

## 2016-04-01 NOTE — Telephone Encounter (Signed)
Writer was finally able to speak with patient after leaving many messages.  Patient state dunderstanding regarding lab results and stopping the HCTZ- replacing it with coreg as well as taking a potassium supplement. Patient stated that she has had a lot of leg cramping and will call to make an appt if the cramping doesn't go away with taking the potassium supplement.

## 2016-04-01 NOTE — Telephone Encounter (Signed)
-----   Message from Arnoldo Morale, MD sent at 03/30/2016 11:53 AM EST ----- Potassium is severely low at 2.9, this could cause muscle weakness and cardiac irregularities; attempted calling the patient and was unable to reach her. I have sent a prescription for potassium replacement to her pharmacy and would like her to discontinue hydrochlorothiazide which could be the culprit (if she had diarrhea that could explain it as well) and I have replaced this with another antihypertensive-Coreg.Will repeat labs at her next visit.

## 2016-04-29 ENCOUNTER — Ambulatory Visit: Payer: 59 | Admitting: Family Medicine

## 2016-05-19 ENCOUNTER — Ambulatory Visit: Payer: 59 | Attending: Family Medicine | Admitting: Family Medicine

## 2016-05-19 ENCOUNTER — Encounter: Payer: Self-pay | Admitting: Family Medicine

## 2016-05-19 ENCOUNTER — Encounter (INDEPENDENT_AMBULATORY_CARE_PROVIDER_SITE_OTHER): Payer: Self-pay

## 2016-05-19 VITALS — BP 143/71 | HR 76 | Temp 98.0°F | Resp 18 | Ht 71.0 in | Wt 207.0 lb

## 2016-05-19 DIAGNOSIS — M79605 Pain in left leg: Secondary | ICD-10-CM | POA: Diagnosis not present

## 2016-05-19 DIAGNOSIS — I10 Essential (primary) hypertension: Secondary | ICD-10-CM

## 2016-05-19 DIAGNOSIS — Z9889 Other specified postprocedural states: Secondary | ICD-10-CM | POA: Insufficient documentation

## 2016-05-19 DIAGNOSIS — Z888 Allergy status to other drugs, medicaments and biological substances status: Secondary | ICD-10-CM | POA: Diagnosis not present

## 2016-05-19 DIAGNOSIS — H539 Unspecified visual disturbance: Secondary | ICD-10-CM | POA: Diagnosis not present

## 2016-05-19 DIAGNOSIS — E7800 Pure hypercholesterolemia, unspecified: Secondary | ICD-10-CM | POA: Diagnosis not present

## 2016-05-19 DIAGNOSIS — J329 Chronic sinusitis, unspecified: Secondary | ICD-10-CM | POA: Insufficient documentation

## 2016-05-19 DIAGNOSIS — J3089 Other allergic rhinitis: Secondary | ICD-10-CM | POA: Diagnosis not present

## 2016-05-19 DIAGNOSIS — Z79899 Other long term (current) drug therapy: Secondary | ICD-10-CM | POA: Insufficient documentation

## 2016-05-19 DIAGNOSIS — E876 Hypokalemia: Secondary | ICD-10-CM | POA: Diagnosis not present

## 2016-05-19 DIAGNOSIS — J302 Other seasonal allergic rhinitis: Secondary | ICD-10-CM | POA: Insufficient documentation

## 2016-05-19 DIAGNOSIS — R600 Localized edema: Secondary | ICD-10-CM | POA: Diagnosis not present

## 2016-05-19 HISTORY — DX: Other seasonal allergic rhinitis: J30.2

## 2016-05-19 MED ORDER — VALSARTAN 320 MG PO TABS: 320.0000 mg | ORAL_TABLET | Freq: Every day | ORAL | 3 refills | Status: DC

## 2016-05-19 MED ORDER — NAPROXEN 500 MG PO TABS: 500.0000 mg | ORAL_TABLET | Freq: Two times a day (BID) | ORAL | 1 refills | Status: DC

## 2016-05-19 MED ORDER — CARVEDILOL 6.25 MG PO TABS: 6.2500 mg | ORAL_TABLET | Freq: Two times a day (BID) | ORAL | 3 refills | Status: DC

## 2016-05-19 NOTE — Progress Notes (Signed)
Patient is here for FU

## 2016-05-19 NOTE — Progress Notes (Addendum)
Subjective:  Patient ID: Kaylee Nelson, female    DOB: April 15, 1955  Age: 61 y.o. MRN: [id number]  CC: Follow-up   HPI Kaylee Nelson is a 61 year old female with hypertension treated for chronic sinusitis and seasonal allergies at her last office visit. She endorses compliance with Patanol and Zyrtec but does have some residual bags under her eyes; denies cough or postnasal drip.  She complains of having to squint despite the fact that she uses glasses; has not had an eye exam recently.  Complains of pedal edema but denies shortness of breath or chest pain. She also has posterior left lower extremity pain which occurs when she fully extends her left lower extremity and spends a lot of her posterior thigh to her knee; this is described as shooting. Pain is absent when she walks and she has no knee pain and knee swelling.  She has been compliant with her antihypertensives; hydrochlorothiazide was discontinued due to hypokalemia. Her last potassium was 2.9  Past Medical History:  Diagnosis Date  . Hypertension     Past Surgical History:  Procedure Laterality Date  . CESAREAN SECTION      Allergies  Allergen Reactions  . Sulfa Antibiotics      Outpatient Medications Prior to Visit  Medication Sig Dispense Refill  . amoxicillin (AMOXIL) 500 MG capsule Take 1 capsule (500 mg total) by mouth 3 (three) times daily. 30 capsule 0  . atorvastatin (LIPITOR) 20 MG tablet Take 1 tablet (20 mg total) by mouth daily. (Patient not taking: Reported on 03/27/2016) 30 tablet 3  . cetirizine (ZYRTEC) 10 MG tablet Take 1 tablet (10 mg total) by mouth daily. 30 tablet 2  . fluticasone (FLONASE) 50 MCG/ACT nasal spray Place 2 sprays into both nostrils daily. (Patient not taking: Reported on 12/11/2015) 16 g 6  . olopatadine (PATANOL) 0.1 % ophthalmic solution Place 1 drop into both eyes 2 (two) times daily. 5 mL 1  . potassium chloride SA (K-DUR,KLOR-CON) 20 MEQ tablet Take 1 tablet (20 mEq total)  by mouth 2 (two) times daily. 10 tablet 0  . Vitamin D, Ergocalciferol, (DRISDOL) 50000 units CAPS capsule Take 1 capsule (50,000 Units total) by mouth every 7 (seven) days. 60 capsule 0  . amLODipine-valsartan (EXFORGE) 10-160 MG tablet Take 1 tablet by mouth daily. 30 tablet 5  . carvedilol (COREG) 3.125 MG tablet Take 1 tablet (3.125 mg total) by mouth 2 (two) times daily with a meal. 60 tablet 3   No facility-administered medications prior to visit.     ROS Review of Systems  Constitutional: Negative for activity change, appetite change and fatigue.  HENT: Negative for congestion, sinus pressure and sore throat.   Eyes: Positive for visual disturbance.  Respiratory: Negative for cough, chest tightness, shortness of breath and wheezing.   Cardiovascular: Positive for leg swelling. Negative for chest pain and palpitations.  Gastrointestinal: Negative for abdominal distention, abdominal pain and constipation.  Endocrine: Negative for polydipsia.  Genitourinary: Negative.  Negative for dysuria and frequency.  Musculoskeletal: Negative for arthralgias and back pain.       See hpi  Skin: Negative for rash.  Neurological: Negative for tremors, light-headedness and numbness.  Hematological: Does not bruise/bleed easily.  Psychiatric/Behavioral: Negative for agitation, behavioral problems and dysphoric mood.    Objective:  BP (!) 143/71 (BP Location: Right Arm, Patient Position: Sitting, Cuff Size: Large)   Pulse 76   Temp 98 F (36.7 C) (Oral)   Resp 18   Ht 5\' 11"  (1.803  m)   Wt 207 lb (93.9 kg)   SpO2 99%   BMI 28.87 kg/m   BP/Weight 05/19/2016 03/27/2016 37/02/624  Systolic BP 948 546 270  Diastolic BP 71 65 96  Wt. (Lbs) 207 204.2 210  BMI 28.87 29.3 29.29      Physical Exam  Constitutional: She is oriented to person, place, and time. She appears well-developed and well-nourished.  Eyes:  Bilateral infraorbital edema  Neck: No JVD present.  Cardiovascular: Normal  rate, normal heart sounds and intact distal pulses.   No murmur heard. Pulmonary/Chest: Effort normal and breath sounds normal. She has no wheezes. She has no rales. She exhibits no tenderness.  Abdominal: Soft. Bowel sounds are normal. She exhibits no distension and no mass. There is no tenderness.  Musculoskeletal: Normal range of motion. She exhibits no edema or tenderness.  Neurological: She is alert and oriented to person, place, and time.    CMP Latest Ref Rng & Units 03/27/2016 12/17/2015 10/09/2014  Glucose 65 - 99 mg/dL 89 91 108(H)  BUN 7 - 25 mg/dL 11 9 11   Creatinine 0.50 - 1.05 mg/dL 0.70 0.65 0.70  Sodium 135 - 146 mmol/L 142 146 143  Potassium 3.5 - 5.3 mmol/L 2.9(L) 3.3(L) 3.9  Chloride 98 - 110 mmol/L 103 105 103  CO2 20 - 31 mmol/L 32(H) 33(H) 33(H)  Calcium 8.6 - 10.4 mg/dL 9.1 9.6 9.6  Total Protein 6.1 - 8.1 g/dL - 6.9 -  Total Bilirubin 0.2 - 1.2 mg/dL - 0.7 -  Alkaline Phos 33 - 130 U/L - 78 -  AST 10 - 35 U/L - 19 -  ALT 6 - 29 U/L - 14 -    Lipid Panel     Component Value Date/Time   CHOL 200 (H) 12/17/2015 1035   TRIG 51 12/17/2015 1035   HDL 71 12/17/2015 1035   CHOLHDL 2.8 12/17/2015 1035   VLDL 10 12/17/2015 1035   LDLCALC 119 (H) 12/17/2015 1035     Assessment & Plan:   1. Essential hypertension Uncontrolled Switched from Exforge to Valsartan Increased dose of carvedilol  -Discontinue carvedilol (COREG) 6.25 MG tablet; Take 1 tablet (6.25 mg total) by mouth 2 (two) times daily with a meal.  Dispense: 60 tablet; Refill: 3 - valsartan (DIOVAN) 320 MG tablet; Take 1 tablet (320 mg total) by mouth daily.  Dispense: 30 tablet; Refill: 3  2. Pure hypercholesterolemia Continue Statin  3. Pedal edema Likely from Amlodipine which I have discontinued Elevate legs  4. Visual disturbance - Ambulatory referral to Ophthalmology  5. Hypokalemia - Basic Metabolic Panel  6. Leg pain, posterior, left Discussed stretching exercises - naproxen  (NAPROSYN) 500 MG tablet; Take 1 tablet (500 mg total) by mouth 2 (two) times daily with a meal.  Dispense: 30 tablet; Refill: 1  7. Seasonal allergic rhinitis due to other allergic trigger Continue Zyrtec, Patanol   Meds ordered this encounter  Medications  . carvedilol (COREG) 6.25 MG tablet    Sig: Take 1 tablet (6.25 mg total) by mouth 2 (two) times daily with a meal.    Dispense:  60 tablet    Refill:  3    Discontinue previous dose  . valsartan (DIOVAN) 320 MG tablet    Sig: Take 1 tablet (320 mg total) by mouth daily.    Dispense:  30 tablet    Refill:  3    Discontinue Exforge  . naproxen (NAPROSYN) 500 MG tablet    Sig: Take 1 tablet (  500 mg total) by mouth 2 (two) times daily with a meal.    Dispense:  30 tablet    Refill:  1    Follow-up: Return in about 2 months (around 07/19/2016) for Follow-up of hypertension and leg pain.   Arnoldo Morale MD

## 2016-05-20 ENCOUNTER — Encounter: Payer: Self-pay | Admitting: Family Medicine

## 2016-05-20 LAB — BASIC METABOLIC PANEL
BUN / CREAT RATIO: 9 — AB (ref 12–28)
BUN: 5 mg/dL — ABNORMAL LOW (ref 8–27)
CO2: 27 mmol/L (ref 18–29)
CREATININE: 0.55 mg/dL — AB (ref 0.57–1.00)
Calcium: 9.5 mg/dL (ref 8.7–10.3)
Chloride: 105 mmol/L (ref 96–106)
GFR, EST AFRICAN AMERICAN: 118 mL/min/{1.73_m2} (ref >59)
GFR, EST NON AFRICAN AMERICAN: 102 mL/min/{1.73_m2} (ref >59)
Glucose: 83 mg/dL (ref 65–99)
POTASSIUM: 3.3 mmol/L — AB (ref 3.5–5.2)
SODIUM: 146 mmol/L — AB (ref 134–144)

## 2016-05-21 ENCOUNTER — Telehealth: Payer: Self-pay

## 2016-05-21 NOTE — Telephone Encounter (Signed)
Writer LVM informing patient of her lab results per Dr. Jarold Song and encouraged her to call with questions.

## 2016-05-21 NOTE — Telephone Encounter (Signed)
-----   Message from Arnoldo Morale, MD sent at 05/20/2016  4:46 PM EDT ----- Potassium is slightly low but has improved compared to previous labs. Hopefully the increase in dose of her valsartan should correct this. Increase intake of potassium rich foods.

## 2016-06-30 ENCOUNTER — Ambulatory Visit: Payer: 59 | Admitting: Family Medicine

## 2016-07-20 ENCOUNTER — Ambulatory Visit: Payer: 59 | Admitting: Family Medicine

## 2016-07-28 ENCOUNTER — Other Ambulatory Visit: Payer: Self-pay | Admitting: Family Medicine

## 2016-07-28 DIAGNOSIS — M79605 Pain in left leg: Secondary | ICD-10-CM

## 2016-09-01 ENCOUNTER — Telehealth: Payer: Self-pay | Admitting: *Deleted

## 2016-09-01 ENCOUNTER — Other Ambulatory Visit: Payer: Self-pay | Admitting: Pharmacist

## 2016-09-01 DIAGNOSIS — I10 Essential (primary) hypertension: Secondary | ICD-10-CM

## 2016-09-01 MED ORDER — LOSARTAN POTASSIUM 100 MG PO TABS: 100.0000 mg | ORAL_TABLET | Freq: Every day | ORAL | 0 refills | Status: DC

## 2016-09-01 NOTE — Telephone Encounter (Addendum)
Pt informed by her pharmacist about a recall on BP medication: Losartan/ Cozaar.   Pt out of medication for 3 days.  Please address concern.  Informed she may need OV

## 2016-09-02 ENCOUNTER — Other Ambulatory Visit: Payer: Self-pay | Admitting: Family Medicine

## 2016-09-02 MED ORDER — LISINOPRIL 20 MG PO TABS: 20.0000 mg | ORAL_TABLET | Freq: Every day | ORAL | 3 refills | Status: DC

## 2016-09-02 MED ORDER — CARVEDILOL 6.25 MG PO TABS: 6.2500 mg | ORAL_TABLET | Freq: Two times a day (BID) | ORAL | 3 refills | Status: DC

## 2016-09-02 MED ORDER — LOSARTAN POTASSIUM 100 MG PO TABS
100.0000 mg | ORAL_TABLET | Freq: Every day | ORAL | 0 refills | Status: DC
Start: 2016-09-02 — End: 2016-09-02

## 2016-09-02 NOTE — Telephone Encounter (Signed)
Refilled

## 2016-09-02 NOTE — Telephone Encounter (Signed)
Medication changed to lisinopril. Left message on voicemail in reference to medication and also to return call for questions or concerns.

## 2016-09-03 NOTE — Telephone Encounter (Signed)
Left message on voicemail.

## 2016-09-23 ENCOUNTER — Ambulatory Visit: Payer: 59 | Attending: Family Medicine | Admitting: Physician Assistant

## 2016-09-23 ENCOUNTER — Other Ambulatory Visit: Payer: Self-pay

## 2016-09-23 VITALS — BP 144/82 | HR 80 | Temp 98.7°F | Resp 16 | Wt 206.2 lb

## 2016-09-23 DIAGNOSIS — R079 Chest pain, unspecified: Secondary | ICD-10-CM | POA: Insufficient documentation

## 2016-09-23 DIAGNOSIS — I10 Essential (primary) hypertension: Secondary | ICD-10-CM | POA: Diagnosis not present

## 2016-09-23 DIAGNOSIS — Z79899 Other long term (current) drug therapy: Secondary | ICD-10-CM | POA: Insufficient documentation

## 2016-09-23 DIAGNOSIS — Z7982 Long term (current) use of aspirin: Secondary | ICD-10-CM | POA: Insufficient documentation

## 2016-09-23 DIAGNOSIS — M7989 Other specified soft tissue disorders: Secondary | ICD-10-CM | POA: Insufficient documentation

## 2016-09-23 DIAGNOSIS — Z882 Allergy status to sulfonamides status: Secondary | ICD-10-CM | POA: Insufficient documentation

## 2016-09-23 DIAGNOSIS — R0609 Other forms of dyspnea: Secondary | ICD-10-CM | POA: Insufficient documentation

## 2016-09-23 MED ORDER — ASPIRIN EC 81 MG PO TBEC: 81.0000 mg | DELAYED_RELEASE_TABLET | Freq: Every day | ORAL | 1 refills | Status: DC

## 2016-09-23 MED ORDER — LOSARTAN POTASSIUM 100 MG PO TABS: 100.0000 mg | ORAL_TABLET | Freq: Every day | ORAL | 3 refills | Status: DC

## 2016-09-23 NOTE — Patient Instructions (Signed)
Check BP 3-4 times weekly and record and bring to next office visit

## 2016-09-23 NOTE — Progress Notes (Signed)
Kaylee Nelson, is a 61 y.o. female  MOQ:[id number]  PTW:[id number]  DOB - 1955-11-19  Subjective:  Chief Complaint and HPI: Kaylee Nelson is a 61 y.o. female here today for BP check when she told the nurse she had been experiencing some CP and was put on my schedule.  Patient tells me she has been havng CP each morning for about 20 mins daily just after taking her lisinopril.  Pain is dull and fleeting.  She has also had DOE for months.  C/o L leg swelling X 1 week with leg "tightness."  Denies calf pain.  Multiple BP medication changes over the last few months.  She doesn't want to take Lisinopril any more.  No N/V.  No diaphoresis.  No jaw/arm pain.  No pain currently.  Pain only occurs in the morning when she is on her way to work after taking Lisinopril.  ROS:   Constitutional:  No f/c, No night sweats, No unexplained weight loss. EENT:  No vision changes, No blurry vision, No hearing changes. No mouth, throat, or ear problems.  Respiratory: No cough, + SOB on exertion Cardiac: + CP, no palpitations GI:  No abd pain, No N/V/D. GU: No Urinary s/sx Musculoskeletal: No joint pain Neuro: No headache, no dizziness, no motor weakness.  Skin: No rash Endocrine:  No polydipsia. No polyuria.  Psych: Denies SI/HI  No problems updated.  ALLERGIES: Allergies  Allergen Reactions  . Sulfa Antibiotics     PAST MEDICAL HISTORY: Past Medical History:  Diagnosis Date  . Hypertension     MEDICATIONS AT HOME: Prior to Admission medications   Medication Sig Start Date End Date Taking? Authorizing Provider  cetirizine (ZYRTEC) 10 MG tablet Take 1 tablet (10 mg total) by mouth daily. 03/27/16  Yes Amao, Charlane Ferretti, MD  naproxen (NAPROSYN) 500 MG tablet TAKE 1 TABLET BY MOUTH TWICE DAILY WITH MEALS 07/30/16  Yes Amao, Enobong, MD  olopatadine (PATANOL) 0.1 % ophthalmic solution Place 1 drop into both eyes 2 (two) times daily. 03/27/16  Yes Arnoldo Morale, MD  Vitamin D, Ergocalciferol,  (DRISDOL) 50000 units CAPS capsule Take 1 capsule (50,000 Units total) by mouth every 7 (seven) days. 12/12/15  Yes Arnoldo Morale, MD  aspirin EC 81 MG tablet Take 1 tablet (81 mg total) by mouth daily. 09/23/16   Argentina Donovan, PA-C  carvedilol (COREG) 6.25 MG tablet Take 1 tablet (6.25 mg total) by mouth 2 (two) times daily with a meal. 09/02/16   Arnoldo Morale, MD  losartan (COZAAR) 100 MG tablet Take 1 tablet (100 mg total) by mouth daily. 09/23/16   Argentina Donovan, PA-C     Objective:  EXAM:   Vitals:   09/23/16 1442 09/23/16 1444  BP: 140/80 (!) 144/82  Pulse:  80  Resp:  16  Temp:  98.7 F (37.1 C)  TempSrc:  Oral  SpO2:  97%  Weight:  206 lb 3.2 oz (93.5 kg)    General appearance : A&OX3. NAD. Non-toxic-appearing HEENT: Atraumatic and Normocephalic.  PERRLA. EOM intact.  Neck: supple, no JVD. No cervical lymphadenopathy. No thyromegaly Chest/Lungs:  Breathing-non-labored, Good air entry bilaterally, breath sounds normal without rales, rhonchi, or wheezing  CVS: S1 S2 regular, no murmurs, gallops, rubs  Extremities: Bilateral Lower Ext examined.  L with 1+edema and visibly larger than R, neg Homan's, no erythema of calf. both legs are warm to touch with = pulse throughout Neurology:  CN II-XII grossly intact, Non focal.   Psych:  TP linear.  J/I WNL. Normal speech. Appropriate eye contact and affect.  Skin:  No Rash  Data Review No results found for: HGBA1C   Assessment & Plan   1. Left leg swelling - VAS [place] LOWER EXTREMITY VENOUS (DVT); Future(09/30/2016-explained to call 911 if worsening CP/SOB, etc-patient expresses agreement and understanding - Comprehensive metabolic panel  2. Chest pain, unspecified type EKG w/o ischemia and she is currently CP free. - aspirin EC 81 MG tablet; Take 1 tablet (81 mg total) by mouth daily.  Dispense: 100 tablet; Refill: 1  3. DOE (dyspnea on exertion) This has been going on for several months-not new-to ED/call 911 if worsens  acutely.  4. Hypertension, unspecified type - losartan (COZAAR) 100 MG tablet; Take 1 tablet (100 mg total) by mouth daily.  Dispense: 90 tablet; Refill: 3 - aspirin EC 81 MG tablet; Take 1 tablet (81 mg total) by mouth daily.  Dispense: 100 tablet; Refill: 1 - Comprehensive metabolic panel   Patient have been counseled extensively about nutrition and exercise  Return in about 1 month (around 10/24/2016) for Dr Jarold Song; htn f/up.  The patient was given clear instructions to go to ER or return to medical center if symptoms don't improve, worsen or new problems develop. The patient verbalized understanding. The patient was told to call to get lab results if they haven't heard anything in the next week.     Freeman Caldron, PA-C [facility] [place], [place]   09/23/2016, 3:44 PMPatient ID: Kaylee Nelson, female   DOB: [birth year], 61 y.o.   MRN: [id number]

## 2016-09-23 NOTE — Progress Notes (Signed)
Pt arrived to [facility], alert and oriented. Pt called office and requested to have BP checked. She was started on new hypertension Lisinopril 20 mg 09/02/2016. Since she states she has noticed swelling in BLE,  chest pain, SOB, dizziness,  in mid morning when preparing self for work after taking medication. Symptoms will last 15-20 minutes after taking medication.   Clarified with patient that blurred vision was present prior to taking medication. She states she has to follow up with eye doctor.   Pt has +1 pitting edema in BLE. Verified medication and states medication was taken this morning.  Manual blood pressure reading: 144/82 and 140/80  Pt was placed on walk-in schedule for evaluation.

## 2016-09-24 ENCOUNTER — Telehealth: Payer: Self-pay

## 2016-09-24 LAB — COMPREHENSIVE METABOLIC PANEL
A/G RATIO: 1.8 (ref 1.2–2.2)
ALBUMIN: 4.1 g/dL (ref 3.6–4.8)
ALT: 10 IU/L (ref 0–32)
AST: 15 IU/L (ref 0–40)
Alkaline Phosphatase: 90 IU/L (ref 39–117)
BILIRUBIN TOTAL: 0.4 mg/dL (ref 0.0–1.2)
BUN / CREAT RATIO: 17 (ref 12–28)
BUN: 10 mg/dL (ref 8–27)
CHLORIDE: 108 mmol/L — AB (ref 96–106)
CO2: 26 mmol/L (ref 20–29)
Calcium: 9.2 mg/dL (ref 8.7–10.3)
Creatinine, Ser: 0.6 mg/dL (ref 0.57–1.00)
GFR calc non Af Amer: 99 mL/min/{1.73_m2} (ref >59)
GFR, EST AFRICAN AMERICAN: 115 mL/min/{1.73_m2} (ref >59)
GLOBULIN, TOTAL: 2.3 g/dL (ref 1.5–4.5)
GLUCOSE: 79 mg/dL (ref 65–99)
POTASSIUM: 3.6 mmol/L (ref 3.5–5.2)
SODIUM: 147 mmol/L — AB (ref 134–144)
TOTAL PROTEIN: 6.4 g/dL (ref 6.0–8.5)

## 2016-09-24 NOTE — Telephone Encounter (Signed)
Pt was called and a VM was left informing pt to return phone call for lab results. If pt call back please inform pt:  Please call patient. Her sodium is a little high which may be contributing to her legs swelling. Increase water intake. Kidney function, blood sugar, and liver function are normal.  Thanks,  Freeman Caldron, PA-C

## 2016-09-30 ENCOUNTER — Ambulatory Visit (HOSPITAL_COMMUNITY): Admission: RE | Admit: 2016-09-30 | Payer: 59 | Source: Ambulatory Visit

## 2016-10-01 ENCOUNTER — Other Ambulatory Visit: Payer: Self-pay | Admitting: Family Medicine

## 2016-10-01 DIAGNOSIS — M79605 Pain in left leg: Secondary | ICD-10-CM

## 2016-10-12 ENCOUNTER — Ambulatory Visit: Payer: 59 | Attending: Family Medicine | Admitting: Family Medicine

## 2016-10-12 ENCOUNTER — Encounter: Payer: Self-pay | Admitting: Family Medicine

## 2016-10-12 VITALS — BP 160/100 | HR 70 | Temp 98.1°F | Ht 71.0 in | Wt 202.8 lb

## 2016-10-12 DIAGNOSIS — I10 Essential (primary) hypertension: Secondary | ICD-10-CM | POA: Diagnosis not present

## 2016-10-12 DIAGNOSIS — J3089 Other allergic rhinitis: Secondary | ICD-10-CM | POA: Diagnosis not present

## 2016-10-12 DIAGNOSIS — T781XXA Other adverse food reactions, not elsewhere classified, initial encounter: Secondary | ICD-10-CM | POA: Diagnosis not present

## 2016-10-12 DIAGNOSIS — Z79899 Other long term (current) drug therapy: Secondary | ICD-10-CM | POA: Diagnosis not present

## 2016-10-12 DIAGNOSIS — R600 Localized edema: Secondary | ICD-10-CM

## 2016-10-12 DIAGNOSIS — T7840XA Allergy, unspecified, initial encounter: Secondary | ICD-10-CM | POA: Diagnosis not present

## 2016-10-12 DIAGNOSIS — Z91013 Allergy to seafood: Secondary | ICD-10-CM | POA: Diagnosis not present

## 2016-10-12 DIAGNOSIS — J302 Other seasonal allergic rhinitis: Secondary | ICD-10-CM | POA: Insufficient documentation

## 2016-10-12 DIAGNOSIS — X58XXXA Exposure to other specified factors, initial encounter: Secondary | ICD-10-CM | POA: Diagnosis not present

## 2016-10-12 DIAGNOSIS — Z7982 Long term (current) use of aspirin: Secondary | ICD-10-CM | POA: Insufficient documentation

## 2016-10-12 HISTORY — DX: Localized edema: R60.0

## 2016-10-12 NOTE — Progress Notes (Signed)
Subjective:  Patient ID: Kaylee Nelson, female    DOB: 06-23-1955  Age: 61 y.o. MRN: [id number]  CC: Allergic Reaction (shrimp)   HPI Kaylee Nelson is a 61 year old female with a history of hypertension, seasonal allergies who presents today for follow-up visit.  She complains of left facial edema and numbness, associated lipedema and development of a rash which was pruritic after she ingested some shrimp 2 days ago. Symptoms improved with Benadryl however she does have some residual left-sided infraorbital swelling, erythematous rash on her leg. Pruritus has subsided. She denies dyspnea at the time and did not seek medical attention.  Her blood pressure was significantly elevated at 185/73 on the DynaMap but repeat performed by myself is 160/100 and she endorses compliance with her medications. She attributes this to being laid of this morning after she had informed them on short notice she would be coming for her doctor's appointment today.  She also endorses pedal edema in her left leg and had been scheduled for lower extremity Doppler to visit with the PA; appointment comes up later this month.  Edema is not limited to certain times of the day but persistent all day long. She denies sodium intake, pain in her calves.  Past Medical History:  Diagnosis Date  . Hypertension     Past Surgical History:  Procedure Laterality Date  . CESAREAN SECTION      Allergies  Allergen Reactions  . Shrimp [Shellfish Allergy]   . Sulfa Antibiotics      Outpatient Medications Prior to Visit  Medication Sig Dispense Refill  . aspirin EC 81 MG tablet Take 1 tablet (81 mg total) by mouth daily. 100 tablet 1  . carvedilol (COREG) 6.25 MG tablet Take 1 tablet (6.25 mg total) by mouth 2 (two) times daily with a meal. 60 tablet 3  . cetirizine (ZYRTEC) 10 MG tablet Take 1 tablet (10 mg total) by mouth daily. 30 tablet 2  . losartan (COZAAR) 100 MG tablet Take 1 tablet (100 mg total) by mouth  daily. 90 tablet 3  . naproxen (NAPROSYN) 500 MG tablet TAKE 1 TABLET BY MOUTH TWICE DAILY WITH MEALS 30 tablet 1  . Vitamin D, Ergocalciferol, (DRISDOL) 50000 units CAPS capsule Take 1 capsule (50,000 Units total) by mouth every 7 (seven) days. 60 capsule 0  . olopatadine (PATANOL) 0.1 % ophthalmic solution Place 1 drop into both eyes 2 (two) times daily. (Patient not taking: Reported on 10/12/2016) 5 mL 1   No facility-administered medications prior to visit.     ROS Review of Systems  Constitutional: Negative for activity change, appetite change and fatigue.  HENT: Positive for facial swelling. Negative for congestion, sinus pressure and sore throat.   Eyes: Negative for visual disturbance.       See hpi  Respiratory: Negative for cough, chest tightness, shortness of breath and wheezing.   Cardiovascular: Positive for leg swelling. Negative for chest pain and palpitations.  Gastrointestinal: Negative for abdominal distention, abdominal pain and constipation.  Endocrine: Negative for polydipsia.  Genitourinary: Negative for dysuria and frequency.  Musculoskeletal: Negative for arthralgias and back pain.  Skin: Positive for rash.  Allergic/Immunologic: Positive for food allergies.  Neurological: Negative for tremors, light-headedness and numbness.  Hematological: Does not bruise/bleed easily.  Psychiatric/Behavioral: Negative for agitation and behavioral problems.    Objective:  BP (!) 160/100   Pulse 70   Temp 98.1 F (36.7 C) (Oral)   Ht 5\' 11"  (1.803 m)   Wt 202 lb 12.8  oz (92 kg)   SpO2 100%   BMI 28.28 kg/m   BP/Weight 10/12/2016 09/23/2016 7/65/4650  Systolic BP 354 656 812  Diastolic BP 751 82 71  Wt. (Lbs) 202.8 206.2 207  BMI 28.28 28.76 28.87      Physical Exam  Constitutional: She is oriented to person, place, and time. She appears well-developed and well-nourished.  Eyes:  Bilateral infraorbital edema, left greater than right  Cardiovascular: Normal rate,  normal heart sounds and intact distal pulses.   No murmur heard. Pulmonary/Chest: Effort normal and breath sounds normal. She has no wheezes. She has no rales. She exhibits no tenderness.  Abdominal: Soft. Bowel sounds are normal. She exhibits no distension and no mass. There is no tenderness.  Musculoskeletal: Normal range of motion. She exhibits edema (1+ bilateral pitting ankle edema; left greater than right).  Neurological: She is alert and oriented to person, place, and time.  Skin: Rash (erythematous patches on eft leg) noted.     Assessment & Plan:   1. Essential hypertension Uncontrolled Despite compliance with antihypertensive Could also be secondary to job loss which occurred today We will reassess at next visit and consider regimen changes to elevated Low-sodium, DASH diet  2. Allergic reaction, initial encounter  Allergy to shrimp Symptoms are improving Discussed risks and benefits of prednisone-we'll hold off on prednisone Placed on Benadryl - discussed sedating effect Notify the clinic if symptoms worsen  3. Pedal edema More pronounced in the left lower extremity Suspicious for venous insufficiency She is scheduled for lower extremity Doppler Low-sodium, elevate legs, compression stockings will consider diuretic next visit  4. Seasonal allergies Stable Continue Zyrtec  No orders of the defined types were placed in this encounter.   Follow-up: Return in about 1 week (around 10/19/2016) for Complete physical exam.   Arnoldo Morale MD

## 2016-10-13 ENCOUNTER — Telehealth: Payer: Self-pay | Admitting: Family Medicine

## 2016-10-13 DIAGNOSIS — I10 Essential (primary) hypertension: Secondary | ICD-10-CM

## 2016-10-13 NOTE — Telephone Encounter (Signed)
Pt was called and a VM was left informing pt to return phone call. 

## 2016-10-13 NOTE — Telephone Encounter (Signed)
Pt called since she was here yesterday and her BP still elevate, she is very concern, she would like to speak with the nurse or the pcp, please follow up

## 2016-10-15 ENCOUNTER — Ambulatory Visit (HOSPITAL_COMMUNITY): Admission: RE | Admit: 2016-10-15 | Payer: 59 | Source: Ambulatory Visit

## 2016-10-15 MED ORDER — CARVEDILOL 12.5 MG PO TABS: 12.5000 mg | ORAL_TABLET | Freq: Two times a day (BID) | ORAL | 2 refills | Status: DC

## 2016-10-15 MED FILL — CARVEDILOL 12.5 MG TABLET: 12.5 | 30 days supply | Qty: 60 | Fill #0

## 2016-10-15 NOTE — Telephone Encounter (Signed)
Pt was called and pt states that her BP is still elevated. Pt at home reading are 138/82,153/91,147/88,177/101. Pt states that these BP are taken in the AM. Please follow up.

## 2016-10-15 NOTE — Telephone Encounter (Signed)
I have increased her carvedilol to 12.5 mg twice daily and she can take 2 of her current 6.25 mg pills twice daily until the new dose is ready at the pharmacy.

## 2016-10-15 NOTE — Telephone Encounter (Signed)
Pt was called and informed of Dr. Kayleen Memos.

## 2016-10-19 NOTE — Telephone Encounter (Signed)
Pt called and said all weekend her bp is high and the medication change is not helping. Pt states she is having headaches please fu

## 2016-10-20 ENCOUNTER — Telehealth: Payer: Self-pay

## 2016-10-20 ENCOUNTER — Ambulatory Visit (HOSPITAL_COMMUNITY): Admission: RE | Admit: 2016-10-20 | Payer: 59 | Source: Ambulatory Visit

## 2016-10-20 NOTE — Telephone Encounter (Signed)
Needs OV in the setting of BP concerns. NSAIDS do cause elevation in BP. Will reassess BP at OV.

## 2016-10-20 NOTE — Telephone Encounter (Signed)
Pt states blood pressure are still running high. Readings are 158/92, 163/93 on yesterday Reading while on the phone: 153/93 and 159/95. Took medication at 0730 - 0800 Denies pain in chest. C/o headache when she wakes up, that she has to take an Aleve to get relief.  Headache this week.  She is taken Carvedilol and losartan. She does not feel that the losartan is working.   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 Can she drop forms off or does she need an OV?   Please advise

## 2016-10-20 NOTE — Telephone Encounter (Signed)
Pt states she called because while she was went out and decided to call for she may have received a call from her PCP. Informed patient that the provider has not responded to her call since last speaking to her this morning. Pt sounded upset the physician has not responded. She states she has since felt numbness on her tongue. Advised patient if she feels she needs to proceed to ED that I could not advise her not to do so. Pt verbalized understanding.

## 2016-10-20 NOTE — Telephone Encounter (Signed)
Pt contacted the office and stated she spoke with a nurse this morning about her bp. Pt states now her tongue is numb and she is requesting to speak with a nurse. I spoke with Carilyn Goodpasture and per Carilyn Goodpasture to forward pt call to her so she can speak with her

## 2016-10-20 NOTE — Telephone Encounter (Signed)
Appointment scheduled: 10/22/2016 at 1015. Pt aware.

## 2016-10-20 NOTE — Telephone Encounter (Signed)
Pt aware of message for BP. Scheduled appointment for f/u.

## 2016-10-20 NOTE — Telephone Encounter (Signed)
Unable to reach patient. Left message on voicemail on both phone numbers listed for patient to return call.

## 2016-10-21 ENCOUNTER — Ambulatory Visit (HOSPITAL_COMMUNITY)
Admission: RE | Admit: 2016-10-21 | Discharge: 2016-10-21 | Disposition: A | Discharge status: Home / Self Care | Payer: 59 | Source: Ambulatory Visit | Attending: Physician Assistant | Admitting: Physician Assistant

## 2016-10-21 DIAGNOSIS — M7989 Other specified soft tissue disorders: Secondary | ICD-10-CM | POA: Diagnosis not present

## 2016-10-21 NOTE — Progress Notes (Signed)
*  PRELIMINARY RESULTS* Vascular Ultrasound Left lower extremity venous duplex has been completed.  Preliminary findings: No evidence of deep vein thrombosis or baker's cyst in the left lower extremity.  Attempted to call preliminary results called to doctors office@ 10:30, no answer. Patient has scheduled appointment tomorrow 10/22/16 so she will follow up then.   Everrett Coombe 10/21/2016, 10:29 AM

## 2016-10-22 ENCOUNTER — Ambulatory Visit: Payer: 59 | Attending: Family Medicine | Admitting: Family Medicine

## 2016-10-22 ENCOUNTER — Encounter: Payer: Self-pay | Admitting: Family Medicine

## 2016-10-22 VITALS — BP 152/75 | HR 80 | Temp 98.1°F | Ht 71.0 in | Wt 205.0 lb

## 2016-10-22 DIAGNOSIS — I10 Essential (primary) hypertension: Secondary | ICD-10-CM | POA: Diagnosis present

## 2016-10-22 DIAGNOSIS — Z79899 Other long term (current) drug therapy: Secondary | ICD-10-CM | POA: Insufficient documentation

## 2016-10-22 DIAGNOSIS — R600 Localized edema: Secondary | ICD-10-CM | POA: Insufficient documentation

## 2016-10-22 DIAGNOSIS — Z7982 Long term (current) use of aspirin: Secondary | ICD-10-CM | POA: Insufficient documentation

## 2016-10-22 MED ORDER — CARVEDILOL 25 MG PO TABS: 25.0000 mg | ORAL_TABLET | Freq: Two times a day (BID) | ORAL | 3 refills | Status: DC

## 2016-10-22 MED FILL — CARVEDILOL 25 MG TABLET: 25 | 30 days supply | Qty: 60 | Fill #0

## 2016-10-22 NOTE — Telephone Encounter (Signed)
-----   Message from Argentina Donovan, [place] sent at 10/22/2016  8:52 AM EDT ----- Please call patient and let her know that her studies do not show a blood clot.  Follow-up as planned. Thanks, Freeman Caldron, PA-C

## 2016-10-22 NOTE — Progress Notes (Signed)
Subjective:  Patient ID: Kaylee Nelson, female    DOB: 1956/01/02  Age: 61 y.o. MRN: [id number]  CC: Hypertension   HPI Kaylee Nelson is a 61 year old female with a history of hypertension, seasonal allergies who Presents today with elevated blood pressures despite increasing dose of her carvedilol. She currently takes 12.5 mg twice daily and her blood pressure is still elevated. Denies sodium intake. 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  Past Medical History:  Diagnosis Date  . Hypertension     Past Surgical History:  Procedure Laterality Date  . CESAREAN SECTION       Outpatient Medications Prior to Visit  Medication Sig Dispense Refill  . aspirin EC 81 MG tablet Take 1 tablet (81 mg total) by mouth daily. 100 tablet 1  . losartan (COZAAR) 100 MG tablet Take 1 tablet (100 mg total) by mouth daily. 90 tablet 3  . naproxen (NAPROSYN) 500 MG tablet TAKE 1 TABLET BY MOUTH TWICE DAILY WITH MEALS 30 tablet 1  . carvedilol (COREG) 12.5 MG tablet Take 1 tablet (12.5 mg total) by mouth 2 (two) times daily with a meal. 60 tablet 2  . cetirizine (ZYRTEC) 10 MG tablet Take 1 tablet (10 mg total) by mouth daily. (Patient not taking: Reported on 10/22/2016) 30 tablet 2  . olopatadine (PATANOL) 0.1 % ophthalmic solution Place 1 drop into both eyes 2 (two) times daily. (Patient not taking: Reported on 10/12/2016) 5 mL 1   No facility-administered medications prior to visit.     ROS Review of Systems  Constitutional: Negative for activity change, appetite change and fatigue.  HENT: Negative for congestion, sinus pressure and sore throat.   Eyes: Negative for visual disturbance.  Respiratory: Negative for cough, chest tightness, shortness of breath and wheezing.     Cardiovascular: Positive for leg swelling. Negative for chest pain and palpitations.  Gastrointestinal: Negative for abdominal distention, abdominal pain and constipation.  Endocrine: Negative for polydipsia.  Genitourinary: Negative for dysuria and frequency.  Musculoskeletal: Negative for arthralgias and back pain.  Skin: Negative for rash.  Neurological: Negative for tremors, light-headedness and numbness.  Hematological: Does not bruise/bleed easily.  Psychiatric/Behavioral: Negative for agitation and behavioral problems.    Objective:  BP (!) 152/75   Pulse 80   Temp 98.1 F (36.7 C) (Oral)   Ht 5\' 11"  (1.803 m)   Wt 205 lb (93 kg)   SpO2 100%   BMI 28.59 kg/m   BP/Weight 10/22/2016 10/12/2016 0/93/2355  Systolic BP 732 202 542  Diastolic BP 75 706 82  Wt. (Lbs) 205 202.8 206.2  BMI 28.59 28.28 28.76      Physical Exam  Constitutional: She is oriented to person, place, and time. She appears well-developed and well-nourished.  Cardiovascular: Normal rate, normal heart sounds and intact distal pulses.   No murmur heard. Pulmonary/Chest: Effort normal and breath sounds normal. She has no wheezes. She has no rales. She exhibits no tenderness.  Abdominal: Soft. Bowel sounds are normal. She exhibits no distension and no mass. There is no tenderness.  Musculoskeletal: Normal range of motion. She exhibits edema (1+ left ankle edema).  Neurological: She is alert and oriented to person, place, and time.     Assessment & Plan:   1. Essential  hypertension Uncontrolled Increase carvedilol dose Low-sodium, DASH diet, lifestyle modifications - carvedilol (COREG) 25 MG tablet; Take 1 tablet (25 mg total) by mouth 2 (two) times daily with a meal.  Dispense: 60 tablet; Refill: 3  2. Pedal edema Prescription for compression stocking has been written   Meds ordered this encounter  Medications  . carvedilol (COREG) 25 MG tablet    Sig: Take 1 tablet (25 mg total) by mouth 2  (two) times daily with a meal.    Dispense:  60 tablet    Refill:  3    Discontinue previous dose    Follow-up: Return in about 1 week (around 10/29/2016) for Follow-up of hypertension.   Arnoldo Morale MD

## 2016-10-22 NOTE — Patient Instructions (Signed)
Increase carvedilol to 25 mg orally twice daily.

## 2016-10-22 NOTE — Telephone Encounter (Signed)
Patient verified DOB Patient is aware of no blood clots being noted. No further questions at this time.

## 2016-10-26 ENCOUNTER — Ambulatory Visit: Payer: 59 | Attending: Family Medicine | Admitting: *Deleted

## 2016-10-26 DIAGNOSIS — Z111 Encounter for screening for respiratory tuberculosis: Secondary | ICD-10-CM | POA: Insufficient documentation

## 2016-10-26 NOTE — Progress Notes (Signed)
PPD Placement note  Kaylee Nelson, 61 y.o. female is here today for placement of PPD test Reason for PPD test: Employeement Pt taken PPD test before: yes Verified in allergy area and with patient that they are not allergic to the products PPD is made of (Phenol or Tween). Yes Is patient taking any oral or IV steroid medication now or have they taken it in the last month? no Has the patient ever received the BCG vaccine?: no Has the patient been in recent contact with anyone known or suspected of having active TB disease?: no      PPD placed on 10/26/2016.  Patient advised to return for reading within 48-72 hours.

## 2016-10-29 ENCOUNTER — Encounter: Payer: Self-pay | Admitting: Family Medicine

## 2016-10-29 ENCOUNTER — Ambulatory Visit: Payer: 59 | Attending: Family Medicine | Admitting: Family Medicine

## 2016-10-29 VITALS — BP 140/80 | HR 71 | Temp 98.0°F | Ht 71.0 in | Wt 206.6 lb

## 2016-10-29 DIAGNOSIS — I10 Essential (primary) hypertension: Secondary | ICD-10-CM

## 2016-10-29 DIAGNOSIS — Z7982 Long term (current) use of aspirin: Secondary | ICD-10-CM | POA: Diagnosis not present

## 2016-10-29 DIAGNOSIS — E876 Hypokalemia: Secondary | ICD-10-CM | POA: Diagnosis not present

## 2016-10-29 DIAGNOSIS — Z79899 Other long term (current) drug therapy: Secondary | ICD-10-CM | POA: Diagnosis not present

## 2016-10-29 LAB — TB SKIN TEST
INDURATION: 0 mm
TB SKIN TEST: NEGATIVE

## 2016-10-29 MED ORDER — LOSARTAN POTASSIUM-HCTZ 100-25 MG PO TABS: 1.0000 | ORAL_TABLET | Freq: Every day | ORAL | 5 refills | Status: DC

## 2016-10-29 MED ORDER — POTASSIUM CHLORIDE ER 10 MEQ PO TBCR: 10.0000 meq | EXTENDED_RELEASE_TABLET | Freq: Every day | ORAL | 3 refills | Status: DC

## 2016-10-29 NOTE — Progress Notes (Signed)
Subjective:  Patient ID: Kaylee Nelson, female    DOB: 29-Aug-1955  Age: 61 y.o. MRN: [id number]  CC: Hypertension   HPI Kaylee Nelson  is a 61 year old female with a history of hypertension, seasonal allergies who Presents today for follow-up of hypertension. Carvedilol dose was increased at her last office visit and she reports that her systolic blood pressures have been in the 140-150 range at home. Her systolic blood pressure in the clinic was in the 160s initially but repeat performed manually by me was down to 140/80. Denies ingestion of foods high in sodium.  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 She has no other concerns today.  Past Medical History:  Diagnosis Date  . Hypertension     Past Surgical History:  Procedure Laterality Date  . CESAREAN SECTION      Allergies  Allergen Reactions  . Shrimp [Shellfish Allergy]   . Sulfa Antibiotics      Outpatient Medications Prior to Visit  Medication Sig Dispense Refill  . aspirin EC 81 MG tablet Take 1 tablet (81 mg total) by mouth daily. 100 tablet 1  . carvedilol (COREG) 25 MG tablet Take 1 tablet (25 mg total) by mouth 2 (two) times daily with a meal. 60 tablet 3  . cetirizine (ZYRTEC) 10 MG tablet Take 1 tablet (10 mg total) by mouth daily. 30 tablet 2  . naproxen (NAPROSYN) 500 MG tablet TAKE 1 TABLET BY MOUTH TWICE DAILY WITH MEALS 30 tablet 1  . losartan (COZAAR) 100 MG tablet Take 1 tablet (100 mg total) by mouth daily. 90 tablet 3  . olopatadine (PATANOL) 0.1 % ophthalmic solution Place 1 drop into both eyes 2 (two) times daily. (Patient not taking: Reported on 10/29/2016) 5 mL 1   No facility-administered medications prior to visit.     ROS Review of Systems  Constitutional: Negative for activity change, appetite change and fatigue.  HENT: Negative for congestion, sinus pressure and sore throat.   Eyes: Negative for visual disturbance.  Respiratory:  Negative for cough, chest tightness, shortness of breath and wheezing.   Cardiovascular: Negative for chest pain and palpitations.  Gastrointestinal: Negative for abdominal distention, abdominal pain and constipation.  Endocrine: Negative for polydipsia.  Genitourinary: Negative for dysuria and frequency.  Musculoskeletal: Negative for arthralgias and back pain.  Skin: Negative for rash.  Neurological: Negative for tremors, light-headedness and numbness.  Hematological: Does not bruise/bleed easily.  Psychiatric/Behavioral: Negative for agitation and behavioral problems.    Objective:  BP 140/80   Pulse 71   Temp 98 F (36.7 C) (Oral)   Ht 5\' 11"  (1.803 m)   Wt 206 lb 9.6 oz (93.7 kg)   SpO2 98%   BMI 28.81 kg/m   BP/Weight 10/29/2016 10/22/2016 4/50/3888  Systolic BP 280 034 917  Diastolic BP 80 75 915  Wt. (Lbs) 206.6 205 202.8  BMI 28.81 28.59 28.28      Physical Exam  Constitutional: She is oriented to person, place, and time. She appears well-developed and well-nourished.  Cardiovascular: Normal rate, normal heart sounds and intact distal pulses.   No murmur heard. Pulmonary/Chest: Effort normal and breath sounds normal. She has no wheezes. She has no rales. She exhibits no tenderness.  Abdominal: Soft. Bowel sounds are normal. She exhibits no distension and no mass. There is no tenderness.  Musculoskeletal: Normal range of motion. She exhibits edema (1+ left ankle pitting edema).  Neurological: She is alert and  oriented to person, place, and time.     Assessment & Plan:   1. Essential hypertension Slightly above goal of less than 130/80 Switch from losartan to losartan/HCTZ Continue carvedilol Low-sodium, DASH diet, lifestyle modifications  2. Hypokalemia Commenced on potassium pills as she has had hypokalemia with hydrochlorothiazide in the past   Meds ordered this encounter  Medications  . losartan-hydrochlorothiazide (HYZAAR) 100-25 MG tablet    Sig:  Take 1 tablet by mouth daily.    Dispense:  30 tablet    Refill:  5    Discontinue losartan  . potassium chloride (K-DUR) 10 MEQ tablet    Sig: Take 1 tablet (10 mEq total) by mouth daily.    Dispense:  30 tablet    Refill:  3    Follow-up: Return in about 3 months (around 01/28/2017) for Follow-up on hypertension and hyperlipidemia.   Arnoldo Morale MD

## 2016-11-02 ENCOUNTER — Ambulatory Visit: Payer: 59

## 2016-11-13 ENCOUNTER — Encounter: Payer: Self-pay | Admitting: Family Medicine

## 2016-11-13 ENCOUNTER — Ambulatory Visit: Payer: 59 | Attending: Family Medicine | Admitting: Family Medicine

## 2016-11-13 VITALS — BP 130/80 | HR 74 | Temp 98.0°F | Ht 71.0 in | Wt 202.4 lb

## 2016-11-13 DIAGNOSIS — I10 Essential (primary) hypertension: Secondary | ICD-10-CM | POA: Diagnosis not present

## 2016-11-13 DIAGNOSIS — Z1211 Encounter for screening for malignant neoplasm of colon: Secondary | ICD-10-CM | POA: Diagnosis not present

## 2016-11-13 DIAGNOSIS — Z7982 Long term (current) use of aspirin: Secondary | ICD-10-CM | POA: Insufficient documentation

## 2016-11-13 DIAGNOSIS — R21 Rash and other nonspecific skin eruption: Secondary | ICD-10-CM | POA: Insufficient documentation

## 2016-11-13 DIAGNOSIS — Z882 Allergy status to sulfonamides status: Secondary | ICD-10-CM | POA: Insufficient documentation

## 2016-11-13 DIAGNOSIS — Z0000 Encounter for general adult medical examination without abnormal findings: Secondary | ICD-10-CM

## 2016-11-13 DIAGNOSIS — Z1231 Encounter for screening mammogram for malignant neoplasm of breast: Secondary | ICD-10-CM | POA: Diagnosis not present

## 2016-11-13 DIAGNOSIS — R252 Cramp and spasm: Secondary | ICD-10-CM | POA: Insufficient documentation

## 2016-11-13 DIAGNOSIS — Z91013 Allergy to seafood: Secondary | ICD-10-CM | POA: Diagnosis not present

## 2016-11-13 DIAGNOSIS — Z79899 Other long term (current) drug therapy: Secondary | ICD-10-CM | POA: Insufficient documentation

## 2016-11-13 DIAGNOSIS — Z0001 Encounter for general adult medical examination with abnormal findings: Secondary | ICD-10-CM | POA: Insufficient documentation

## 2016-11-13 DIAGNOSIS — Z9889 Other specified postprocedural states: Secondary | ICD-10-CM | POA: Insufficient documentation

## 2016-11-13 DIAGNOSIS — Z1239 Encounter for other screening for malignant neoplasm of breast: Secondary | ICD-10-CM

## 2016-11-13 NOTE — Patient Instructions (Signed)

## 2016-11-13 NOTE — Progress Notes (Signed)
Subjective:  Patient ID: Kaylee Nelson, female    DOB: 09-25-1955  Age: 61 y.o. MRN: [id number]  CC: Annual Exam   HPI Kaylee Nelson presents for a complete physical exam. She would like a referral to dermatology due to the presence of a chronic hyperpigmented patch on the lower aspect of both legs; she denies pruritus of the lesion.  Past Medical History:  Diagnosis Date  . Hypertension     Past Surgical History:  Procedure Laterality Date  . CESAREAN SECTION      Allergies  Allergen Reactions  . Shrimp [Shellfish Allergy]   . Sulfa Antibiotics      Outpatient Medications Prior to Visit  Medication Sig Dispense Refill  . aspirin EC 81 MG tablet Take 1 tablet (81 mg total) by mouth daily. 100 tablet 1  . carvedilol (COREG) 25 MG tablet Take 1 tablet (25 mg total) by mouth 2 (two) times daily with a meal. 60 tablet 3  . losartan-hydrochlorothiazide (HYZAAR) 100-25 MG tablet Take 1 tablet by mouth daily. 30 tablet 5  . naproxen (NAPROSYN) 500 MG tablet TAKE 1 TABLET BY MOUTH TWICE DAILY WITH MEALS 30 tablet 1  . potassium chloride (K-DUR) 10 MEQ tablet Take 1 tablet (10 mEq total) by mouth daily. 30 tablet 3  . cetirizine (ZYRTEC) 10 MG tablet Take 1 tablet (10 mg total) by mouth daily. (Patient not taking: Reported on 11/13/2016) 30 tablet 2  . olopatadine (PATANOL) 0.1 % ophthalmic solution Place 1 drop into both eyes 2 (two) times daily. (Patient not taking: Reported on 10/29/2016) 5 mL 1   No facility-administered medications prior to visit.     ROS Review of Systems  Constitutional: Negative for activity change, appetite change and fatigue.  HENT: Negative for congestion, sinus pressure and sore throat.   Eyes: Negative for visual disturbance.  Respiratory: Negative for cough, chest tightness, shortness of breath and wheezing.   Cardiovascular: Negative for chest pain and palpitations.  Gastrointestinal: Negative for abdominal distention, abdominal pain and  constipation.  Endocrine: Negative for polydipsia.  Genitourinary: Negative for dysuria and frequency.  Musculoskeletal: Negative for arthralgias and back pain.  Skin: Positive for rash.  Neurological: Negative for tremors, light-headedness and numbness.  Hematological: Does not bruise/bleed easily.  Psychiatric/Behavioral: Negative for agitation and behavioral problems.    Objective:  BP 130/80   Pulse 74   Temp 98 F (36.7 C) (Oral)   Ht 5\' 11"  (1.803 m)   Wt 202 lb 6.4 oz (91.8 kg)   SpO2 98%   BMI 28.23 kg/m   BP/Weight 11/13/2016 10/29/2016 05/11/1446  Systolic BP 185 631 497  Diastolic BP 80 80 75  Wt. (Lbs) 202.4 206.6 205  BMI 28.23 28.81 28.59      Physical Exam  Constitutional: She is oriented to person, place, and time. She appears well-developed and well-nourished. No distress.  HENT:  Head: Normocephalic.  Right Ear: External ear normal.  Left Ear: External ear normal.  Nose: Nose normal.  Mouth/Throat: Oropharynx is clear and moist.  Eyes: Pupils are equal, round, and reactive to light. Conjunctivae and EOM are normal.  Neck: Normal range of motion. No JVD present.  Cardiovascular: Normal rate, regular rhythm, normal heart sounds and intact distal pulses.  Exam reveals no gallop.   No murmur heard. Pulmonary/Chest: Effort normal and breath sounds normal. No respiratory distress. She has no wheezes. She has no rales. She exhibits no tenderness. Right breast exhibits no mass, no skin change and no tenderness.  Left breast exhibits no mass, no skin change and no tenderness.  Abdominal: Soft. Bowel sounds are normal. She exhibits no distension and no mass. There is no tenderness.  Musculoskeletal: Normal range of motion. She exhibits no edema or tenderness.  Neurological: She is alert and oriented to person, place, and time. She has normal reflexes.  Skin: She is not diaphoretic.  Hypopigmented patch on the medial aspects of the lower third of both legs    Psychiatric: She has a normal mood and affect.     Assessment & Plan:   1. Annual physical exam Pap smear not indicated due to partial hysterectomy Counseled on 150 minutes of exercise every week, healthy eating, routine healthcare maintenance. Declines flu shot  2. Screening for breast cancer Referred for mammogram  3. Screening for colon cancer Referred to GI for colonoscopy  4. Rash - Ambulatory referral to Dermatology  5. Muscle cramp Will need to check potassium levels Advised to keep feet warm while going to bed. - Basic Metabolic Panel   No orders of the defined types were placed in this encounter.   Follow-up: Return for Follow-up of chronic medical conditions, keep previously scheduled appointment.   Arnoldo Morale MD

## 2016-11-15 LAB — BASIC METABOLIC PANEL
BUN / CREAT RATIO: 11 — AB (ref 12–28)
BUN: 7 mg/dL — AB (ref 8–27)
CALCIUM: 9.1 mg/dL (ref 8.7–10.3)
CO2: 27 mmol/L (ref 20–29)
Chloride: 104 mmol/L (ref 96–106)
Creatinine, Ser: 0.66 mg/dL (ref 0.57–1.00)
GFR calc Af Amer: 111 mL/min/{1.73_m2} (ref >59)
GFR calc non Af Amer: 96 mL/min/{1.73_m2} (ref >59)
Glucose: 89 mg/dL (ref 65–99)
POTASSIUM: 3.2 mmol/L — AB (ref 3.5–5.2)
Sodium: 146 mmol/L — ABNORMAL HIGH (ref 134–144)

## 2016-11-16 ENCOUNTER — Other Ambulatory Visit: Payer: Self-pay | Admitting: Family Medicine

## 2016-11-16 MED ORDER — POTASSIUM CHLORIDE ER 20 MEQ PO TBCR: 20.0000 meq | EXTENDED_RELEASE_TABLET | Freq: Every day | ORAL | 3 refills | Status: DC

## 2016-11-20 ENCOUNTER — Telehealth: Payer: Self-pay

## 2016-11-20 NOTE — Telephone Encounter (Signed)
Pt was called and informed of lab results. 

## 2016-11-25 ENCOUNTER — Telehealth: Payer: Self-pay | Admitting: Family Medicine

## 2016-11-25 MED ORDER — POTASSIUM CHLORIDE ER 20 MEQ PO TBCR: 20.0000 meq | EXTENDED_RELEASE_TABLET | Freq: Every day | ORAL | 3 refills | Status: DC

## 2016-11-25 NOTE — Telephone Encounter (Signed)
Potassium sent to [facility] on [address]

## 2016-11-25 NOTE — Telephone Encounter (Signed)
Pt called requesting medication potassium chloride 20 MEQ TBCR, to be sent to [facility] on [address] instead of [place] .

## 2016-12-26 ENCOUNTER — Other Ambulatory Visit: Payer: Self-pay | Admitting: Family Medicine

## 2016-12-26 DIAGNOSIS — M79605 Pain in left leg: Secondary | ICD-10-CM

## 2016-12-28 ENCOUNTER — Encounter: Payer: Self-pay | Admitting: Family Medicine

## 2016-12-31 ENCOUNTER — Other Ambulatory Visit: Payer: Self-pay | Admitting: Family Medicine

## 2016-12-31 DIAGNOSIS — I10 Essential (primary) hypertension: Secondary | ICD-10-CM

## 2016-12-31 MED FILL — ?CARVEDILOL 12.5 MG TABLET: 12.5 | 30 days supply | Qty: 60 | Fill #1

## 2017-01-04 ENCOUNTER — Other Ambulatory Visit: Payer: Self-pay | Admitting: Pharmacist

## 2017-01-04 MED ORDER — LOSARTAN POTASSIUM 100 MG PO TABS: 100.0000 mg | ORAL_TABLET | Freq: Every day | ORAL | 0 refills | Status: DC

## 2017-01-04 MED ORDER — HYDROCHLOROTHIAZIDE 25 MG PO TABS: 25.0000 mg | ORAL_TABLET | Freq: Every day | ORAL | 3 refills | Status: DC

## 2017-01-06 ENCOUNTER — Telehealth: Payer: Self-pay | Admitting: Family Medicine

## 2017-01-06 NOTE — Telephone Encounter (Signed)
Patient called stating that [facility] didn't have her medication I sent the call to the pharmacy so that they could fill it here.

## 2017-02-03 ENCOUNTER — Ambulatory Visit: Payer: 59 | Admitting: Family Medicine

## 2017-02-17 ENCOUNTER — Ambulatory Visit: Payer: Self-pay | Admitting: Family Medicine

## 2017-03-14 ENCOUNTER — Other Ambulatory Visit: Payer: Self-pay | Admitting: Family Medicine

## 2017-03-14 DIAGNOSIS — M79605 Pain in left leg: Secondary | ICD-10-CM

## 2017-04-22 ENCOUNTER — Other Ambulatory Visit: Payer: Self-pay | Admitting: Family Medicine

## 2017-05-21 ENCOUNTER — Other Ambulatory Visit: Payer: Self-pay | Admitting: Family Medicine

## 2017-05-25 ENCOUNTER — Telehealth: Payer: Self-pay | Admitting: Family Medicine

## 2017-05-25 ENCOUNTER — Other Ambulatory Visit: Payer: Self-pay

## 2017-05-25 DIAGNOSIS — M79605 Pain in left leg: Secondary | ICD-10-CM

## 2017-05-25 DIAGNOSIS — I10 Essential (primary) hypertension: Secondary | ICD-10-CM

## 2017-05-25 MED ORDER — POTASSIUM CHLORIDE ER 20 MEQ PO TBCR: EXTENDED_RELEASE_TABLET | ORAL | 0 refills | Status: DC

## 2017-05-25 MED ORDER — HYDROCHLOROTHIAZIDE 25 MG PO TABS: 25.0000 mg | ORAL_TABLET | Freq: Every day | ORAL | 3 refills | Status: DC

## 2017-05-25 MED ORDER — NAPROXEN 500 MG PO TABS: 500.0000 mg | ORAL_TABLET | Freq: Two times a day (BID) | ORAL | 1 refills | Status: DC

## 2017-05-25 MED ORDER — CARVEDILOL 25 MG PO TABS: 25.0000 mg | ORAL_TABLET | Freq: Two times a day (BID) | ORAL | 3 refills | Status: DC

## 2017-05-25 NOTE — Telephone Encounter (Signed)
Done

## 2017-05-25 NOTE — Telephone Encounter (Signed)
Patient called and requested for listed medication to be refilled and sent to [facility] on [address]. Patient stated she was leaving out of town Thursday and would like them before then if possible please fu.

## 2017-06-17 ENCOUNTER — Other Ambulatory Visit: Payer: Self-pay | Admitting: Family Medicine

## 2017-06-30 ENCOUNTER — Ambulatory Visit: Payer: Self-pay | Attending: Family Medicine | Admitting: Family Medicine

## 2017-06-30 ENCOUNTER — Encounter: Payer: Self-pay | Admitting: Family Medicine

## 2017-06-30 VITALS — BP 161/71 | HR 78 | Temp 98.2°F | Ht 71.0 in | Wt 195.4 lb

## 2017-06-30 DIAGNOSIS — I10 Essential (primary) hypertension: Secondary | ICD-10-CM | POA: Insufficient documentation

## 2017-06-30 DIAGNOSIS — Z7982 Long term (current) use of aspirin: Secondary | ICD-10-CM | POA: Insufficient documentation

## 2017-06-30 DIAGNOSIS — Z09 Encounter for follow-up examination after completed treatment for conditions other than malignant neoplasm: Secondary | ICD-10-CM | POA: Insufficient documentation

## 2017-06-30 DIAGNOSIS — Z882 Allergy status to sulfonamides status: Secondary | ICD-10-CM | POA: Insufficient documentation

## 2017-06-30 DIAGNOSIS — Z79899 Other long term (current) drug therapy: Secondary | ICD-10-CM | POA: Insufficient documentation

## 2017-06-30 DIAGNOSIS — E876 Hypokalemia: Secondary | ICD-10-CM | POA: Insufficient documentation

## 2017-06-30 DIAGNOSIS — Z91013 Allergy to seafood: Secondary | ICD-10-CM | POA: Insufficient documentation

## 2017-06-30 DIAGNOSIS — R600 Localized edema: Secondary | ICD-10-CM | POA: Insufficient documentation

## 2017-06-30 DIAGNOSIS — Z791 Long term (current) use of non-steroidal anti-inflammatories (NSAID): Secondary | ICD-10-CM | POA: Insufficient documentation

## 2017-06-30 DIAGNOSIS — Z0000 Encounter for general adult medical examination without abnormal findings: Secondary | ICD-10-CM

## 2017-06-30 DIAGNOSIS — R0602 Shortness of breath: Secondary | ICD-10-CM | POA: Insufficient documentation

## 2017-06-30 DIAGNOSIS — Z9889 Other specified postprocedural states: Secondary | ICD-10-CM | POA: Insufficient documentation

## 2017-06-30 MED ORDER — CARVEDILOL 25 MG PO TABS: 25.0000 mg | ORAL_TABLET | Freq: Two times a day (BID) | ORAL | 6 refills | Status: DC

## 2017-06-30 MED ORDER — FUROSEMIDE 20 MG PO TABS: 20.0000 mg | ORAL_TABLET | Freq: Every day | ORAL | 1 refills | Status: DC | PRN

## 2017-06-30 MED ORDER — LOSARTAN POTASSIUM 100 MG PO TABS: 100.0000 mg | ORAL_TABLET | Freq: Every day | ORAL | 6 refills | Status: DC

## 2017-06-30 MED ORDER — POTASSIUM CHLORIDE ER 20 MEQ PO TBCR: EXTENDED_RELEASE_TABLET | ORAL | 6 refills | Status: DC

## 2017-07-01 NOTE — Progress Notes (Signed)
Subjective:  Patient ID: Kaylee Nelson, female    DOB: 1955/07/29  Age: 62 y.o. MRN: [id number]  CC: Hypertension   HPI Kaylee Nelson is a 62 year old female with a history of hypertension who presents today for follow-up visit.  Her blood pressure is elevated and she informs me she has been taking carvedilol once daily rather than twice daily and also taking her losartan pills.  Her last visit to the clinic was several months ago. She continues to have left ankle edema which is more pronounced when she stands and she has been doing a lot of standing at school.  Shortness of breath, orthopnea, weight gain.   She informs me she recently graduated with her masters and is excited. She has no additional concerns today.  Past Medical History:  Diagnosis Date  . Hypertension     Past Surgical History:  Procedure Laterality Date  . CESAREAN SECTION      Allergies  Allergen Reactions  . Shrimp [Shellfish Allergy]   . Sulfa Antibiotics       Outpatient Medications Prior to Visit  Medication Sig Dispense Refill  . naproxen (NAPROSYN) 500 MG tablet Take 1 tablet (500 mg total) by mouth 2 (two) times daily with a meal. 30 tablet 1  . carvedilol (COREG) 25 MG tablet Take 1 tablet (25 mg total) by mouth 2 (two) times daily with a meal. 60 tablet 3  . losartan-hydrochlorothiazide (HYZAAR) 100-25 MG tablet TAKE 1 TABLET BY MOUTH ONCE DAILY DISCONTINUE  LOSARTAN  5  . Potassium Chloride ER 20 MEQ TBCR Take 1 tablet by mouth once daily 30 tablet 0  . olopatadine (PATANOL) 0.1 % ophthalmic solution Place 1 drop into both eyes 2 (two) times daily. (Patient not taking: Reported on 10/29/2016) 5 mL 1  . aspirin EC 81 MG tablet Take 1 tablet (81 mg total) by mouth daily. (Patient not taking: Reported on 06/30/2017) 100 tablet 1  . cetirizine (ZYRTEC) 10 MG tablet Take 1 tablet (10 mg total) by mouth daily. (Patient not taking: Reported on 11/13/2016) 30 tablet 2  . hydrochlorothiazide  (HYDRODIURIL) 25 MG tablet Take 1 tablet (25 mg total) by mouth daily. (Patient not taking: Reported on 06/30/2017) 90 tablet 3  . losartan (COZAAR) 100 MG tablet TAKE 1 TABLET BY MOUTH ONCE DAILY (Patient not taking: Reported on 06/30/2017) 30 tablet 0   No facility-administered medications prior to visit.     ROS Review of Systems  Constitutional: Negative for activity change, appetite change and fatigue.  HENT: Negative for congestion, sinus pressure and sore throat.   Eyes: Negative for visual disturbance.  Respiratory: Negative for cough, chest tightness, shortness of breath and wheezing.   Cardiovascular: Positive for leg swelling. Negative for chest pain and palpitations.  Gastrointestinal: Negative for abdominal distention, abdominal pain and constipation.  Endocrine: Negative for polydipsia.  Genitourinary: Negative for dysuria and frequency.  Musculoskeletal: Negative for arthralgias and back pain.  Skin: Negative for rash.  Neurological: Negative for tremors, light-headedness and numbness.  Hematological: Does not bruise/bleed easily.  Psychiatric/Behavioral: Negative for agitation and behavioral problems.    Objective:  BP (!) 161/71   Pulse 78   Temp 98.2 F (36.8 C) (Oral)   Ht '5\' 11"'  (1.803 m)   Wt 195 lb 6.4 oz (88.6 kg)   SpO2 98%   BMI 27.25 kg/m   BP/Weight 06/30/2017 11/13/2016 3/38/2505  Systolic BP 397 673 419  Diastolic BP 71 80 80  Wt. (Lbs) 195.4 202.4 206.6  BMI 27.25 28.23 28.81      Physical Exam  Constitutional: She is oriented to person, place, and time. She appears well-developed and well-nourished.  Cardiovascular: Normal rate, normal heart sounds and intact distal pulses.  No murmur heard. Pulmonary/Chest: Effort normal and breath sounds normal. She has no wheezes. She has no rales. She exhibits no tenderness.  Abdominal: Soft. Bowel sounds are normal. She exhibits no distension and no mass. There is no tenderness.  Musculoskeletal:  Normal range of motion. She exhibits edema (1+ non pitting L ankle edema).  Neurological: She is alert and oriented to person, place, and time.  Skin: Skin is warm and dry.  Psychiatric: She has a normal mood and affect.     Assessment & Plan:   1. Essential hypertension Uncontrolled Advised on correct dosing of carvedilol Low sodium, DASH diet - losartan (COZAAR) 100 MG tablet; Take 1 tablet (100 mg total) by mouth daily.  Dispense: 30 tablet; Refill: 6 - carvedilol (COREG) 25 MG tablet; Take 1 tablet (25 mg total) by mouth 2 (two) times daily with a meal.  Dispense: 60 tablet; Refill: 6 - CMP14+EGFR; Future - Lipid panel; Future  2. Pedal edema Likely dependent edema She does have a history of hypokalemia and will use furosemide only as needed - furosemide (LASIX) 20 MG tablet; Take 1 tablet (20 mg total) by mouth daily as needed.  Dispense: 30 tablet; Refill: 1  3. Hypokalemia Would love to order labs to check potassium level however she declines today given she does not have the orange card and would not like to be burdened with a bill - Potassium Chloride ER 20 MEQ TBCR; Take 1 tablet by mouth once daily  Dispense: 30 tablet; Refill: 6  4. Health care maintenance She is in need of a mammogram, colonoscopy but declines this today to prevent having to pay out-of-pocket due to lack of medical coverage Advised to apply for the [facility] financial discount/arrange card to facilitate this.   Meds ordered this encounter  Medications  . losartan (COZAAR) 100 MG tablet    Sig: Take 1 tablet (100 mg total) by mouth daily.    Dispense:  30 tablet    Refill:  6    Please consider 90 day supplies to promote better adherence  . Potassium Chloride ER 20 MEQ TBCR    Sig: Take 1 tablet by mouth once daily    Dispense:  30 tablet    Refill:  6    Please consider 90 day supplies to promote better adherence  . carvedilol (COREG) 25 MG tablet    Sig: Take 1 tablet (25 mg total) by mouth 2  (two) times daily with a meal.    Dispense:  60 tablet    Refill:  6  . furosemide (LASIX) 20 MG tablet    Sig: Take 1 tablet (20 mg total) by mouth daily as needed.    Dispense:  30 tablet    Refill:  1    Follow-up: Return in about 6 months (around 12/31/2017) for Follow-up of chronic medical conditions.   Charlott Rakes MD

## 2017-08-06 ENCOUNTER — Other Ambulatory Visit: Payer: Self-pay

## 2017-08-06 ENCOUNTER — Encounter (HOSPITAL_COMMUNITY): Payer: Self-pay | Admitting: Emergency Medicine

## 2017-08-06 ENCOUNTER — Ambulatory Visit (HOSPITAL_COMMUNITY)
Admission: EM | Admit: 2017-08-06 | Discharge: 2017-08-06 | Disposition: A | Discharge status: Home / Self Care | Payer: Medicaid Other | Attending: Family Medicine | Admitting: Family Medicine

## 2017-08-06 DIAGNOSIS — H66002 Acute suppurative otitis media without spontaneous rupture of ear drum, left ear: Secondary | ICD-10-CM | POA: Diagnosis not present

## 2017-08-06 MED ORDER — AMOXICILLIN-POT CLAVULANATE 875-125 MG PO TABS: 1.0000 | ORAL_TABLET | Freq: Two times a day (BID) | ORAL | 0 refills | Status: DC

## 2017-08-06 MED ORDER — AMOXICILLIN-POT CLAVULANATE 875-125 MG PO TABS: 1.0000 | ORAL_TABLET | Freq: Two times a day (BID) | ORAL | 0 refills | Status: AC

## 2017-08-06 NOTE — Discharge Instructions (Signed)
Rest and drink plenty of fluids Prescribed augmentin 875 for 7-10 days Take medications as directed and to completion Continue to use OTC ibuprofen and/ or tylenol as needed for pain control Follow up with PCP if symptoms persists Return here or go to the ER if you have any new or worsening symptoms  

## 2017-08-06 NOTE — ED Provider Notes (Signed)
[place]   [id number] 08/06/17 Arrival Time: 1940  SUBJECTIVE: History from: patient.  Kaylee Nelson is a 62 y.o. female who presents with of left ear pain that began a couple of days ago.  Denies a precipitating event, such as swimming or wearing ear plugs.  Patient states the pain is constant and achy in character.  Patient has tried ear drops with temporary relief.  Symptoms are made worse with cold air.  Denies similar symptoms in the past.   Denies fever, chills, fatigue, sinus pain, rhinorrhea, ear discharge, sore throat, SOB, wheezing, chest pain, nausea, changes in bowel or bladder habits.    ROS: As per HPI.  Past Medical History:  Diagnosis Date  . Hypertension    Past Surgical History:  Procedure Laterality Date  . CESAREAN SECTION     Allergies  Allergen Reactions  . Shrimp [Shellfish Allergy]   . Sulfa Antibiotics    No current facility-administered medications on file prior to encounter.    Current Outpatient Medications on File Prior to Encounter  Medication Sig Dispense Refill  . carvedilol (COREG) 25 MG tablet Take 1 tablet (25 mg total) by mouth 2 (two) times daily with a meal. 60 tablet 6  . furosemide (LASIX) 20 MG tablet Take 1 tablet (20 mg total) by mouth daily as needed. 30 tablet 1  . losartan (COZAAR) 100 MG tablet Take 1 tablet (100 mg total) by mouth daily. 30 tablet 6  . naproxen (NAPROSYN) 500 MG tablet Take 1 tablet (500 mg total) by mouth 2 (two) times daily with a meal. 30 tablet 1  . olopatadine (PATANOL) 0.1 % ophthalmic solution Place 1 drop into both eyes 2 (two) times daily. (Patient not taking: Reported on 10/29/2016) 5 mL 1  . Potassium Chloride ER 20 MEQ TBCR Take 1 tablet by mouth once daily 30 tablet 6   Social History   Socioeconomic History  . Marital status: Married    Spouse name: Not on file  . Number of children: Not on file  . Years of education: Not on file  . Highest education level: Not on file    Occupational History  . Not on file  Social Needs  . Financial resource strain: Not on file  . Food insecurity:    Worry: Not on file    Inability: Not on file  . Transportation needs:    Medical: Not on file    Non-medical: Not on file  Tobacco Use  . Smoking status: Never Smoker  . Smokeless tobacco: Never Used  Substance and Sexual Activity  . Alcohol use: No    Alcohol/week: 0.0 oz  . Drug use: No  . Sexual activity: Never  Lifestyle  . Physical activity:    Days per week: Not on file    Minutes per session: Not on file  . Stress: Not on file  Relationships  . Social connections:    Talks on phone: Not on file    Gets together: Not on file    Attends religious service: Not on file    Active member of club or organization: Not on file    Attends meetings of clubs or organizations: Not on file    Relationship status: Not on file  . Intimate partner violence:    Fear of current or ex partner: Not on file    Emotionally abused: Not on file    Physically abused: Not on file    Forced sexual activity: Not on file  Other Topics Concern  . Not on file  Social History Narrative  . Not on file   Family History  Problem Relation Age of Onset  . Cancer Mother   . Cancer Maternal Aunt   . Cancer Daughter     OBJECTIVE:  Vitals:   08/06/17 1947  BP: (!) 185/87  Pulse: 72  Resp: 18  Temp: 98.4 F (36.9 C)  TempSrc: Oral  SpO2: 100%     General appearance: alert; appears fatigued HEENT: Ears: EACs clear, RT TM pearly gray with visible cone of light, without erythema, LT TM pearly gray with mild injection, no mastoid tenderness or erythema, no tenderness with auricle or tragal manipulation; Eyes: PERRL, EOMI grossly; Sinuses nontender to palpation; Nose: no obvious rhinorrhea; Throat: oropharynx mildly erythematous, tonsils 1+ without white tonsillar exudates, uvula midline Neck: supple without LAD Lungs: CTA bilaterally  Heart: Murmur present, patient report hx  of murmur.  Radial pulses 2+ symmetrical bilaterally Skin: warm and dry Psychological: alert and cooperative; normal mood and affect  ASSESSMENT & PLAN:  1. Non-recurrent acute suppurative otitis media of left ear without spontaneous rupture of tympanic membrane     Meds ordered this encounter  Medications  . DISCONTD: amoxicillin-clavulanate (AUGMENTIN) 875-125 MG tablet    Sig: Take 1 tablet by mouth every 12 (twelve) hours for 10 days.    Dispense:  14 tablet    Refill:  0    Order Specific Question:   Supervising Provider    Answer:   Wynona Luna [phone]  . amoxicillin-clavulanate (AUGMENTIN) 875-125 MG tablet    Sig: Take 1 tablet by mouth every 12 (twelve) hours for 10 days.    Dispense:  20 tablet    Refill:  0    Order Specific Question:   Supervising Provider    Answer:   Wynona Luna [[id number]]    Rest and drink plenty of fluids Prescribed augmentin 875 for 7-10 days Take medications as directed and to completion Continue to use OTC ibuprofen and/ or tylenol as needed for pain control Follow up with PCP if symptoms persists Return here or go to the ER if you have any new or worsening symptoms   Reviewed expectations re: course of current medical issues. Questions answered. Outlined signs and symptoms indicating need for more acute intervention. Patient verbalized understanding. After Visit Summary given.         Lestine Box, PA-C 08/06/17 2058

## 2017-08-06 NOTE — ED Triage Notes (Signed)
The patient presented to the [facility] with a complaint of left ear pain that started earlier today.

## 2017-08-19 ENCOUNTER — Ambulatory Visit: Payer: Self-pay | Admitting: Family Medicine

## 2017-09-13 ENCOUNTER — Other Ambulatory Visit: Payer: Self-pay | Admitting: Family Medicine

## 2017-09-13 DIAGNOSIS — M79605 Pain in left leg: Secondary | ICD-10-CM

## 2017-10-08 DIAGNOSIS — M722 Plantar fascial fibromatosis: Secondary | ICD-10-CM | POA: Diagnosis not present

## 2017-10-14 ENCOUNTER — Other Ambulatory Visit: Payer: Self-pay | Admitting: Family Medicine

## 2017-10-14 DIAGNOSIS — M79605 Pain in left leg: Secondary | ICD-10-CM

## 2017-10-16 ENCOUNTER — Other Ambulatory Visit: Payer: Self-pay | Admitting: Family Medicine

## 2017-10-16 DIAGNOSIS — M79605 Pain in left leg: Secondary | ICD-10-CM

## 2017-10-16 DIAGNOSIS — I10 Essential (primary) hypertension: Secondary | ICD-10-CM

## 2017-10-18 MED ORDER — LOSARTAN POTASSIUM 100 MG PO TABS: 100.0000 mg | ORAL_TABLET | Freq: Every day | ORAL | 0 refills | Status: DC

## 2017-10-18 NOTE — Telephone Encounter (Signed)
Pt called to request a medication refill on -naproxen (NAPROSYN) 500 MG tablet  -losartan (COZAAR) 100 MG tablet To -[place], [place]. Please follow up

## 2017-11-10 ENCOUNTER — Encounter: Payer: Self-pay | Admitting: Family Medicine

## 2017-11-10 ENCOUNTER — Ambulatory Visit: Payer: Medicaid Other | Attending: Family Medicine | Admitting: Family Medicine

## 2017-11-10 VITALS — BP 164/79 | HR 73 | Temp 97.9°F | Ht 71.0 in | Wt 202.8 lb

## 2017-11-10 DIAGNOSIS — Z882 Allergy status to sulfonamides status: Secondary | ICD-10-CM | POA: Insufficient documentation

## 2017-11-10 DIAGNOSIS — Z1211 Encounter for screening for malignant neoplasm of colon: Secondary | ICD-10-CM | POA: Diagnosis not present

## 2017-11-10 DIAGNOSIS — M25562 Pain in left knee: Secondary | ICD-10-CM | POA: Insufficient documentation

## 2017-11-10 DIAGNOSIS — I10 Essential (primary) hypertension: Secondary | ICD-10-CM | POA: Insufficient documentation

## 2017-11-10 DIAGNOSIS — Z1239 Encounter for other screening for malignant neoplasm of breast: Secondary | ICD-10-CM | POA: Diagnosis not present

## 2017-11-10 DIAGNOSIS — Z79899 Other long term (current) drug therapy: Secondary | ICD-10-CM | POA: Insufficient documentation

## 2017-11-10 DIAGNOSIS — M25561 Pain in right knee: Secondary | ICD-10-CM | POA: Insufficient documentation

## 2017-11-10 MED ORDER — CARVEDILOL 25 MG PO TABS: 25.0000 mg | ORAL_TABLET | Freq: Two times a day (BID) | ORAL | 6 refills | Status: DC

## 2017-11-10 MED ORDER — LOSARTAN POTASSIUM 100 MG PO TABS: 100.0000 mg | ORAL_TABLET | Freq: Every day | ORAL | 6 refills | Status: DC

## 2017-11-10 NOTE — Progress Notes (Signed)
Subjective:  Patient ID: Kaylee Nelson, female    DOB: December 30, 1955  Age: 62 y.o. MRN: [id number]  CC: Hypertension   HPI Kaylee Nelson is a 62 year old female with history of hypertension who presents today for follow-up visit.  Her blood pressure is elevated and she informs me at a physical performed at work her systolic blood pressure was in the 140s and she endorses compliance with carvedilol and losartan. In the past she was on amlodipine which had to be discontinued due to pedal edema which has improved since then. She has noticed cramping around the sides and back of her knees bilaterally especially when she rises from a sitting position.  Denies swelling of her knees and denies pain at this time and symptoms usually resolved a few minutes after walking. She does have a history of hypokalemia and has been on potassium tablets.  Past Medical History:  Diagnosis Date  . Hypertension     Past Surgical History:  Procedure Laterality Date  . CESAREAN SECTION      Allergies  Allergen Reactions  . Shrimp [Shellfish Allergy]   . Sulfa Antibiotics      Outpatient Medications Prior to Visit  Medication Sig Dispense Refill  . naproxen (NAPROSYN) 500 MG tablet TAKE 1 TABLET BY MOUTH TWICE DAILY WITH MEALS 30 tablet 0  . Potassium Chloride ER 20 MEQ TBCR Take 1 tablet by mouth once daily 30 tablet 6  . carvedilol (COREG) 25 MG tablet Take 1 tablet (25 mg total) by mouth 2 (two) times daily with a meal. 60 tablet 6  . losartan (COZAAR) 100 MG tablet Take 1 tablet (100 mg total) by mouth daily. 30 tablet 0  . furosemide (LASIX) 20 MG tablet Take 1 tablet (20 mg total) by mouth daily as needed. (Patient not taking: Reported on 11/10/2017) 30 tablet 1  . olopatadine (PATANOL) 0.1 % ophthalmic solution Place 1 drop into both eyes 2 (two) times daily. (Patient not taking: Reported on 10/29/2016) 5 mL 1   No facility-administered medications prior to visit.     ROS Review of Systems    Constitutional: Negative for activity change, appetite change and fatigue.  HENT: Negative for congestion, sinus pressure and sore throat.   Eyes: Negative for visual disturbance.  Respiratory: Negative for cough, chest tightness, shortness of breath and wheezing.   Cardiovascular: Negative for chest pain and palpitations.  Gastrointestinal: Negative for abdominal distention, abdominal pain and constipation.  Endocrine: Negative for polydipsia.  Genitourinary: Negative for dysuria and frequency.  Musculoskeletal:       See hpi  Skin: Negative for rash.  Neurological: Negative for tremors, light-headedness and numbness.  Hematological: Does not bruise/bleed easily.  Psychiatric/Behavioral: Negative for agitation and behavioral problems.    Objective:  BP (!) 164/79   Pulse 73   Temp 97.9 F (36.6 C) (Oral)   Ht '5\' 11"'  (1.803 m)   Wt 202 lb 12.8 oz (92 kg)   SpO2 98%   BMI 28.28 kg/m   BP/Weight 11/10/2017 08/06/2017 0/96/2836  Systolic BP 629 476 546  Diastolic BP 79 87 71  Wt. (Lbs) 202.8 - 195.4  BMI 28.28 - 27.25      Physical Exam  Constitutional: She is oriented to person, place, and time. She appears well-developed and well-nourished.  Cardiovascular: Normal rate, normal heart sounds and intact distal pulses.  No murmur heard. Pulmonary/Chest: Effort normal and breath sounds normal. She has no wheezes. She has no rales. She exhibits no tenderness. Right breast  exhibits no mass and no tenderness. Left breast exhibits no mass and no tenderness.  Abdominal: Soft. Bowel sounds are normal. She exhibits no distension and no mass. There is no tenderness.  Musculoskeletal: Normal range of motion. She exhibits edema (1+ L ankle edema).  Normal appearance of both knees, no tenderness in joint lines bilaterally and normal range of motion bilaterally  Neurological: She is alert and oriented to person, place, and time.  Skin: Skin is warm and dry.  Psychiatric: She has a normal  mood and affect.     Assessment & Plan:   1. Essential hypertension Controlled Systolic blood pressures have been in the 140s outside of this clinic Advised to work on lifestyle modification and if blood pressure still remains elevated at next visit and of antihypertensive will be added Counseled on blood pressure goal of less than 130/80, low-sodium, DASH diet, medication compliance, 150 minutes of moderate intensity exercise per week. Discussed medication compliance, adverse effects. - carvedilol (COREG) 25 MG tablet; Take 1 tablet (25 mg total) by mouth 2 (two) times daily with a meal.  Dispense: 60 tablet; Refill: 6 - losartan (COZAAR) 100 MG tablet; Take 1 tablet (100 mg total) by mouth daily.  Dispense: 30 tablet; Refill: 6 - CMP14+EGFR; Future - Lipid panel; Future  2. Screening for colon cancer - Ambulatory referral to Gastroenterology  3. Screening for breast cancer - MM Digital Screening; Future  4. Acute pain of both knees Pain is absent at this time but seems to be present more on getting up from a sitting position Cannot exclude osteoarthritis Advised to use NSAIDs as needed   Meds ordered this encounter  Medications  . carvedilol (COREG) 25 MG tablet    Sig: Take 1 tablet (25 mg total) by mouth 2 (two) times daily with a meal.    Dispense:  60 tablet    Refill:  6  . losartan (COZAAR) 100 MG tablet    Sig: Take 1 tablet (100 mg total) by mouth daily.    Dispense:  30 tablet    Refill:  6    Follow-up: Return in about 6 months (around 05/12/2018) for Follow-up of chronic medical conditions.   Charlott Rakes MD

## 2017-11-10 NOTE — Patient Instructions (Signed)

## 2017-11-11 ENCOUNTER — Encounter

## 2017-11-27 ENCOUNTER — Other Ambulatory Visit: Payer: Self-pay | Admitting: Family Medicine

## 2017-11-27 DIAGNOSIS — M79605 Pain in left leg: Secondary | ICD-10-CM

## 2017-12-13 ENCOUNTER — Encounter: Payer: Self-pay | Admitting: Family Medicine

## 2018-01-14 ENCOUNTER — Other Ambulatory Visit: Payer: Self-pay | Admitting: Family Medicine

## 2018-01-14 DIAGNOSIS — M79605 Pain in left leg: Secondary | ICD-10-CM

## 2018-04-20 ENCOUNTER — Ambulatory Visit: Payer: Medicaid Other | Attending: Family Medicine | Admitting: Family Medicine

## 2018-04-20 ENCOUNTER — Other Ambulatory Visit: Payer: Self-pay

## 2018-04-20 ENCOUNTER — Encounter: Payer: Self-pay | Admitting: Family Medicine

## 2018-04-20 VITALS — BP 154/70 | HR 83 | Temp 98.1°F | Ht 71.0 in | Wt 197.0 lb

## 2018-04-20 DIAGNOSIS — R109 Unspecified abdominal pain: Secondary | ICD-10-CM | POA: Diagnosis not present

## 2018-04-20 DIAGNOSIS — Z79899 Other long term (current) drug therapy: Secondary | ICD-10-CM | POA: Insufficient documentation

## 2018-04-20 DIAGNOSIS — Z1211 Encounter for screening for malignant neoplasm of colon: Secondary | ICD-10-CM | POA: Diagnosis not present

## 2018-04-20 DIAGNOSIS — I10 Essential (primary) hypertension: Secondary | ICD-10-CM | POA: Insufficient documentation

## 2018-04-20 DIAGNOSIS — K5909 Other constipation: Secondary | ICD-10-CM | POA: Insufficient documentation

## 2018-04-20 DIAGNOSIS — Z91013 Allergy to seafood: Secondary | ICD-10-CM | POA: Insufficient documentation

## 2018-04-20 DIAGNOSIS — Z809 Family history of malignant neoplasm, unspecified: Secondary | ICD-10-CM | POA: Insufficient documentation

## 2018-04-20 DIAGNOSIS — Z791 Long term (current) use of non-steroidal anti-inflammatories (NSAID): Secondary | ICD-10-CM | POA: Insufficient documentation

## 2018-04-20 DIAGNOSIS — M79605 Pain in left leg: Secondary | ICD-10-CM | POA: Diagnosis not present

## 2018-04-20 DIAGNOSIS — E876 Hypokalemia: Secondary | ICD-10-CM

## 2018-04-20 DIAGNOSIS — Z882 Allergy status to sulfonamides status: Secondary | ICD-10-CM | POA: Diagnosis not present

## 2018-04-20 MED ORDER — POTASSIUM CHLORIDE ER 20 MEQ PO TBCR: EXTENDED_RELEASE_TABLET | ORAL | 6 refills | Status: DC

## 2018-04-20 MED ORDER — POLYETHYLENE GLYCOL 3350 17 GM/SCOOP PO POWD: 17.0000 g | Freq: Every day | ORAL | 1 refills | Status: DC

## 2018-04-20 MED ORDER — LOSARTAN POTASSIUM 100 MG PO TABS: 100.0000 mg | ORAL_TABLET | Freq: Every day | ORAL | 6 refills | Status: DC

## 2018-04-20 MED ORDER — CARVEDILOL 25 MG PO TABS: 25.0000 mg | ORAL_TABLET | Freq: Two times a day (BID) | ORAL | 6 refills | Status: DC

## 2018-04-20 MED ORDER — NAPROXEN 500 MG PO TABS: 500.0000 mg | ORAL_TABLET | Freq: Two times a day (BID) | ORAL | 1 refills | Status: DC

## 2018-04-20 NOTE — Progress Notes (Signed)
Subjective:  Patient ID: Kaylee Nelson, female    DOB: 01-06-1956  Age: 63 y.o. MRN: [id number]  CC: Abdominal Pain   HPI Kaylee Nelson is a 63 year old female with history of hypertension who presents today for follow-up visit.  Her blood pressure is elevated and she endorses compliance with her antihypertensive but endorses eating salty foods and fast foods recently. She also complains of periumbilical pain for the last 3 weeks which has now resolved.  Pain was worse when she placed her belt on her umbilicus. She endorses moving her bowels every other day but denies nausea, vomiting, fever. She is on potassium replacement for hypokalemia.  Past Medical History:  Diagnosis Date  . Hypertension     Past Surgical History:  Procedure Laterality Date  . CESAREAN SECTION      Family History  Problem Relation Age of Onset  . Cancer Mother   . Cancer Maternal Aunt   . Cancer Daughter     Allergies  Allergen Reactions  . Shrimp [Shellfish Allergy]   . Sulfa Antibiotics     Outpatient Medications Prior to Visit  Medication Sig Dispense Refill  . olopatadine (PATANOL) 0.1 % ophthalmic solution Place 1 drop into both eyes 2 (two) times daily. 5 mL 1  . carvedilol (COREG) 25 MG tablet Take 1 tablet (25 mg total) by mouth 2 (two) times daily with a meal. 60 tablet 6  . losartan (COZAAR) 100 MG tablet Take 1 tablet (100 mg total) by mouth daily. 30 tablet 6  . naproxen (NAPROSYN) 500 MG tablet TAKE 1 TABLET BY MOUTH TWICE DAILY WITH MEALS 30 tablet 1  . Potassium Chloride ER 20 MEQ TBCR Take 1 tablet by mouth once daily 30 tablet 6  . furosemide (LASIX) 20 MG tablet Take 1 tablet (20 mg total) by mouth daily as needed. (Patient not taking: Reported on 11/10/2017) 30 tablet 1   No facility-administered medications prior to visit.      ROS Review of Systems  Constitutional: Negative for activity change, appetite change and fatigue.  HENT: Negative for congestion, sinus  pressure and sore throat.   Eyes: Negative for visual disturbance.  Respiratory: Negative for cough, chest tightness, shortness of breath and wheezing.   Cardiovascular: Negative for chest pain and palpitations.  Gastrointestinal: Negative for abdominal distention, abdominal pain and constipation.  Endocrine: Negative for polydipsia.  Genitourinary: Negative for dysuria and frequency.  Musculoskeletal: Negative for arthralgias and back pain.  Skin: Negative for rash.  Neurological: Negative for tremors, light-headedness and numbness.  Hematological: Does not bruise/bleed easily.  Psychiatric/Behavioral: Negative for agitation and behavioral problems.    Objective:  BP (!) 154/70   Pulse 83   Temp 98.1 F (36.7 C) (Oral)   Ht '5\' 11"'  (1.803 m)   Wt 197 lb (89.4 kg)   SpO2 98%   BMI 27.48 kg/m   BP/Weight 04/20/2018 66/06/9933 7/0/1779  Systolic BP 390 300 923  Diastolic BP 70 79 87  Wt. (Lbs) 197 202.8 -  BMI 27.48 28.28 -      Physical Exam Constitutional:      Appearance: She is well-developed.  Cardiovascular:     Rate and Rhythm: Normal rate.     Heart sounds: Normal heart sounds. No murmur.  Pulmonary:     Effort: Pulmonary effort is normal.     Breath sounds: Normal breath sounds. No wheezing or rales.  Chest:     Chest wall: No tenderness.  Abdominal:     General:  Bowel sounds are normal. There is no distension.     Palpations: There is no mass.     Tenderness: There is no abdominal tenderness.     Comments: Hard in L mid quadrant, soft in other quadrants  Musculoskeletal: Normal range of motion.  Neurological:     Mental Status: She is alert and oriented to person, place, and time.     CMP Latest Ref Rng & Units 11/13/2016 09/23/2016 05/19/2016  Glucose 65 - 99 mg/dL 89 79 83  BUN 8 - 27 mg/dL 7(L) 10 5(L)  Creatinine 0.57 - 1.00 mg/dL 0.66 0.60 0.55(L)  Sodium 134 - 144 mmol/L 146(H) 147(H) 146(H)  Potassium 3.5 - 5.2 mmol/L 3.2(L) 3.6 3.3(L)  Chloride  96 - 106 mmol/L 104 108(H) 105  CO2 20 - 29 mmol/L '27 26 27  ' Calcium 8.7 - 10.3 mg/dL 9.1 9.2 9.5  Total Protein 6.0 - 8.5 g/dL - 6.4 -  Total Bilirubin 0.0 - 1.2 mg/dL - 0.4 -  Alkaline Phos 39 - 117 IU/L - 90 -  AST 0 - 40 IU/L - 15 -  ALT 0 - 32 IU/L - 10 -    Lipid Panel     Component Value Date/Time   CHOL 200 (H) 12/17/2015 1035   TRIG 51 12/17/2015 1035   HDL 71 12/17/2015 1035   CHOLHDL 2.8 12/17/2015 1035   VLDL 10 12/17/2015 1035   LDLCALC 119 (H) 12/17/2015 1035    CBC    Component Value Date/Time   WBC 4.0 01/10/2014 1253   RBC 4.47 01/10/2014 1253   HGB 11.8 (L) 01/10/2014 1253   HCT 34.0 (L) 01/10/2014 1253   PLT 233 01/10/2014 1253   MCV 76.1 (L) 01/10/2014 1253   MCH 26.4 01/10/2014 1253   MCHC 34.7 01/10/2014 1253   RDW 13.6 01/10/2014 1253    No results found for: HGBA1C  Assessment & Plan:   1. Essential hypertension Uncontrolled Recent ingestion of high sodium foods also contributory. No regimen change today but advised to work on lifestyle modifications Counseled on blood pressure goal of less than 130/80, low-sodium, DASH diet, medication compliance, 150 minutes of moderate intensity exercise per week. Discussed medication compliance, adverse effects. - CMP14+EGFR; Future - Lipid panel; Future - carvedilol (COREG) 25 MG tablet; Take 1 tablet (25 mg total) by mouth 2 (two) times daily with a meal.  Dispense: 60 tablet; Refill: 6 - losartan (COZAAR) 100 MG tablet; Take 1 tablet (100 mg total) by mouth daily.  Dispense: 30 tablet; Refill: 6  2. Leg pain, posterior, left Stable - naproxen (NAPROSYN) 500 MG tablet; Take 1 tablet (500 mg total) by mouth 2 (two) times daily with a meal.  Dispense: 30 tablet; Refill: 1  3. Hypokalemia Controlled - Potassium Chloride ER 20 MEQ TBCR; Take 1 tablet by mouth once daily  Dispense: 30 tablet; Refill: 6  4. Screening for colon cancer - Ambulatory referral to Gastroenterology  5. Other constipation  Increase fiber fiber intake, increase water intake - polyethylene glycol powder (GLYCOLAX/MIRALAX) powder; Take 17 g by mouth daily.  Dispense: 3350 g; Refill: 1   Meds ordered this encounter  Medications  . carvedilol (COREG) 25 MG tablet    Sig: Take 1 tablet (25 mg total) by mouth 2 (two) times daily with a meal.    Dispense:  60 tablet    Refill:  6  . losartan (COZAAR) 100 MG tablet    Sig: Take 1 tablet (100 mg total) by mouth  daily.    Dispense:  30 tablet    Refill:  6  . naproxen (NAPROSYN) 500 MG tablet    Sig: Take 1 tablet (500 mg total) by mouth 2 (two) times daily with a meal.    Dispense:  30 tablet    Refill:  1  . Potassium Chloride ER 20 MEQ TBCR    Sig: Take 1 tablet by mouth once daily    Dispense:  30 tablet    Refill:  6    Please consider 90 day supplies to promote better adherence  . polyethylene glycol powder (GLYCOLAX/MIRALAX) powder    Sig: Take 17 g by mouth daily.    Dispense:  3350 g    Refill:  1    Follow-up: Return in about 6 months (around 10/21/2018) for Follow-up of chronic medical conditions.       Charlott Rakes, MD, FAAFP. [facility] [place], [place]   04/20/2018, 4:03 PM

## 2018-04-21 ENCOUNTER — Ambulatory Visit: Payer: Medicaid Other | Attending: Family Medicine

## 2018-04-21 DIAGNOSIS — I10 Essential (primary) hypertension: Secondary | ICD-10-CM | POA: Diagnosis not present

## 2018-04-22 ENCOUNTER — Telehealth: Payer: Self-pay

## 2018-04-22 LAB — LIPID PANEL
Chol/HDL Ratio: 4.9 ratio — ABNORMAL HIGH (ref 0.0–4.4)
Cholesterol, Total: 212 mg/dL — ABNORMAL HIGH (ref 100–199)
HDL: 43 mg/dL (ref >39)
LDL Calculated: 155 mg/dL — ABNORMAL HIGH (ref 0–99)
TRIGLYCERIDES: 71 mg/dL (ref 0–149)
VLDL Cholesterol Cal: 14 mg/dL (ref 5–40)

## 2018-04-22 LAB — CMP14+EGFR
ALT: 13 IU/L (ref 0–32)
AST: 13 IU/L (ref 0–40)
Albumin/Globulin Ratio: 1.4 (ref 1.2–2.2)
Albumin: 3.6 g/dL — ABNORMAL LOW (ref 3.8–4.8)
Alkaline Phosphatase: 96 IU/L (ref 39–117)
BUN/Creatinine Ratio: 11 — ABNORMAL LOW (ref 12–28)
BUN: 6 mg/dL — ABNORMAL LOW (ref 8–27)
Bilirubin Total: 0.4 mg/dL (ref 0.0–1.2)
CO2: 29 mmol/L (ref 20–29)
Calcium: 8.9 mg/dL (ref 8.7–10.3)
Chloride: 106 mmol/L (ref 96–106)
Creatinine, Ser: 0.53 mg/dL — ABNORMAL LOW (ref 0.57–1.00)
GFR calc Af Amer: 118 mL/min/{1.73_m2} (ref >59)
GFR calc non Af Amer: 102 mL/min/{1.73_m2} (ref >59)
GLOBULIN, TOTAL: 2.5 g/dL (ref 1.5–4.5)
Glucose: 82 mg/dL (ref 65–99)
Potassium: 3.3 mmol/L — ABNORMAL LOW (ref 3.5–5.2)
SODIUM: 147 mmol/L — AB (ref 134–144)
Total Protein: 6.1 g/dL (ref 6.0–8.5)

## 2018-04-22 NOTE — Telephone Encounter (Signed)
-----   Message from Ladell Pier, MD sent at 04/22/2018  1:28 PM EDT ----- Let patient know that her cholesterol level is elevated.  LDL cholesterol is 155 with goal being less than 100.  It is increased compared to 2 years ago.  I recommend starting a cholesterol-lowering medication called Lipitor to help lower the cholesterol.  Prescription sent to her pharmacy.  Kidney function is good.  Potassium level is low.  Please confirm whether she was taking the potassium 20 mg daily as prescribed prior to having the blood test done.  If she has been taking it consistently then we will need to increase the dose from 20 mg daily to 30 mg daily.  Please let me know either way so I know whether to send a new prescription to her pharmacy.

## 2018-04-22 NOTE — Telephone Encounter (Signed)
Patient was called and a voicemail was left informing patient to return phone call for lab results. 

## 2018-04-25 ENCOUNTER — Telehealth: Payer: Self-pay

## 2018-04-25 ENCOUNTER — Other Ambulatory Visit: Payer: Self-pay | Admitting: Gastroenterology

## 2018-04-25 DIAGNOSIS — R1033 Periumbilical pain: Secondary | ICD-10-CM | POA: Diagnosis not present

## 2018-04-25 DIAGNOSIS — R1902 Left upper quadrant abdominal swelling, mass and lump: Secondary | ICD-10-CM

## 2018-04-25 DIAGNOSIS — R194 Change in bowel habit: Secondary | ICD-10-CM | POA: Diagnosis not present

## 2018-04-25 DIAGNOSIS — Z1211 Encounter for screening for malignant neoplasm of colon: Secondary | ICD-10-CM | POA: Diagnosis not present

## 2018-04-25 NOTE — Telephone Encounter (Signed)
-----   Message from Ladell Pier, MD sent at 04/22/2018  1:28 PM EDT ----- Let patient know that her cholesterol level is elevated.  LDL cholesterol is 155 with goal being less than 100.  It is increased compared to 2 years ago.  I recommend starting a cholesterol-lowering medication called Lipitor to help lower the cholesterol.  Prescription sent to her pharmacy.  Kidney function is good.  Potassium level is low.  Please confirm whether she was taking the potassium 20 mg daily as prescribed prior to having the blood test done.  If she has been taking it consistently then we will need to increase the dose from 20 mg daily to 30 mg daily.  Please let me know either way so I know whether to send a new prescription to her pharmacy.

## 2018-04-25 NOTE — Telephone Encounter (Signed)
Patient name and DOB has been verified Patient was informed of lab results. Patient had no questions.  Patient states that she is not currently taking potassium medication as she should. Patient informed me that she will resume the medication.

## 2018-04-28 ENCOUNTER — Other Ambulatory Visit: Payer: Self-pay

## 2018-04-28 ENCOUNTER — Ambulatory Visit
Admission: RE | Admit: 2018-04-28 | Discharge: 2018-04-28 | Disposition: A | Discharge status: Home / Self Care | Payer: Medicaid Other | Source: Ambulatory Visit | Attending: Gastroenterology | Admitting: Gastroenterology

## 2018-04-28 ENCOUNTER — Telehealth: Payer: Self-pay | Admitting: Family Medicine

## 2018-04-28 DIAGNOSIS — R1902 Left upper quadrant abdominal swelling, mass and lump: Secondary | ICD-10-CM

## 2018-04-28 MED ORDER — IOPAMIDOL (ISOVUE-300) INJECTION 61%
100.0000 mL | Freq: Once | INTRAVENOUS | Status: AC | PRN
  Administered 2018-04-28: 100 mL via INTRAVENOUS

## 2018-04-28 NOTE — Telephone Encounter (Signed)
Patient called in regards to her CT results. Patient states she received her results and would like speak to PCP regarding those results. Please follow up.

## 2018-04-28 NOTE — Telephone Encounter (Signed)
Will route to PCP for review. 

## 2018-04-29 ENCOUNTER — Telehealth: Payer: Self-pay

## 2018-04-29 MED ORDER — ATORVASTATIN CALCIUM 20 MG PO TABS: 20.0000 mg | ORAL_TABLET | Freq: Every day | ORAL | 3 refills | Status: DC

## 2018-04-29 NOTE — Telephone Encounter (Signed)
Patient was called and informed of medication.

## 2018-04-29 NOTE — Telephone Encounter (Signed)
Lipitor sent to pharmacy.

## 2018-04-29 NOTE — Telephone Encounter (Signed)
Patient called and states that Lipitor medication has not been sent to pharmacy. Patient was prescribed medication By Wynetta Emery while you were out. Please follow up.

## 2018-05-02 ENCOUNTER — Other Ambulatory Visit: Payer: Medicaid Other

## 2018-05-03 ENCOUNTER — Other Ambulatory Visit: Payer: Self-pay | Admitting: Gastroenterology

## 2018-05-03 DIAGNOSIS — R1033 Periumbilical pain: Secondary | ICD-10-CM

## 2018-05-03 DIAGNOSIS — R911 Solitary pulmonary nodule: Secondary | ICD-10-CM

## 2018-05-03 DIAGNOSIS — R1902 Left upper quadrant abdominal swelling, mass and lump: Secondary | ICD-10-CM

## 2018-05-09 DIAGNOSIS — E278 Other specified disorders of adrenal gland: Secondary | ICD-10-CM | POA: Diagnosis not present

## 2018-05-23 DIAGNOSIS — E278 Other specified disorders of adrenal gland: Secondary | ICD-10-CM | POA: Diagnosis not present

## 2018-05-25 ENCOUNTER — Other Ambulatory Visit: Payer: Self-pay | Admitting: Surgery

## 2018-05-25 DIAGNOSIS — E278 Other specified disorders of adrenal gland: Secondary | ICD-10-CM

## 2018-06-07 ENCOUNTER — Ambulatory Visit
Admission: RE | Admit: 2018-06-07 | Discharge: 2018-06-07 | Disposition: A | Discharge status: Home / Self Care | Payer: Medicaid Other | Source: Ambulatory Visit | Attending: Surgery | Admitting: Surgery

## 2018-06-07 ENCOUNTER — Other Ambulatory Visit: Payer: Self-pay

## 2018-06-07 DIAGNOSIS — E278 Other specified disorders of adrenal gland: Secondary | ICD-10-CM

## 2018-06-07 MED ORDER — GADOBENATE DIMEGLUMINE 529 MG/ML IV SOLN
18.0000 mL | Freq: Once | INTRAVENOUS | Status: AC | PRN
  Administered 2018-06-07: 18 mL via INTRAVENOUS

## 2018-06-08 ENCOUNTER — Ambulatory Visit: Payer: Self-pay | Admitting: Surgery

## 2018-06-08 NOTE — H&P (Signed)
General Surgery [facility]  Kaylee Nelson DOB: Oct 07, 1955 Single / Language: Kaylee Nelson / Race: Black or African American Female   History of Present Illness   The patient is a 63 year old female who presents wtih an adrenal mass.  CHIEF COMPLAINT: left adrenal mass  Patient is referred by Dr. Otis Brace for surgical evaluation and management of left adrenal mass. Patient's primary care physician is Dr. Charlott Rakes. Patient initially presented to her primary care physician in February 2020 with pain at the level of the umbilicus. She was referred to gastroenterology. She was having problems with constipation. On physical examination she was noted to have a mass. She underwent a CT scan of the abdomen and pelvis on April 28, 2018. This demonstrated a heterogeneous mass arising from the left retroperitoneum measuring 25.7 x 16.1 x 14.3 cm. 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 This mass displaces and abuts the upper left kidney, pancreatic tail, and spleen. There was no evidence of metastatic disease in the abdomen. Patient has had prior cesarean section. She has a history of hypertension which is well-controlled on medication. Patient has had recent constipation but this is resolved with the addition of MiraLAX. There is no family history of endocrine neoplasms. Patient presents today for surgical evaluation for resection for definitive diagnosis and management.   Diagnostic Studies History Colonoscopy  >10 years ago   Allergies Sulfa Antibiotics  Folic Acid *HEMATOPOIETIC AGENTS*  Allergies Reconciled   Medication History Carvedilol (25MG  Tablet, Oral) Active. Losartan Potassium (100MG  Tablet, Oral) Active. Potassium Chloride ER (20MEQ Tablet ER, Oral) Active. Medications Reconciled  Social History Caffeine use  Tea. No alcohol use  No drug use  Tobacco use  Never smoker.  Family History Alcohol Abuse   Father. Arthritis  Mother. Breast Cancer  Daughter, Family Members In General, Mother. Diabetes Mellitus  Son. Hypertension  Daughter, Family Members In [place], Mother, Son.  Other Problems  High blood pressure   Review of Systems  General Not Present- Appetite Loss, Chills, Fatigue, Fever, Night Sweats, Weight Gain and Weight Loss. Cardiovascular Not Present- Chest Pain, Difficulty Breathing Lying Down, Leg Cramps, Palpitations, Rapid Heart Rate, Shortness of Breath and Swelling of Extremities. Gastrointestinal Not Present- Abdominal Pain, Bloating, Bloody Stool, Change in Bowel Habits, Chronic diarrhea, Constipation, Difficulty Swallowing, Excessive gas, Gets full quickly at meals, Hemorrhoids, Indigestion, Nausea, Rectal Pain and Vomiting. Musculoskeletal Not Present- Back Pain, Joint Pain, Joint Stiffness, Muscle Pain, Muscle Weakness and Swelling of Extremities. Neurological Not Present- Decreased Memory, Fainting, Headaches, Numbness, Seizures, Tingling, Tremor, Trouble walking and Weakness. Psychiatric Not Present- Anxiety, Bipolar, Change in Sleep Pattern, Depression, Fearful and Frequent crying. Endocrine Not Present- Cold Intolerance, Excessive Hunger, Hair Changes, Heat Intolerance, Hot flashes and New Diabetes. Hematology Not Present- Blood Thinners, Easy Bruising, Excessive bleeding, Gland problems, HIV and Persistent Infections.  Vitals  Weight: 194.6 lb Height: 68in Body Surface Area: 2.02 m Body Mass Index: 29.59 kg/m  Temp.: 98.46F  Pulse: 94 (Regular)  BP: 138/84 (Sitting, Left Arm, Standard)  Physical Exam  See vital signs recorded above  GENERAL APPEARANCE Development: normal Nutritional status: normal Gross deformities: none  SKIN Rash, lesions, ulcers: none Induration, erythema: none Nodules: none palpable  EYES Conjunctiva and lids: normal Pupils: equal and reactive Iris: normal bilaterally  EARS, NOSE, MOUTH, THROAT External  ears: no lesion or deformity External nose: no lesion or deformity Hearing: grossly normal Lips: no lesion or deformity Dentition: normal for age Oral mucosa: moist  NECK Symmetric:  yes Trachea: midline Thyroid: no palpable nodules in the thyroid bed  CHEST Respiratory effort: normal Retraction or accessory muscle use: no Breath sounds: normal bilaterally Rales, rhonchi, wheeze: none  CARDIOVASCULAR Auscultation: regular rhythm, normal rate Murmurs: none Pulses: carotid and radial pulse 2+ palpable Lower extremity edema: none Lower extremity varicosities: none  ABDOMEN Distension: none Masses: Large firm nodular mass left upper quadrant of the abdomen extending below the level of the umbilicus on the left side. It extends up to the costal margin. It is nontender. Tenderness: none Hepatosplenomegaly: not present Hernia: No sign of hernia. Well-healed lower midline incision.  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 This mass is quite large, measuring 25 cm in greatest dimension. CT scan is reviewed and this appears to displace the liver, stomach, spleen, and pancreas but does not show any evidence of direct invasion or involvement.  I discussed surgical removal at length with the patient today. We  discussed the fact that we would not like to obtain a biopsy prior to surgery due to the risk of bleeding or causing dissemination of the tumor within the abdomen. I would like to obtain some additional laboratory studies and possibly some 24-hour urine studies to rule out other etiologies. We will arrange for the studies in the near future. Once they are completed, we will proceed with scheduling her for surgery.  We discussed performing the surgery at [facility] Patient will require preoperative evaluation by anesthesiology. Patient will undergo the procedure with general anesthesia through an open abdominal incision in the midline. Surgery will probably last 2-3 hours. Patient should plan on being admitted to the hospital for 3-5 days following her procedure.  Patient understands the above plan and agrees to proceed. We will arrange for laboratory testing in the near future. Once those results are back, I will contact the patient and we will proceed with scheduling her surgery at a time convenient for her in the near future.  The risks and benefits of the procedure have been discussed at length with the patient. The patient understands the proposed procedure, potential alternative treatments, and the course of recovery to be expected. All of the patient's questions have been answered at this time. The patient wishes to proceed with surgery.  Armandina Gemma, [facility] Office: [phone]

## 2018-07-29 ENCOUNTER — Other Ambulatory Visit: Payer: Self-pay | Admitting: Family Medicine

## 2018-07-29 DIAGNOSIS — M79605 Pain in left leg: Secondary | ICD-10-CM

## 2018-08-09 ENCOUNTER — Other Ambulatory Visit (HOSPITAL_COMMUNITY): Payer: Self-pay | Admitting: *Deleted

## 2018-08-09 NOTE — Patient Instructions (Addendum)
YOU NEED TO HAVE A COVID 19 TEST ON___Monday 7/13____ @__10 :o5_____, THIS TEST MUST BE DONE BEFORE SURGERY, COME TO [place] ENTRANCE. ONCE YOUR COVID TEST IS COMPLETED, PLEASE BEGIN THE QUARANTINE INSTRUCTIONS AS OUTLINED IN YOUR HANDOUT.                Mccayla Shimada    Your procedure is scheduled on: 08-18-2018   Report to [facility] Main  Entrance report to admitting at 7:30 AM      Call this number if you have problems the morning of surgery [phone]     Remember: Do not eat food or drink liquids :After Midnight.            BRUSH YOUR TEETH MORNING OF SURGERY AND RINSE YOUR MOUTH OUT, NO CHEWING GUM CANDY OR MINTS.     Take these medicines the morning of surgery with A SIP OF WATER: Coreg                               You may not have any metal on your body including hair pins and              piercings              Do not wear jewelry, make-up, lotions, powders or perfumes, deodorant             Do not wear nail polish.  Do not shave  48 hours prior to surgery.                 Do not bring valuables to the hospital. [place].  Contacts, dentures or bridgework may not be worn into surgery.       _____________________________________________________________________             [facility] - Preparing for Surgery  Before surgery, you can play an important role .  Because skin is not sterile, your skin needs to be as free of germs as possible.   You can reduce the number of germs on your skin by washing with CHG (chlorahexidine gluconate) soap before surgery.   CHG is an antiseptic cleaner which kills germs and bonds with the skin to continue killing germs even after washing. Please DO NOT use if you have an allergy to CHG or antibacterial soaps .  If your skin becomes reddened/irritated stop using the CHG and inform your nurse when you arrive at Short Stay. Do not shave  (including legs and underarms) for at least 48 hours prior to the first CHG shower.  Please follow these instructions carefully:  1.  Shower with CHG Soap the night before surgery and the  morning of Surgery.  2.  If you choose to wash your hair, wash your hair first as usual with your  normal  shampoo.  3.  After you shampoo, rinse your hair and body thoroughly to remove the  shampoo.                                        4.  Use CHG as you would any other liquid soap.  You can apply chg directly  to the skin and wash  Gently with a scrungie or clean washcloth.  5.  Apply the CHG Soap to your body ONLY FROM THE NECK DOWN.   Do not use on face/ open                           Wound or open sores. Avoid contact with eyes, ears mouth and genitals (private parts).                       Wash face,  Genitals (private parts) with your normal soap.             6.  Wash thoroughly, paying special attention to the area where your surgery  will be performed.  7.  Thoroughly rinse your body with warm water from the neck down.  8.  DO NOT shower/wash with your normal soap after using and rinsing off  the CHG Soap.             9.  Pat yourself dry with a clean towel.            10.  Wear clean pajamas.            11.  Place clean sheets on your bed the night of your first shower and do not  sleep with pets.  Day of Surgery : Do not apply any lotions/deodorants the morning of surgery.  Please wear clean clothes to the hospital/surgery center.   FAILURE TO FOLLOW THESE INSTRUCTIONS MAY RESULT IN THE CANCELLATION OF YOUR SURGERY PATIENT SIGNATURE_________________________________  NURSE SIGNATURE__________________________________  ______  Adam Phenix  An incentive spirometer is a tool that can help keep your lungs clear and active. This tool measures how well you are filling your lungs with each breath. Taking long deep breaths may help reverse or decrease the chance of  developing breathing (pulmonary) problems (especially infection) following:  A long period of time when you are unable to move or be active. BEFORE THE PROCEDURE   If the spirometer includes an indicator to show your best effort, your nurse or respiratory therapist will set it to a desired goal.  If possible, sit up straight or lean slightly forward. Try not to slouch.  Hold the incentive spirometer in an upright position. INSTRUCTIONS FOR USE  1. Sit on the edge of your bed if possible, or sit up as far as you can in bed or on a chair. 2. Hold the incentive spirometer in an upright position. 3. Breathe out normally. 4. Place the mouthpiece in your mouth and seal your lips tightly around it. 5. Breathe in slowly and as deeply as possible, raising the piston or the ball toward the top of the column. 6. Hold your breath for 3-5 seconds or for as long as possible. Allow the piston or ball to fall to the bottom of the column. 7. Remove the mouthpiece from your mouth and breathe out normally. 8. Rest for a few seconds and repeat Steps 1 through 7 at least 10 times every 1-2 hours when you are awake. Take your time and take a few normal breaths between deep breaths. 9. The spirometer may include an indicator to show your best effort. Use the indicator as a goal to work toward during each repetition. 10. After each set of 10 deep breaths, practice coughing to be sure your lungs are clear. If you have an incision (the cut made at the time of surgery), support your  incision when coughing by placing a pillow or rolled up towels firmly against it. Once you are able to get out of bed, walk around indoors and cough well. You may stop using the incentive spirometer when instructed by your caregiver.  RISKS AND COMPLICATIONS  Take your time so you do not get dizzy or light-headed.  If you are in pain, you may need to take or ask for pain medication before doing incentive spirometry. It is harder to take a  deep breath if you are having pain. AFTER USE  Rest and breathe slowly and easily.  It can be helpful to keep track of a log of your progress. Your caregiver can provide you with a simple table to help with this. If you are using the spirometer at home, follow these instructions: [place] IF:   You are having difficultly using the spirometer.  You have trouble using the spirometer as often as instructed.  Your pain medication is not giving enough relief while using the spirometer.  You develop fever of 100.5 F (38.1 C) or higher. SEEK IMMEDIATE MEDICAL CARE IF:   You cough up bloody sputum that had not been present before.  You develop fever of 102 F (38.9 C) or greater.  You develop worsening pain at or near the incision site. MAKE SURE YOU:   Understand these instructions.  Will watch your condition.  Will get help right away if you are not doing well or get worse. Document Released: 06/01/2006 Document Revised: 04/13/2011 Document Reviewed: 08/02/2006 ExitCare Patient Information 2014 Exit[employer], [place].   ________________________________________________________________________ __________________________________________________________________  WHAT IS A BLOOD TRANSFUSION? Blood Transfusion Information  A transfusion is the replacement of blood or some of its parts. Blood is made up of multiple cells which provide different functions.  Red blood cells carry oxygen and are used for blood loss replacement.  White blood cells fight against infection.  Platelets control bleeding.  Plasma helps clot blood.  Other blood products are available for specialized needs, such as hemophilia or other clotting disorders. BEFORE THE TRANSFUSION  Who gives blood for transfusions?   Healthy volunteers who are fully evaluated to make sure their blood is safe. This is blood bank blood. Transfusion therapy is the safest it has ever been in the practice of medicine.  Before blood is taken from a donor, a complete history is taken to make sure that person has no history of diseases nor engages in risky social behavior (examples are intravenous drug use or sexual activity with multiple partners). The donor's travel history is screened to minimize risk of transmitting infections, such as malaria. The donated blood is tested for signs of infectious diseases, such as HIV and hepatitis. The blood is then tested to be sure it is compatible with you in order to minimize the chance of a transfusion reaction. If you or a relative donates blood, this is often done in anticipation of surgery and is not appropriate for emergency situations. It takes many days to process the donated blood. RISKS AND COMPLICATIONS Although transfusion therapy is very safe and saves many lives, the main dangers of transfusion include:   Getting an infectious disease.  Developing a transfusion reaction. This is an allergic reaction to something in the blood you were given. Every precaution is taken to prevent this. The decision to have a blood transfusion has been considered carefully by your caregiver before blood is given. Blood is not given unless the benefits outweigh the risks. AFTER THE TRANSFUSION  Right after receiving a blood transfusion, you will usually feel much better and more energetic. This is especially true if your red blood cells have gotten low (anemic). The transfusion raises the level of the red blood cells which carry oxygen, and this usually causes an energy increase.  The nurse administering the transfusion will monitor you carefully for complications. HOME CARE INSTRUCTIONS  No special instructions are needed after a transfusion. You may find your energy is better. Speak with your caregiver about any limitations on activity for underlying diseases you may have. SEEK MEDICAL CARE IF:   Your condition is not improving after your transfusion.  You develop redness or  irritation at the intravenous (IV) site. SEEK IMMEDIATE MEDICAL CARE IF:  Any of the following symptoms occur over the next 12 hours:  Shaking chills.  You have a temperature by mouth above 102 F (38.9 C), not controlled by medicine.  Chest, back, or muscle pain.  People around you feel you are not acting correctly or are confused.  Shortness of breath or difficulty breathing.  Dizziness and fainting.  You get a rash or develop hives.  You have a decrease in urine output.  Your urine turns a dark color or changes to pink, red, or brown. Any of the following symptoms occur over the next 10 days:  You have a temperature by mouth above 102 F (38.9 C), not controlled by medicine.  Shortness of breath.  Weakness after normal activity.  The white part of the eye turns yellow (jaundice).  You have a decrease in the amount of urine or are urinating less often.  Your urine turns a dark color or changes to pink, red, or brown. Document Released: 01/17/2000 Document Revised: 04/13/2011 Document Reviewed: 09/05/2007 [facility] Patient Information 2014 [place], [place].  _______________________________________________________________________

## 2018-08-10 ENCOUNTER — Encounter (HOSPITAL_COMMUNITY)
Admission: RE | Admit: 2018-08-10 | Discharge: 2018-08-10 | Disposition: A | Discharge status: Home / Self Care | Payer: Medicaid Other | Source: Ambulatory Visit | Attending: Surgery | Admitting: Surgery

## 2018-08-10 ENCOUNTER — Other Ambulatory Visit: Payer: Self-pay

## 2018-08-10 ENCOUNTER — Encounter (HOSPITAL_COMMUNITY): Payer: Self-pay

## 2018-08-10 DIAGNOSIS — E279 Disorder of adrenal gland, unspecified: Secondary | ICD-10-CM | POA: Insufficient documentation

## 2018-08-10 DIAGNOSIS — I10 Essential (primary) hypertension: Secondary | ICD-10-CM | POA: Insufficient documentation

## 2018-08-10 DIAGNOSIS — Z01818 Encounter for other preprocedural examination: Secondary | ICD-10-CM | POA: Diagnosis not present

## 2018-08-10 HISTORY — DX: Cardiac murmur, unspecified: R01.1

## 2018-08-10 HISTORY — DX: Sickle-cell trait: D57.3

## 2018-08-10 LAB — CBC
HCT: 35.6 % — ABNORMAL LOW (ref 36.0–46.0)
Hemoglobin: 11.3 g/dL — ABNORMAL LOW (ref 12.0–15.0)
MCH: 26.4 pg (ref 26.0–34.0)
MCHC: 31.7 g/dL (ref 30.0–36.0)
MCV: 83.2 fL (ref 80.0–100.0)
Platelets: 114 10*3/uL — ABNORMAL LOW (ref 150–400)
RBC: 4.28 MIL/uL (ref 3.87–5.11)
RDW: 16.1 % — ABNORMAL HIGH (ref 11.5–15.5)
WBC: 4.1 10*3/uL (ref 4.0–10.5)
nRBC: 0 % (ref 0.0–0.2)

## 2018-08-10 LAB — BASIC METABOLIC PANEL
Anion gap: 8 (ref 5–15)
BUN: 7 mg/dL — ABNORMAL LOW (ref 8–23)
CO2: 26 mmol/L (ref 22–32)
Calcium: 9 mg/dL (ref 8.9–10.3)
Chloride: 112 mmol/L — ABNORMAL HIGH (ref 98–111)
Creatinine, Ser: 0.59 mg/dL (ref 0.44–1.00)
GFR calc Af Amer: >60 mL/min (ref >60)
GFR calc non Af Amer: >60 mL/min (ref >60)
Glucose, Bld: 92 mg/dL (ref 70–99)
Potassium: 3.4 mmol/L — ABNORMAL LOW (ref 3.5–5.1)
Sodium: 146 mmol/L — ABNORMAL HIGH (ref 135–145)

## 2018-08-10 LAB — ABO/RH: ABO/RH(D): O POS

## 2018-08-15 ENCOUNTER — Other Ambulatory Visit (HOSPITAL_COMMUNITY)
Admission: RE | Admit: 2018-08-15 | Discharge: 2018-08-15 | Disposition: A | Discharge status: Home / Self Care | Payer: Medicaid Other | Source: Ambulatory Visit | Attending: Surgery | Admitting: Surgery

## 2018-08-15 DIAGNOSIS — Z1159 Encounter for screening for other viral diseases: Secondary | ICD-10-CM | POA: Diagnosis not present

## 2018-08-16 ENCOUNTER — Encounter (HOSPITAL_COMMUNITY): Payer: Self-pay | Admitting: *Deleted

## 2018-08-16 LAB — SARS CORONAVIRUS 2 (TAT 6-24 HRS): SARS Coronavirus 2: NEGATIVE

## 2018-08-16 NOTE — Progress Notes (Signed)
PATIENT CALLED TO REVIEW MEDS TO TAKE AM OF SURGERY WITH SIP OF WATER: CARVEDILOL. PATIENT ALSO REPORTS YELLOW PHLEGM WITH NO COUGH X 1 TO 2 WEEKS WITH NO FEVER, NOTED YELLOW PHLEGM IN MEDICAL HISTORY AND PATIENT INSTRUCTED TO MAKE STAY STAY STAFF AWARE OF YELLOW PHLEGM DAY OF SURGERY AND TO CALL DR Harlow Asa IF ANY TEMPERATURE 100.0 OR GREATER.

## 2018-08-17 ENCOUNTER — Encounter (HOSPITAL_COMMUNITY): Payer: Self-pay | Admitting: Surgery

## 2018-08-17 DIAGNOSIS — E278 Other specified disorders of adrenal gland: Secondary | ICD-10-CM | POA: Diagnosis present

## 2018-08-17 NOTE — H&P (Signed)
General Surgery [facility]  Kaylee Nelson DOB: 02/27/1955 Single / Language: [name] / Race: Black or African American Female   History of Present Illness  The patient is a 63 year old female who presents wtih an adrenal mass.  CHIEF COMPLAINT: left adrenal mass  Patient is referred by Dr. Otis Brace for surgical evaluation and management of left adrenal mass. Patient's primary care physician is Dr. Charlott Rakes. Patient initially presented to her primary care physician in February 2020 with pain at the level of the umbilicus. She was referred to gastroenterology. She was having problems with constipation. On physical examination she was noted to have a mass. She underwent a CT scan of the abdomen and pelvis on April 28, 2018. This demonstrated a heterogeneous mass arising from the left retroperitoneum measuring 25.7 x 16.1 x 14.3 cm. 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 This mass displaces and abuts the upper left kidney, pancreatic tail, and spleen. There was no evidence of metastatic disease in the abdomen. Patient has had prior cesarean section. She has a history of hypertension which is well-controlled on medication. Patient has had recent constipation but this is resolved with the addition of MiraLAX. There is no family history of endocrine neoplasms. Patient presents today for surgical evaluation for resection for definitive diagnosis and management.   Diagnostic Studies History Colonoscopy  >10 years ago  Allergies  Sulfa Antibiotics  Folic Acid *HEMATOPOIETIC AGENTS*  Allergies Reconciled   Medication History  Carvedilol (25MG  Tablet, Oral) Active. Losartan Potassium (100MG  Tablet, Oral) Active. Potassium Chloride ER (20MEQ Tablet ER, Oral) Active. Medications Reconciled  Social History  Caffeine use  Tea. No alcohol use  No drug use  Tobacco use  Never smoker.  Family History Alcohol Abuse   Father. Arthritis  Mother. Breast Cancer  Daughter, Family Members In General, Mother. Diabetes Mellitus  Son. Hypertension  Daughter, Family Members In [place], Mother, Son.  Other Problems High blood pressure   Review of Systems General Not Present- Appetite Loss, Chills, Fatigue, Fever, Night Sweats, Weight Gain and Weight Loss. Cardiovascular Not Present- Chest Pain, Difficulty Breathing Lying Down, Leg Cramps, Palpitations, Rapid Heart Rate, Shortness of Breath and Swelling of Extremities. Gastrointestinal Not Present- Abdominal Pain, Bloating, Bloody Stool, Change in Bowel Habits, Chronic diarrhea, Constipation, Difficulty Swallowing, Excessive gas, Gets full quickly at meals, Hemorrhoids, Indigestion, Nausea, Rectal Pain and Vomiting. Musculoskeletal Not Present- Back Pain, Joint Pain, Joint Stiffness, Muscle Pain, Muscle Weakness and Swelling of Extremities. Neurological Not Present- Decreased Memory, Fainting, Headaches, Numbness, Seizures, Tingling, Tremor, Trouble walking and Weakness. Psychiatric Not Present- Anxiety, Bipolar, Change in Sleep Pattern, Depression, Fearful and Frequent crying. Endocrine Not Present- Cold Intolerance, Excessive Hunger, Hair Changes, Heat Intolerance, Hot flashes and New Diabetes. Hematology Not Present- Blood Thinners, Easy Bruising, Excessive bleeding, Gland problems, HIV and Persistent Infections.  Vitals Weight: 194.6 lb Height: 68in Body Surface Area: 2.02 m Body Mass Index: 29.59 kg/m  Temp.: 98.31F  Pulse: 94 (Regular)  BP: 138/84(Sitting, Left Arm, Standard)  Physical Exam  See vital signs recorded above  GENERAL APPEARANCE Development: normal Nutritional status: normal Gross deformities: none  SKIN Rash, lesions, ulcers: none Induration, erythema: none Nodules: none palpable  EYES Conjunctiva and lids: normal Pupils: equal and reactive Iris: normal bilaterally  EARS, NOSE, MOUTH, THROAT External ears:  no lesion or deformity External nose: no lesion or deformity Hearing: grossly normal Lips: no lesion or deformity Dentition: normal for age Oral mucosa: moist  NECK Symmetric: yes Trachea: midline  Thyroid: no palpable nodules in the thyroid bed  CHEST Respiratory effort: normal Retraction or accessory muscle use: no Breath sounds: normal bilaterally Rales, rhonchi, wheeze: none  CARDIOVASCULAR Auscultation: regular rhythm, normal rate Murmurs: none Pulses: carotid and radial pulse 2+ palpable Lower extremity edema: none Lower extremity varicosities: none  ABDOMEN Distension: none Masses: Large firm nodular mass left upper quadrant of the abdomen extending below the level of the umbilicus on the left side. It extends up to the costal margin. It is nontender. Tenderness: none Hepatosplenomegaly: not present Hernia: No sign of hernia. Well-healed lower midline incision.  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 This mass is quite large, measuring 25 cm in greatest dimension. CT scan is reviewed and this appears to displace the liver, stomach, spleen, and pancreas but does not show any evidence of direct invasion or involvement.  I discussed surgical removal at length with the patient today. We  discussed the fact that we would not like to obtain a biopsy prior to surgery due to the risk of bleeding or causing dissemination of the tumor within the abdomen. I would like to obtain some additional laboratory studies and possibly some 24-hour urine studies to rule out other etiologies. We will arrange for the studies in the near future. Once they are completed, we will proceed with scheduling her for surgery.  We discussed performing the surgery at [facility]. Patient will require preoperative evaluation by anesthesiology. Patient will undergo the procedure with general anesthesia through an open abdominal incision in the midline. Surgery will probably last 2-3 hours. Patient should plan on being admitted to the hospital for 3-5 days following her procedure.  Patient understands the above plan and agrees to proceed. We will arrange for laboratory testing in the near future. Once those results are back, I will contact the patient and we will proceed with scheduling her surgery at a time convenient for her in the near future.  The risks and benefits of the procedure have been discussed at length with the patient. The patient understands the proposed procedure, potential alternative treatments, and the course of recovery to be expected. All of the patient's questions have been answered at this time. The patient wishes to proceed with surgery.  Armandina Gemma, [facility] Office: [phone]

## 2018-08-18 ENCOUNTER — Inpatient Hospital Stay (HOSPITAL_COMMUNITY)
Admission: RE | Admit: 2018-08-18 | Discharge: 2018-08-23 | DRG: 614 | Disposition: A | Discharge status: Home / Self Care | Payer: Medicaid Other | Attending: Surgery | Admitting: Surgery

## 2018-08-18 ENCOUNTER — Other Ambulatory Visit: Payer: Self-pay

## 2018-08-18 ENCOUNTER — Inpatient Hospital Stay (HOSPITAL_COMMUNITY): Payer: Medicaid Other

## 2018-08-18 ENCOUNTER — Inpatient Hospital Stay (HOSPITAL_COMMUNITY): Payer: Medicaid Other | Admitting: Physician Assistant

## 2018-08-18 ENCOUNTER — Encounter (HOSPITAL_COMMUNITY)
Admission: RE | Disposition: A | Discharge status: Home / Self Care | Payer: Self-pay | Source: Home / Self Care | Attending: Surgery

## 2018-08-18 ENCOUNTER — Encounter (HOSPITAL_COMMUNITY): Payer: Self-pay | Admitting: Emergency Medicine

## 2018-08-18 DIAGNOSIS — Z803 Family history of malignant neoplasm of breast: Secondary | ICD-10-CM | POA: Diagnosis not present

## 2018-08-18 DIAGNOSIS — K429 Umbilical hernia without obstruction or gangrene: Secondary | ICD-10-CM | POA: Diagnosis present

## 2018-08-18 DIAGNOSIS — E278 Other specified disorders of adrenal gland: Secondary | ICD-10-CM | POA: Diagnosis not present

## 2018-08-18 DIAGNOSIS — R1900 Intra-abdominal and pelvic swelling, mass and lump, unspecified site: Secondary | ICD-10-CM

## 2018-08-18 DIAGNOSIS — E878 Other disorders of electrolyte and fluid balance, not elsewhere classified: Secondary | ICD-10-CM | POA: Diagnosis not present

## 2018-08-18 DIAGNOSIS — E861 Hypovolemia: Secondary | ICD-10-CM | POA: Diagnosis not present

## 2018-08-18 DIAGNOSIS — D492 Neoplasm of unspecified behavior of bone, soft tissue, and skin: Secondary | ICD-10-CM | POA: Diagnosis not present

## 2018-08-18 DIAGNOSIS — E872 Acidosis: Secondary | ICD-10-CM | POA: Diagnosis not present

## 2018-08-18 DIAGNOSIS — Z8249 Family history of ischemic heart disease and other diseases of the circulatory system: Secondary | ICD-10-CM

## 2018-08-18 DIAGNOSIS — R011 Cardiac murmur, unspecified: Secondary | ICD-10-CM | POA: Diagnosis not present

## 2018-08-18 DIAGNOSIS — N179 Acute kidney failure, unspecified: Secondary | ICD-10-CM | POA: Diagnosis not present

## 2018-08-18 DIAGNOSIS — I10 Essential (primary) hypertension: Secondary | ICD-10-CM | POA: Diagnosis not present

## 2018-08-18 DIAGNOSIS — C7492 Malignant neoplasm of unspecified part of left adrenal gland: Principal | ICD-10-CM | POA: Diagnosis present

## 2018-08-18 DIAGNOSIS — Z79899 Other long term (current) drug therapy: Secondary | ICD-10-CM | POA: Diagnosis not present

## 2018-08-18 DIAGNOSIS — Z91013 Allergy to seafood: Secondary | ICD-10-CM

## 2018-08-18 DIAGNOSIS — D481 Neoplasm of uncertain behavior of connective and other soft tissue: Secondary | ICD-10-CM | POA: Diagnosis not present

## 2018-08-18 DIAGNOSIS — D573 Sickle-cell trait: Secondary | ICD-10-CM | POA: Diagnosis not present

## 2018-08-18 DIAGNOSIS — N2889 Other specified disorders of kidney and ureter: Secondary | ICD-10-CM | POA: Diagnosis present

## 2018-08-18 DIAGNOSIS — Z90710 Acquired absence of both cervix and uterus: Secondary | ICD-10-CM

## 2018-08-18 DIAGNOSIS — T8110XA Postprocedural shock unspecified, initial encounter: Secondary | ICD-10-CM | POA: Diagnosis not present

## 2018-08-18 DIAGNOSIS — E785 Hyperlipidemia, unspecified: Secondary | ICD-10-CM | POA: Diagnosis not present

## 2018-08-18 DIAGNOSIS — D62 Acute posthemorrhagic anemia: Secondary | ICD-10-CM | POA: Diagnosis not present

## 2018-08-18 DIAGNOSIS — Z882 Allergy status to sulfonamides status: Secondary | ICD-10-CM | POA: Diagnosis not present

## 2018-08-18 DIAGNOSIS — R579 Shock, unspecified: Secondary | ICD-10-CM

## 2018-08-18 HISTORY — DX: Other specified symptoms and signs involving the circulatory and respiratory systems: R09.89

## 2018-08-18 HISTORY — PX: ADRENALECTOMY: SHX876

## 2018-08-18 HISTORY — DX: Intra-abdominal and pelvic swelling, mass and lump, unspecified site: R19.00

## 2018-08-18 LAB — CBC
HCT: 22.6 % — ABNORMAL LOW (ref 36.0–46.0)
Hemoglobin: 7.3 g/dL — ABNORMAL LOW (ref 12.0–15.0)
MCH: 28.1 pg (ref 26.0–34.0)
MCHC: 32.3 g/dL (ref 30.0–36.0)
MCV: 86.9 fL (ref 80.0–100.0)
Platelets: 134 10*3/uL — ABNORMAL LOW (ref 150–400)
RBC: 2.6 MIL/uL — ABNORMAL LOW (ref 3.87–5.11)
RDW: 15.5 % (ref 11.5–15.5)
WBC: 19.1 10*3/uL — ABNORMAL HIGH (ref 4.0–10.5)
nRBC: 0 % (ref 0.0–0.2)

## 2018-08-18 LAB — POCT I-STAT 4, (NA,K, GLUC, HGB,HCT)
Glucose, Bld: 117 mg/dL — ABNORMAL HIGH (ref 70–99)
HCT: 24 % — ABNORMAL LOW (ref 36.0–46.0)
Hemoglobin: 8.2 g/dL — ABNORMAL LOW (ref 12.0–15.0)
Potassium: 3.1 mmol/L — ABNORMAL LOW (ref 3.5–5.1)
Sodium: 145 mmol/L (ref 135–145)

## 2018-08-18 LAB — APTT: aPTT: 32 seconds (ref 24–36)

## 2018-08-18 LAB — BASIC METABOLIC PANEL
Anion gap: 5 (ref 5–15)
BUN: 15 mg/dL (ref 8–23)
CO2: 24 mmol/L (ref 22–32)
Calcium: 7.1 mg/dL — ABNORMAL LOW (ref 8.9–10.3)
Chloride: 113 mmol/L — ABNORMAL HIGH (ref 98–111)
Creatinine, Ser: 0.98 mg/dL (ref 0.44–1.00)
GFR calc Af Amer: >60 mL/min (ref >60)
GFR calc non Af Amer: >60 mL/min (ref >60)
Glucose, Bld: 187 mg/dL — ABNORMAL HIGH (ref 70–99)
Potassium: 4.2 mmol/L (ref 3.5–5.1)
Sodium: 142 mmol/L (ref 135–145)

## 2018-08-18 LAB — HEMOGLOBIN AND HEMATOCRIT, BLOOD
HCT: 21.5 % — ABNORMAL LOW (ref 36.0–46.0)
Hemoglobin: 6.8 g/dL — CL (ref 12.0–15.0)

## 2018-08-18 LAB — PROTIME-INR
INR: 1.4 — ABNORMAL HIGH (ref 0.8–1.2)
Prothrombin Time: 16.6 seconds — ABNORMAL HIGH (ref 11.4–15.2)

## 2018-08-18 LAB — PREPARE RBC (CROSSMATCH)

## 2018-08-18 LAB — PROCALCITONIN: Procalcitonin: 1.71 ng/mL

## 2018-08-18 LAB — LACTIC ACID, PLASMA: Lactic Acid, Venous: 1.6 mmol/L (ref 0.5–1.9)

## 2018-08-18 LAB — MRSA PCR SCREENING: MRSA by PCR: NEGATIVE

## 2018-08-18 LAB — LACTATE DEHYDROGENASE: LDH: 115 U/L (ref 98–192)

## 2018-08-18 SURGERY — ADRENALECTOMY
Anesthesia: General | Site: Abdomen

## 2018-08-18 MED ORDER — LACTATED RINGERS IV BOLUS
1000.0000 mL | Freq: Once | INTRAVENOUS | Status: AC
  Administered 2018-08-18: 1000 mL via INTRAVENOUS

## 2018-08-18 MED ORDER — PHENYLEPHRINE HCL-NACL 10-0.9 MG/250ML-% IV SOLN
INTRAVENOUS | Status: AC
  Administered 2018-08-18: 40 ug/min via INTRAVENOUS
  Filled 2018-08-18: qty 250

## 2018-08-18 MED ORDER — DIPHENHYDRAMINE HCL 12.5 MG/5ML PO ELIX
12.5000 mg | ORAL_SOLUTION | Freq: Four times a day (QID) | ORAL | Status: DC | PRN
  Filled 2018-08-18: qty 5

## 2018-08-18 MED ORDER — OXYCODONE HCL 5 MG PO TABS: 5.0000 mg | ORAL_TABLET | Freq: Once | ORAL | Status: DC | PRN

## 2018-08-18 MED ORDER — ONDANSETRON HCL 4 MG/2ML IJ SOLN: 4.0000 mg | Freq: Four times a day (QID) | INTRAMUSCULAR | Status: DC | PRN

## 2018-08-18 MED ORDER — FENTANYL CITRATE (PF) 100 MCG/2ML IJ SOLN
INTRAMUSCULAR | Status: AC
  Filled 2018-08-18: qty 2

## 2018-08-18 MED ORDER — PROPOFOL 10 MG/ML IV BOLUS
INTRAVENOUS | Status: AC
  Filled 2018-08-18: qty 20

## 2018-08-18 MED ORDER — ONDANSETRON HCL 4 MG/2ML IJ SOLN
INTRAMUSCULAR | Status: AC
  Filled 2018-08-18: qty 2

## 2018-08-18 MED ORDER — ONDANSETRON 4 MG PO TBDP: 4.0000 mg | ORAL_TABLET | Freq: Four times a day (QID) | ORAL | Status: DC | PRN

## 2018-08-18 MED ORDER — ALBUMIN HUMAN 5 % IV SOLN
INTRAVENOUS | Status: AC
  Filled 2018-08-18: qty 250

## 2018-08-18 MED ORDER — ROCURONIUM BROMIDE 10 MG/ML (PF) SYRINGE
PREFILLED_SYRINGE | INTRAVENOUS | Status: DC | PRN
  Administered 2018-08-18: 60 mg via INTRAVENOUS
  Administered 2018-08-18: 10 mg via INTRAVENOUS

## 2018-08-18 MED ORDER — HYDROMORPHONE HCL 1 MG/ML IJ SOLN
INTRAMUSCULAR | Status: DC | PRN
  Administered 2018-08-18 (×4): 0.5 mg via INTRAVENOUS

## 2018-08-18 MED ORDER — PANTOPRAZOLE SODIUM 40 MG IV SOLR: 40.0000 mg | Freq: Every day | INTRAVENOUS | Status: DC

## 2018-08-18 MED ORDER — PHENYLEPHRINE 40 MCG/ML (10ML) SYRINGE FOR IV PUSH (FOR BLOOD PRESSURE SUPPORT)
PREFILLED_SYRINGE | INTRAVENOUS | Status: AC
  Filled 2018-08-18: qty 10

## 2018-08-18 MED ORDER — ONDANSETRON HCL 4 MG/2ML IJ SOLN: 4.0000 mg | Freq: Once | INTRAMUSCULAR | Status: DC | PRN

## 2018-08-18 MED ORDER — ALBUMIN HUMAN 5 % IV SOLN
INTRAVENOUS | Status: DC | PRN
  Administered 2018-08-18: 12:00:00 via INTRAVENOUS

## 2018-08-18 MED ORDER — FENTANYL CITRATE (PF) 100 MCG/2ML IJ SOLN
INTRAMUSCULAR | Status: DC | PRN
  Administered 2018-08-18: 100 ug via INTRAVENOUS
  Administered 2018-08-18: 50 ug via INTRAVENOUS
  Administered 2018-08-18: 100 ug via INTRAVENOUS

## 2018-08-18 MED ORDER — CEFAZOLIN SODIUM-DEXTROSE 2-4 GM/100ML-% IV SOLN
2.0000 g | INTRAVENOUS | Status: AC
  Administered 2018-08-18: 2 g via INTRAVENOUS
  Filled 2018-08-18: qty 100

## 2018-08-18 MED ORDER — SODIUM CHLORIDE 0.9 % IV SOLN
INTRAVENOUS | Status: DC | PRN
  Administered 2018-08-18: 40 ug/min via INTRAVENOUS

## 2018-08-18 MED ORDER — ONDANSETRON HCL 4 MG/2ML IJ SOLN
INTRAMUSCULAR | Status: DC | PRN
  Administered 2018-08-18: 4 mg via INTRAVENOUS

## 2018-08-18 MED ORDER — KCL IN DEXTROSE-NACL 20-5-0.45 MEQ/L-%-% IV SOLN
INTRAVENOUS | Status: DC
  Administered 2018-08-18 – 2018-08-19 (×3): via INTRAVENOUS
  Filled 2018-08-18 (×2): qty 1000

## 2018-08-18 MED ORDER — OXYCODONE HCL 5 MG/5ML PO SOLN: 5.0000 mg | Freq: Once | ORAL | Status: DC | PRN

## 2018-08-18 MED ORDER — PHENYLEPHRINE HCL-NACL 10-0.9 MG/250ML-% IV SOLN
0.0000 ug/min | INTRAVENOUS | Status: DC
  Administered 2018-08-18: 20:00:00 40 ug/min via INTRAVENOUS
  Administered 2018-08-18: 100 ug/min via INTRAVENOUS
  Administered 2018-08-19: 90 ug/min via INTRAVENOUS
  Administered 2018-08-19: 20 ug/min via INTRAVENOUS
  Administered 2018-08-19: 100 ug/min via INTRAVENOUS
  Administered 2018-08-19: 50 ug/min via INTRAVENOUS
  Filled 2018-08-18 (×6): qty 250

## 2018-08-18 MED ORDER — SODIUM CHLORIDE 0.9 % IV SOLN
30.0000 ug/min | INTRAVENOUS | Status: DC
  Administered 2018-08-18: 30 ug/min via INTRAVENOUS
  Administered 2018-08-18: 20 ug/min via INTRAVENOUS

## 2018-08-18 MED ORDER — LIDOCAINE 2% (20 MG/ML) 5 ML SYRINGE
INTRAMUSCULAR | Status: DC | PRN
  Administered 2018-08-18: 50 mg via INTRAVENOUS

## 2018-08-18 MED ORDER — HYDROMORPHONE 1 MG/ML IV SOLN
INTRAVENOUS | Status: DC
  Administered 2018-08-18: 0.9 mg via INTRAVENOUS
  Administered 2018-08-19 (×2): 0.6 mg via INTRAVENOUS
  Administered 2018-08-19: 1.2 mg via INTRAVENOUS
  Filled 2018-08-18: qty 30

## 2018-08-18 MED ORDER — DIPHENHYDRAMINE HCL 50 MG/ML IJ SOLN: 12.5000 mg | Freq: Four times a day (QID) | INTRAMUSCULAR | Status: DC | PRN

## 2018-08-18 MED ORDER — DEXAMETHASONE SODIUM PHOSPHATE 10 MG/ML IJ SOLN
INTRAMUSCULAR | Status: AC
  Filled 2018-08-18: qty 1

## 2018-08-18 MED ORDER — HYDROMORPHONE HCL 2 MG/ML IJ SOLN
INTRAMUSCULAR | Status: AC
  Filled 2018-08-18: qty 1

## 2018-08-18 MED ORDER — SODIUM CHLORIDE 0.9% FLUSH: 9.0000 mL | INTRAVENOUS | Status: DC | PRN

## 2018-08-18 MED ORDER — PHENYLEPHRINE 40 MCG/ML (10ML) SYRINGE FOR IV PUSH (FOR BLOOD PRESSURE SUPPORT)
PREFILLED_SYRINGE | INTRAVENOUS | Status: DC | PRN
  Administered 2018-08-18 (×2): 80 ug via INTRAVENOUS

## 2018-08-18 MED ORDER — FENTANYL CITRATE (PF) 100 MCG/2ML IJ SOLN
25.0000 ug | INTRAMUSCULAR | Status: DC | PRN
  Administered 2018-08-18: 50 ug via INTRAVENOUS

## 2018-08-18 MED ORDER — CHLORHEXIDINE GLUCONATE CLOTH 2 % EX PADS: 6.0000 | MEDICATED_PAD | Freq: Once | CUTANEOUS | Status: DC

## 2018-08-18 MED ORDER — LIDOCAINE 2% (20 MG/ML) 5 ML SYRINGE
INTRAMUSCULAR | Status: AC
  Filled 2018-08-18: qty 5

## 2018-08-18 MED ORDER — LACTATED RINGERS IV SOLN
INTRAVENOUS | Status: DC
  Administered 2018-08-18 (×4): via INTRAVENOUS

## 2018-08-18 MED ORDER — CHLORHEXIDINE GLUCONATE CLOTH 2 % EX PADS
6.0000 | MEDICATED_PAD | Freq: Every day | CUTANEOUS | Status: DC
  Administered 2018-08-19 – 2018-08-23 (×5): 6 via TOPICAL

## 2018-08-18 MED ORDER — FENTANYL CITRATE (PF) 250 MCG/5ML IJ SOLN
INTRAMUSCULAR | Status: AC
  Filled 2018-08-18: qty 5

## 2018-08-18 MED ORDER — MIDAZOLAM HCL 5 MG/5ML IJ SOLN
INTRAMUSCULAR | Status: DC | PRN
  Administered 2018-08-18: 2 mg via INTRAVENOUS

## 2018-08-18 MED ORDER — KCL IN DEXTROSE-NACL 20-5-0.45 MEQ/L-%-% IV SOLN
INTRAVENOUS | Status: DC
  Administered 2018-08-18: 18:00:00 via INTRAVENOUS
  Filled 2018-08-18: qty 1000

## 2018-08-18 MED ORDER — EPHEDRINE 5 MG/ML INJ
INTRAVENOUS | Status: AC
  Filled 2018-08-18: qty 10

## 2018-08-18 MED ORDER — ALBUMIN HUMAN 5 % IV SOLN
12.5000 g | Freq: Once | INTRAVENOUS | Status: AC
  Administered 2018-08-18: 12.5 g via INTRAVENOUS

## 2018-08-18 MED ORDER — 0.9 % SODIUM CHLORIDE (POUR BTL) OPTIME
TOPICAL | Status: DC | PRN
  Administered 2018-08-18: 3000 mL

## 2018-08-18 MED ORDER — PHENYLEPHRINE HCL (PRESSORS) 10 MG/ML IV SOLN
INTRAVENOUS | Status: AC
  Filled 2018-08-18: qty 2

## 2018-08-18 MED ORDER — ACETAMINOPHEN 650 MG RE SUPP: 650.0000 mg | Freq: Four times a day (QID) | RECTAL | Status: DC | PRN

## 2018-08-18 MED ORDER — PANTOPRAZOLE SODIUM 40 MG IV SOLR
40.0000 mg | Freq: Every day | INTRAVENOUS | Status: DC
  Administered 2018-08-18 – 2018-08-22 (×5): 40 mg via INTRAVENOUS
  Filled 2018-08-18 (×5): qty 40

## 2018-08-18 MED ORDER — ROCURONIUM BROMIDE 10 MG/ML (PF) SYRINGE
PREFILLED_SYRINGE | INTRAVENOUS | Status: AC
  Filled 2018-08-18: qty 10

## 2018-08-18 MED ORDER — EPHEDRINE SULFATE-NACL 50-0.9 MG/10ML-% IV SOSY
PREFILLED_SYRINGE | INTRAVENOUS | Status: DC | PRN
  Administered 2018-08-18 (×2): 5 mg via INTRAVENOUS

## 2018-08-18 MED ORDER — ACETAMINOPHEN 325 MG PO TABS
650.0000 mg | ORAL_TABLET | Freq: Four times a day (QID) | ORAL | Status: DC | PRN
  Administered 2018-08-22: 650 mg via ORAL
  Filled 2018-08-18: qty 2

## 2018-08-18 MED ORDER — PROPOFOL 10 MG/ML IV BOLUS
INTRAVENOUS | Status: DC | PRN
  Administered 2018-08-18: 170 mg via INTRAVENOUS

## 2018-08-18 MED ORDER — NALOXONE HCL 0.4 MG/ML IJ SOLN: 0.4000 mg | INTRAMUSCULAR | Status: DC | PRN

## 2018-08-18 MED ORDER — SUGAMMADEX SODIUM 500 MG/5ML IV SOLN
INTRAVENOUS | Status: AC
  Filled 2018-08-18: qty 5

## 2018-08-18 MED ORDER — MIDAZOLAM HCL 2 MG/2ML IJ SOLN
INTRAMUSCULAR | Status: AC
  Filled 2018-08-18: qty 2

## 2018-08-18 MED ORDER — DEXAMETHASONE SODIUM PHOSPHATE 10 MG/ML IJ SOLN
INTRAMUSCULAR | Status: DC | PRN
  Administered 2018-08-18: 8 mg via INTRAVENOUS

## 2018-08-18 SURGICAL SUPPLY — 63 items
APPLICATOR COTTON TIP 6 STRL (MISCELLANEOUS) ×4 IMPLANT
APPLICATOR COTTON TIP 6IN STRL (MISCELLANEOUS) ×8
BLADE EXTENDED COATED 6.5IN (ELECTRODE) ×4 IMPLANT
BLADE HEX COATED 2.75 (ELECTRODE) ×4 IMPLANT
BLADE SURG SZ10 CARB STEEL (BLADE) ×8 IMPLANT
CHLORAPREP W/TINT 26 (MISCELLANEOUS) ×4 IMPLANT
CLIP VESOCCLUDE LG 6/CT (CLIP) ×20 IMPLANT
COVER MAYO STAND STRL (DRAPES) ×4 IMPLANT
COVER SURGICAL LIGHT HANDLE (MISCELLANEOUS) ×4 IMPLANT
COVER WAND RF STERILE (DRAPES) IMPLANT
DISSECTOR ROUND CHERRY 3/8 STR (MISCELLANEOUS) ×4 IMPLANT
DRAIN CHANNEL 10F 3/8 F FF (DRAIN) IMPLANT
DRAIN CHANNEL 19F RND (DRAIN) ×4 IMPLANT
DRAPE LAPAROSCOPIC ABDOMINAL (DRAPES) ×4 IMPLANT
DRAPE SHEET LG 3/4 BI-LAMINATE (DRAPES) IMPLANT
DRAPE WARM FLUID 44X44 (DRAPES) ×4 IMPLANT
DRSG OPSITE POSTOP 4X12 (GAUZE/BANDAGES/DRESSINGS) ×4 IMPLANT
DRSG PAD ABDOMINAL 8X10 ST (GAUZE/BANDAGES/DRESSINGS) IMPLANT
ELECT REM PT RETURN 15FT ADLT (MISCELLANEOUS) ×4 IMPLANT
EVACUATOR DRAINAGE 10X20 100CC (DRAIN) IMPLANT
EVACUATOR SILICONE 100CC (DRAIN) ×4 IMPLANT
GAUZE 4X4 16PLY RFD (DISPOSABLE) ×4 IMPLANT
GAUZE SPONGE 4X4 12PLY STRL (GAUZE/BANDAGES/DRESSINGS) ×4 IMPLANT
GLOVE BIOGEL PI IND STRL 7.0 (GLOVE) ×2 IMPLANT
GLOVE BIOGEL PI INDICATOR 7.0 (GLOVE) ×2
GLOVE SURG ORTHO 8.0 STRL STRW (GLOVE) ×8 IMPLANT
GOWN STRL REUS W/TWL LRG LVL3 (GOWN DISPOSABLE) ×4 IMPLANT
GOWN STRL REUS W/TWL XL LVL3 (GOWN DISPOSABLE) ×8 IMPLANT
HANDLE SUCTION POOLE (INSTRUMENTS) ×2 IMPLANT
HEMOSTAT ARISTA ABSORB 3G PWDR (HEMOSTASIS) ×4 IMPLANT
HEMOSTAT SURGICEL 2X4 FIBR (HEMOSTASIS) ×8 IMPLANT
HEMOSTAT SURGICEL 4X8 (HEMOSTASIS) ×4 IMPLANT
KIT BASIN OR (CUSTOM PROCEDURE TRAY) ×4 IMPLANT
KIT TURNOVER KIT A (KITS) IMPLANT
MARKER SKIN DUAL TIP RULER LAB (MISCELLANEOUS) ×4 IMPLANT
NS IRRIG 1000ML POUR BTL (IV SOLUTION) ×8 IMPLANT
PACK GENERAL/GYN (CUSTOM PROCEDURE TRAY) ×4 IMPLANT
PAD TELFA 2X3 NADH STRL (GAUZE/BANDAGES/DRESSINGS) IMPLANT
SHEARS FOC LG CVD HARMONIC 17C (MISCELLANEOUS) ×4 IMPLANT
SHEARS HARMONIC ACE PLUS 36CM (ENDOMECHANICALS) IMPLANT
SPONGE DRAIN TRACH 4X4 STRL 2S (GAUZE/BANDAGES/DRESSINGS) ×4 IMPLANT
SPONGE LAP 18X18 RF (DISPOSABLE) ×20 IMPLANT
STAPLER VISISTAT 35W (STAPLE) ×4 IMPLANT
SUCTION POOLE HANDLE (INSTRUMENTS) ×4
SUT NOVA NAB DX-16 0-1 5-0 T12 (SUTURE) ×20 IMPLANT
SUT PDS AB 1 CTX 36 (SUTURE) IMPLANT
SUT SILK 2 0 (SUTURE) ×12
SUT SILK 2 0 SH CR/8 (SUTURE) ×8 IMPLANT
SUT SILK 2 0SH CR/8 30 (SUTURE) ×8 IMPLANT
SUT SILK 2-0 18XBRD TIE 12 (SUTURE) ×6 IMPLANT
SUT SILK 2-0 30XBRD TIE 12 (SUTURE) ×6 IMPLANT
SUT SILK 3 0 (SUTURE) ×2
SUT SILK 3 0 SH CR/8 (SUTURE) IMPLANT
SUT SILK 3-0 18XBRD TIE 12 (SUTURE) ×2 IMPLANT
SUT VIC AB 2-0 SH 18 (SUTURE) IMPLANT
SUT VIC AB 3-0 SH 18 (SUTURE) IMPLANT
SUT VICRYL 2 0 18  UND BR (SUTURE)
SUT VICRYL 2 0 18 UND BR (SUTURE) IMPLANT
TOWEL OR 17X26 10 PK STRL BLUE (TOWEL DISPOSABLE) ×4 IMPLANT
TOWEL OR NON WOVEN STRL DISP B (DISPOSABLE) ×4 IMPLANT
TRAY FOLEY MTR SLVR 14FR STAT (SET/KITS/TRAYS/PACK) ×4 IMPLANT
TRAY FOLEY MTR SLVR 16FR STAT (SET/KITS/TRAYS/PACK) ×4 IMPLANT
YANKAUER SUCT BULB TIP NO VENT (SUCTIONS) ×4 IMPLANT

## 2018-08-18 NOTE — Anesthesia Procedure Notes (Signed)
Procedure Name: Intubation Date/Time: 08/18/2018 10:38 AM Performed by: Anne Fu, CRNA Pre-anesthesia Checklist: Patient identified, Emergency Drugs available, Suction available, Patient being monitored and Timeout performed Patient Re-evaluated:Patient Re-evaluated prior to induction Oxygen Delivery Method: Circle system utilized Preoxygenation: Pre-oxygenation with 100% oxygen Induction Type: IV induction Ventilation: Mask ventilation without difficulty Laryngoscope Size: Mac and 4 Grade View: Grade I Tube type: Oral Tube size: 7.5 mm Number of attempts: 1 Airway Equipment and Method: Stylet Placement Confirmation: ETT inserted through vocal cords under direct vision,  positive ETCO2 and breath sounds checked- equal and bilateral Secured at: 19 cm Tube secured with: Tape Dental Injury: Teeth and Oropharynx as per pre-operative assessment

## 2018-08-18 NOTE — Consult Note (Signed)
NAME:  Kaylee Nelson, MRN:  [id number], DOB:  [birth year], LOS: 0 ADMISSION DATE:  08/18/2018, CONSULTATION DATE: 08/18/2018 REFERRING MD: Dr. Harlow Nelson, CHIEF COMPLAINT: Shock  Brief History   63 year old woman with hypertension and SS trait, admitted to the ICU with hypotension following exploratory laparotomy and resection of left retroperitoneal mass.   History of present illness   63 year old woman with a history of hypertension, sickle cell trait, admitted for elective exploratory laparotomy and resection of a left retroperitoneal mass.  It was felt to be adrenal in origin, 25.7 x 16.1 x 14.3 cm.  She had a biochemical evaluation that ruled out pheochromocytoma as an outpatient.  Postoperatively she developed hypotension requiring initiation of volume resuscitation, albumin x2.  Phenylephrine was initiated initially 20 and uptitrated to 35.  Hemoglobin dropped from 8.2-6.8 postoperatively and 2 units of blood have been ordered, one has been partially infused.  Her left upper quadrant drain has put out 100 cc of sanguinous fluid.  She received fentanyl x1, no other sedating medication since the surgery in the PACU.  She did get her carvedilol today preoperatively.  Past Medical History   Past Medical History:  Diagnosis Date  . Heart murmur    as a child  . Hypertension   . Phlegm in throat    LAST WEEK OR TWO NO FEVER  . Sickle cell trait (Kaylee Nelson)      Significant Hospital Events   Admitted to ICU with hypotension 7/16  Consults:    Procedures:  08/18/2018 --exploratory laparotomy, surgical resection of left retroperitoneal mass presumed adrenal  Significant Diagnostic Tests:  MRI abdomen 06/07/2018 >> 3100 cm left upper quadrant mass associated with the left adrenal and displacing the stomach, kidney, spleen, heterogeneously enhancing.  Micro Data:    Antimicrobials:  Ancef peri-op 7/16  Interim history/subjective:  Patient has remained on 20-35 mcg phenylephrine in the  PACU Note drop in hemoglobin, PRBC ordered and infusing  Objective   Blood pressure (!) 104/59, pulse 67, temperature (!) 97.4 F (36.3 C), temperature source Oral, resp. rate (!) 4, height 5\' 8"  (1.727 m), weight 88.6 kg, SpO2 99 %.        Intake/Output Summary (Last 24 hours) at 08/18/2018 1613 Last data filed at 08/18/2018 1545 Gross per 24 hour  Intake 3200 ml  Output 1550 ml  Net 1650 ml   Filed Weights   08/18/18 0812  Weight: 88.6 kg    Examination: General: Tired, ill-appearing but in no distress HENT: Oropharynx clear.  Pupils equal round and reactive Lungs: Shallow breaths but clear, no crackles, no wheeze Cardiovascular: Regular, distant, no murmur Abdomen: Diffusely sore to palpation, no rebound, no guarding, left upper quadrant JP drain in place with bloody drainage, no bowel sounds heard Extremities: No edema Neuro: Sleepy but wakes easily to voice, follows commands, answers questions GU: Foley catheter  Resolved Hospital Problem list     Assessment & Plan:  Postoperative shock, multifactorial: Acute surgical blood loss, sedating medications/anesthesia, hypovolemia, possible residual effects of her carvedilol.  Nothing currently to suggest acute infection, sepsis. -Agree with phenylephrine for goal MAP > 65.  Wean as able.  IV access appears to be adequate. -Agree with PRBC, 2 units ordered, first unit infusing. -Follow posttransfusion CBC for stability -Given her history of sickle cell trait I will send an LDH and haptoglobin, though I suspect that her anemia can be explained by her expected surgical blood loss. -Gentle IV fluids -Check lactic acid -Consider procalcitonin if evidence of infection,  fever, etc. -Defer empiric antibiotics for now -Attempt to minimize sedating medications.  May need to transition away from Dilaudid PCA if she remains pressor dependent after transfusion -Hold home antihypertensives for now  Retroperitoneal mass, presumed adrenal  -Postop care, drain management as per Dr Kaylee Nelson plans -Follow pathology results  Hx HTN -Home antihypertensive regimen held  Hyperlipidemia -Statin on hold   Best practice:  Diet: N.p.o. Pain/Anxiety/Delirium protocol (if indicated): Dilaudid ordered as needed VAP protocol (if indicated): N/A DVT prophylaxis: SCD GI prophylaxis: Protonix Glucose control: NA Mobility: Bedrest Code Status: Full code Family Communication:  Disposition: ICU  Labs   CBC: Recent Labs  Lab 08/18/18 1213 08/18/18 1434  HGB 8.2* 6.8*  HCT 24.0* 21.5*    Basic Metabolic Panel: Recent Labs  Lab 08/18/18 1213  NA 145  K 3.1*  GLUCOSE 117*   GFR: Estimated Creatinine Clearance: 84.9 mL/min (by C-G formula based on SCr of 0.59 mg/dL). No results for input(s): PROCALCITON, WBC, LATICACIDVEN in the last 168 hours.  Liver Function Tests: No results for input(s): AST, ALT, ALKPHOS, BILITOT, PROT, ALBUMIN in the last 168 hours. No results for input(s): LIPASE, AMYLASE in the last 168 hours. No results for input(s): AMMONIA in the last 168 hours.  ABG No results found for: PHART, PCO2ART, PO2ART, HCO3, TCO2, ACIDBASEDEF, O2SAT   Coagulation Profile: Recent Labs  Lab 08/18/18 1546  INR 1.4*    Cardiac Enzymes: No results for input(s): CKTOTAL, CKMB, CKMBINDEX, TROPONINI in the last 168 hours.  HbA1C: No results found for: HGBA1C  CBG: No results for input(s): GLUCAP in the last 168 hours.  Review of Systems:   Complains of being sleepy, thirsty and some diffuse abdominal pain  Past Medical History  She,  has a past medical history of Heart murmur, Hypertension, Phlegm in throat, and Sickle cell trait (Kaylee Nelson).   Surgical History    Past Surgical History:  Procedure Laterality Date  . ABDOMINAL HYSTERECTOMY    . CESAREAN SECTION       Social History   reports that she has never smoked. She has never used smokeless tobacco. She reports that she does not drink alcohol or use  drugs.   Family History   Her family history includes Cancer in her daughter, maternal aunt, and mother.   Allergies Allergies  Allergen Reactions  . Shrimp [Shellfish Allergy]   . Sulfa Antibiotics      Home Medications  Prior to Admission medications   Medication Sig Start Date End Date Taking? Authorizing Provider  carvedilol (COREG) 25 MG tablet Take 1 tablet (25 mg total) by mouth 2 (two) times daily with a meal. 04/20/18  Yes Newlin, Enobong, MD  losartan (COZAAR) 100 MG tablet Take 1 tablet (100 mg total) by mouth daily. 04/20/18  Yes Charlott Rakes, MD  naproxen (NAPROSYN) 500 MG tablet TAKE 1 TABLET BY MOUTH TWICE DAILY WITH A MEAL Patient taking differently: Take 500 mg by mouth 2 (two) times daily with a meal.  07/29/18  Yes Newlin, Enobong, MD  Potassium Chloride ER 20 MEQ TBCR Take 1 tablet by mouth once daily Patient taking differently: Take 20 mEq by mouth daily. Take 1 tablet by mouth once daily 04/20/18  Yes Newlin, Enobong, MD  atorvastatin (LIPITOR) 20 MG tablet Take 1 tablet (20 mg total) by mouth daily. Patient not taking: Reported on 08/16/2018 04/29/18   Charlott Rakes, MD     Critical care time: 47 minutes     Baltazar Apo, MD, PhD 08/18/2018,  4:37 PM [facility] [phone] or if no answer [phone]

## 2018-08-18 NOTE — Transfer of Care (Signed)
Immediate Anesthesia Transfer of Care Note  Patient: Kaylee Nelson  Procedure(s) Performed: Procedure(s): OPEN LEFT ADRENALECTOMY (N/A)  Patient Location: PACU  Anesthesia Type:General  Level of Consciousness:  sedated, patient cooperative and responds to stimulation  Airway & Oxygen Therapy:Patient Spontanous Breathing and Patient connected to face mask oxgen  Post-op Assessment:  Report given to PACU RN and Post -op Vital signs reviewed and stable  Post vital signs:  Reviewed and stable  Last Vitals:  Vitals:   08/18/18 0736 08/18/18 1341  BP: (!) 180/88   Pulse: 72 (P) 70  Resp: 16   Temp: 36.8 C (P) 36.7 C  SpO2: 95% (P) 453%    Complications: No apparent anesthesia complications

## 2018-08-18 NOTE — Interval H&P Note (Signed)
History and Physical Interval Note:  08/18/2018 9:02 AM  Kaylee Nelson  has presented today for surgery, with the diagnosis of LEFT ADRENAL MASS.  The various methods of treatment have been discussed with the patient and family. After consideration of risks, benefits and other options for treatment, the patient has consented to    Procedure(s): OPEN LEFT ADRENALECTOMY (Left) POSSIBLE SPLENECTOMY AND POSSIBLE LEFT NEPHRECTOMY (N/A) as a surgical intervention.    The patient's history has been reviewed, patient examined, no change in status, stable for surgery.  I have reviewed the patient's chart and labs.  Questions were answered to the patient's satisfaction.    Armandina Gemma, [facility] Office: [place]

## 2018-08-18 NOTE — Anesthesia Postprocedure Evaluation (Signed)
Anesthesia Post Note  Patient: Kaylee Nelson  Procedure(s) Performed: OPEN LEFT ADRENALECTOMY (N/A Abdomen)     Patient location during evaluation: PACU Anesthesia Type: General Level of consciousness: sedated and responds to stimulation Pain management: pain level controlled Vital Signs Assessment: post-procedure vital signs reviewed and stable Respiratory status: spontaneous breathing, nonlabored ventilation, respiratory function stable and patient connected to nasal cannula oxygen Cardiovascular status: stable Postop Assessment: no apparent nausea or vomiting Anesthetic complications: no Comments: Pt requiring low dose of phenylephrine to maintain systolic blood pressure >11. Postop Hgb 6.8 - 1 unit of blood started in PACU and 2nd unit ordered by surgeon to follow. Pt is sedated but awakens to voice. Transferring to ICU in stable condition. Report given to ICU MD.    Last Vitals:  Vitals:   08/18/18 1555 08/18/18 1600  BP: (!) 78/56 (!) 104/59  Pulse: 69 67  Resp: 12 (!) 4  Temp:  (!) 36.3 C  SpO2: 98% 99%    Last Pain:  Vitals:   08/18/18 1600  TempSrc: Oral  PainSc: [place]

## 2018-08-18 NOTE — Anesthesia Procedure Notes (Signed)
Arterial Line Insertion Start/End7/16/2020 10:40 AM, 08/18/2018 10:45 AM Performed by: Lidia Collum, MD  Patient location: Pre-op. Preanesthetic checklist: patient identified, IV checked, site marked, risks and benefits discussed, surgical consent, monitors and equipment checked, pre-op evaluation, timeout performed and anesthesia consent Lidocaine 1% used for infiltration radial was placed Catheter size: 20 Fr Hand hygiene performed  and maximum sterile barriers used   Attempts: 1 Procedure performed without using ultrasound guided technique. Following insertion, dressing applied. Post procedure assessment: normal and unchanged

## 2018-08-18 NOTE — Op Note (Signed)
Operative Note  Pre-operative Diagnosis:  Left adrenal / retroperitoneal mass  Post-operative Diagnosis:  same  Surgeon:  Armandina Gemma, MD  Assistant:  Autumn Messing, MD   Procedure:  Exploratory laparotomy, resection of left retroperitoneal mass (>30 cm diameter) (presumed to be adrenal in origin)  Anesthesia:  general  Estimated Blood Loss:  1450 cc  Drains: 19Fr Blake drain LUQ         Specimen: to pathology, Dr. Natoshia Laws  Indications:  Patient is referred by Dr. Otis Brace for surgical evaluation and management of left adrenal mass. Patient's primary care physician is Dr. Charlott Rakes. Patient initially presented to her primary care physician in February 2020 with pain at the level of the umbilicus. She was referred to gastroenterology. She was having problems with constipation. On physical examination she was noted to have a mass. She underwent a CT scan of the abdomen and pelvis on April 28, 2018. This demonstrated a heterogeneous mass arising from the left retroperitoneum measuring 25.7 x 16.1 x 14.3 cm. 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 This mass displaces and abuts the upper left kidney, pancreatic tail, and spleen. There was no evidence of metastatic disease in the abdomen. Additional work-up included biochemical evaluation to rule out pheochromocytoma.  Patient also underwent an MRI scan of the abdomen.  Radiographically, the mass appears to originate from the left adrenal gland.  There does not appear to be any direct invasion of the surrounding organs.  Patient now comes to surgery for resection.  Procedure Details:  The patient was seen in the pre-op holding area. The risks, benefits, complications, treatment options, and expected outcomes were previously discussed with the patient. The patient agreed with the proposed plan and has signed the informed consent form.  The patient was brought to the operating room by the surgical team, identified as  [name] and the procedure verified. A "time out" was completed and the above information confirmed.  Following induction of general endotracheal anesthesia, the patient is positioned and then prepped and draped in the usual aseptic fashion.  After ascertaining that an adequate level of anesthesia had been achieved, midline abdominal incision was made with a #10 blade.  Dissection was carried through subcutaneous tissues.  Fascia is incised in the midline and the peritoneal cavity is entered cautiously.  Incision is extended from the xiphoid to the low abdomen in the midline.  There is a small umbilical hernia sac present which is excised.  Mass involves the left side of the abdomen particularly the left upper quadrant.  Spleen is displaced anteriorly.  Stomach and pancreas are displaced superiorly and medially.  Colon is displaced inferiorly and medially.  The retroperitoneum was opened above and lateral to the splenic flexure of the colon exposing the mass.  Dissection is performed using the harmonic scalpel for hemostasis.  With gentle dissection, the mass is mobilized.  There are large venous collaterals to the mass which are ligated in continuity with 2-0 silk ties and divided with the harmonic scalpel.  Dissection is carried inferiorly initially.  The kidney is identified inferior to the mass in the left lower quadrant of the abdomen.  It does not appear to be directly invaded by the mass.  It is gently separated and venous structures are divided between ligaclips with the harmonic scalpel.  Renal artery is palpable and avoided during the dissection.  Next the medial aspect of the mass is dissected out.  Mass is retracted laterally.  Venous tributaries are divided between  2-0 silk ties with the harmonic scalpel.  Additional venous tributaries are divided between medium ligaclips with the harmonic scalpel.  The mass is gradually mobilized.  Medial and superiorly the mass is mobilized off of the  greater curvature of the stomach and the inferior aspect of the pancreas.  Care is taken with dissection in this area using the harmonic scalpel to proceed along the capsule of the mass.  Again the mass does not appear to directly involve either the stomach or the pancreas.  Splenic vessels are identified.  Mass was then mobilized posteriorly with gentle blunt dissection.  Larger vessels are divided between right angle clamps and ligated with 2-0 silk ties.  Dissection is carried into the left upper quadrant from which the mass is separated from the diaphragm with gentle blunt dissection.  At no point did we feel the capsule of the mass was violated during the dissection.  At no point did we think the mass directly invaded any surrounding structures.  Remaining attachments are divided superiorly and posteriorly and the entire mass is delivered from the abdomen.  Abdomen is packed with dry sponges.  Mass is photographed and then submitted to pathology for review by Dr. Glenice Laws.  This included gross examination as well as frozen section examination.  A definite origin of the tumor could not be made based on these studies.  Additional sections will be taken.  Abdomen is irrigated with warm saline.  Hemostasis is achieved with application of large ligaclips in the deep left upper quadrant followed by Surgicel and fibrillar lar with packing.  Good hemostasis is achieved.  A 19 [place] Blake drain is brought in through the left upper quadrant stab wound and placed into the left upper quadrant at the site of the tumor.  Drain is secured to the skin with a 3-0 nylon suture.  Drain is placed to bulb suction.  On exiting the abdomen a small superficial tear was noted on the anterior lateral surface of the spleen.  This is not bleeding.  A piece of fibrillar is placed along this part of the capsule and left in place.  Omentum was used to cover the viscera.  Midline incision is closed with interrupted #1 Novafil  simple sutures.  Subcutaneous tissues are irrigated.  Skin is closed with stainless steel staples.  Honeycomb dressing is applied.  Patient is awakened from anesthesia and transported to the recovery room in stable condition.  The patient tolerated the procedure well.   Armandina Gemma, MD [facility] Office: [phone]

## 2018-08-18 NOTE — Anesthesia Preprocedure Evaluation (Signed)
Anesthesia Evaluation  Patient identified by MRN, date of birth, ID band Patient awake    Reviewed: Allergy & Precautions, NPO status , Patient's Chart, lab work & pertinent test results, reviewed documented beta blocker date and time   History of Anesthesia Complications Negative for: history of anesthetic complications  Airway Mallampati: I  TM Distance: >3 FB Neck ROM: Full    Dental  (+) Teeth Intact   Pulmonary neg pulmonary ROS,    Pulmonary exam normal        Cardiovascular hypertension, Pt. on medications and Pt. on home beta blockers Normal cardiovascular exam     Neuro/Psych negative neurological ROS  negative psych ROS   GI/Hepatic negative GI ROS, Neg liver ROS,   Endo/Other  negative endocrine ROS  Renal/GU negative Renal ROS  negative genitourinary   Musculoskeletal negative musculoskeletal ROS (+)   Abdominal   Peds  Hematology negative hematology ROS (+)   Anesthesia Other Findings Very large adrenal tumor. Not metabolically active.   Reproductive/Obstetrics                            Anesthesia Physical Anesthesia Plan  ASA: II  Anesthesia Plan: General   Post-op Pain Management:    Induction: Intravenous and Rapid sequence  PONV Risk Score and Plan: 3 and Ondansetron, Dexamethasone, Midazolam and Treatment may vary due to age or medical condition  Airway Management Planned: Oral ETT  Additional Equipment: None  Intra-op Plan:   Post-operative Plan: Extubation in OR  Informed Consent: I have reviewed the patients History and Physical, chart, labs and discussed the procedure including the risks, benefits and alternatives for the proposed anesthesia with the patient or authorized representative who has indicated his/her understanding and acceptance.     Dental advisory given  Plan Discussed with:   Anesthesia Plan Comments:         Anesthesia Quick  Evaluation

## 2018-08-18 NOTE — Progress Notes (Signed)
7/16 @ 1900 - In to assess patient, patient stated that she had abd surgical site pain 10/10 with tears in eyes, pt's BP in the 161W systolic, pt had order for PCA, connected PCA and instructed patient how to use and that only she should push the button.  At [facility], Neo gtt ran dry, order for Neo had been d/c'd.  At 1945 BP 79/44, pt instructed not to push button and briefly removed d/t low BP, E-link notified of situation.  At 1952 Dr. Jimmy Footman reordered Neo.  At 2002 Neo restarted at 75mcg which was what it was going at prior to the bag running dry.  Titrated up on the Neo and slowly BP up in the 96E systolic.  Jacqulyn Ducking ICU/SD RN4 / Care Coordinator / Rapid Response Nurse Rapid Response Number:  [id number] ICU Charge Nurse Number:  [phone]

## 2018-08-19 ENCOUNTER — Encounter (HOSPITAL_COMMUNITY): Payer: Self-pay | Admitting: Surgery

## 2018-08-19 DIAGNOSIS — R579 Shock, unspecified: Secondary | ICD-10-CM

## 2018-08-19 LAB — CBC WITH DIFFERENTIAL/PLATELET
Abs Immature Granulocytes: 0.22 10*3/uL — ABNORMAL HIGH (ref 0.00–0.07)
Basophils Absolute: 0 10*3/uL (ref 0.0–0.1)
Basophils Relative: 0 %
Eosinophils Absolute: 0 10*3/uL (ref 0.0–0.5)
Eosinophils Relative: 0 %
HCT: 19.5 % — ABNORMAL LOW (ref 36.0–46.0)
Hemoglobin: 6.2 g/dL — CL (ref 12.0–15.0)
Immature Granulocytes: 2 %
Lymphocytes Relative: 10 %
Lymphs Abs: 1.4 10*3/uL (ref 0.7–4.0)
MCH: 27.7 pg (ref 26.0–34.0)
MCHC: 31.8 g/dL (ref 30.0–36.0)
MCV: 87.1 fL (ref 80.0–100.0)
Monocytes Absolute: 1.8 10*3/uL — ABNORMAL HIGH (ref 0.1–1.0)
Monocytes Relative: 13 %
Neutro Abs: 10.3 10*3/uL — ABNORMAL HIGH (ref 1.7–7.7)
Neutrophils Relative %: 75 %
Platelets: 143 10*3/uL — ABNORMAL LOW (ref 150–400)
RBC: 2.24 MIL/uL — ABNORMAL LOW (ref 3.87–5.11)
RDW: 16.3 % — ABNORMAL HIGH (ref 11.5–15.5)
WBC: 13.7 10*3/uL — ABNORMAL HIGH (ref 4.0–10.5)
nRBC: 0 % (ref 0.0–0.2)

## 2018-08-19 LAB — CBC
HCT: 21.6 % — ABNORMAL LOW (ref 36.0–46.0)
Hemoglobin: 7.1 g/dL — ABNORMAL LOW (ref 12.0–15.0)
MCH: 28.5 pg (ref 26.0–34.0)
MCHC: 32.9 g/dL (ref 30.0–36.0)
MCV: 86.7 fL (ref 80.0–100.0)
Platelets: 147 10*3/uL — ABNORMAL LOW (ref 150–400)
RBC: 2.49 MIL/uL — ABNORMAL LOW (ref 3.87–5.11)
RDW: 15.9 % — ABNORMAL HIGH (ref 11.5–15.5)
WBC: 19 10*3/uL — ABNORMAL HIGH (ref 4.0–10.5)
nRBC: 0 % (ref 0.0–0.2)

## 2018-08-19 LAB — BASIC METABOLIC PANEL
Anion gap: 7 (ref 5–15)
BUN: 18 mg/dL (ref 8–23)
CO2: 20 mmol/L — ABNORMAL LOW (ref 22–32)
Calcium: 7 mg/dL — ABNORMAL LOW (ref 8.9–10.3)
Chloride: 116 mmol/L — ABNORMAL HIGH (ref 98–111)
Creatinine, Ser: 1.59 mg/dL — ABNORMAL HIGH (ref 0.44–1.00)
GFR calc Af Amer: 40 mL/min — ABNORMAL LOW (ref >60)
GFR calc non Af Amer: 34 mL/min — ABNORMAL LOW (ref >60)
Glucose, Bld: 175 mg/dL — ABNORMAL HIGH (ref 70–99)
Potassium: 4.7 mmol/L (ref 3.5–5.1)
Sodium: 143 mmol/L (ref 135–145)

## 2018-08-19 LAB — MAGNESIUM: Magnesium: 1.4 mg/dL — ABNORMAL LOW (ref 1.7–2.4)

## 2018-08-19 LAB — PROCALCITONIN: Procalcitonin: 2.99 ng/mL

## 2018-08-19 LAB — HIV ANTIBODY (ROUTINE TESTING W REFLEX): HIV Screen 4th Generation wRfx: NONREACTIVE

## 2018-08-19 LAB — PREPARE RBC (CROSSMATCH)

## 2018-08-19 LAB — LACTIC ACID, PLASMA: Lactic Acid, Venous: 2.3 mmol/L (ref 0.5–1.9)

## 2018-08-19 LAB — CORTISOL: Cortisol, Plasma: 3.6 ug/dL

## 2018-08-19 MED ORDER — DIPHENHYDRAMINE HCL 50 MG/ML IJ SOLN: 12.5000 mg | Freq: Four times a day (QID) | INTRAMUSCULAR | Status: DC | PRN

## 2018-08-19 MED ORDER — ONDANSETRON HCL 4 MG/2ML IJ SOLN: 4.0000 mg | Freq: Four times a day (QID) | INTRAMUSCULAR | Status: DC | PRN

## 2018-08-19 MED ORDER — HYDROMORPHONE 1 MG/ML IV SOLN
INTRAVENOUS | Status: DC
  Administered 2018-08-19: 0.8 mg via INTRAVENOUS
  Administered 2018-08-19 – 2018-08-20 (×3): 0.6 mg via INTRAVENOUS
  Administered 2018-08-20: 0.8 mg via INTRAVENOUS
  Administered 2018-08-20: 0.2 mg via INTRAVENOUS
  Administered 2018-08-20: 0.8 mg via INTRAVENOUS
  Administered 2018-08-20 – 2018-08-21 (×2): 0.6 mg via INTRAVENOUS
  Administered 2018-08-21: 1.2 mg via INTRAVENOUS
  Administered 2018-08-21: 0.6 mg via INTRAVENOUS
  Administered 2018-08-21: 0.4 mg via INTRAVENOUS
  Administered 2018-08-22: 0.2 mg via INTRAVENOUS

## 2018-08-19 MED ORDER — LACTATED RINGERS IV SOLN
INTRAVENOUS | Status: DC
  Administered 2018-08-19 – 2018-08-23 (×4): via INTRAVENOUS

## 2018-08-19 MED ORDER — SODIUM CHLORIDE 0.9% IV SOLUTION
Freq: Once | INTRAVENOUS | Status: AC
  Administered 2018-08-19: 18:00:00 via INTRAVENOUS

## 2018-08-19 MED ORDER — NALOXONE HCL 0.4 MG/ML IJ SOLN: 0.4000 mg | INTRAMUSCULAR | Status: DC | PRN

## 2018-08-19 MED ORDER — DIPHENHYDRAMINE HCL 12.5 MG/5ML PO ELIX: 12.5000 mg | ORAL_SOLUTION | Freq: Four times a day (QID) | ORAL | Status: DC | PRN

## 2018-08-19 MED ORDER — MAGNESIUM SULFATE 4 GM/100ML IV SOLN: 4.0000 g | Freq: Once | INTRAVENOUS | Status: DC

## 2018-08-19 MED ORDER — MAGNESIUM SULFATE 4 GM/100ML IV SOLN
4.0000 g | Freq: Once | INTRAVENOUS | Status: AC
  Administered 2018-08-19: 4 g via INTRAVENOUS
  Filled 2018-08-19: qty 100

## 2018-08-19 MED ORDER — ORAL CARE MOUTH RINSE
15.0000 mL | Freq: Two times a day (BID) | OROMUCOSAL | Status: DC
  Administered 2018-08-19 – 2018-08-23 (×8): 15 mL via OROMUCOSAL

## 2018-08-19 MED ORDER — SODIUM CHLORIDE 0.9% FLUSH: 9.0000 mL | INTRAVENOUS | Status: DC | PRN

## 2018-08-19 NOTE — Progress Notes (Signed)
Assessment & Plan: POD#1 - status post resection left upper quadrant retroperitoneal mass, presumed adrenal in origin  Post op hypotension, acute blood loss anemia  Admitted to ICU, CCM consult appreciated  Transfused 2U PRBC - Hgb 7.1 this AM  Phenylephrine for BP support - improved this AM after volume  Allow clear liquids this AM  OOB to chair if tolerated and BP holds  Monitor Hgb closely - no apparent active bleeding based on drain output        Armandina Gemma, MD       [facility]       Office: [phone]   Chief Complaint: LUQ retroperitoneal mass, presumed adrenal in origin  Subjective: Patient in ICU bed, awake, alert.  Pain controlled with PCA.  No complaints this AM.  Objective: Vital signs in last 24 hours: Temp:  [97.4 F (36.3 C)-98.1 F (36.7 C)] 98.1 F (36.7 C) (07/17 0317) Pulse Rate:  [65-84] 84 (07/16 1957) Resp:  [4-24] 11 (07/17 0800) BP: (66-129)/(43-87) 83/44 (07/17 0800) SpO2:  [97 %-100 %] 98 % (07/17 0825) Arterial Line BP: (69-128)/(38-58) 98/50 (07/17 0800) FiO2 (%):  [99 %] 99 % (07/16 2034) Last BM Date: 08/17/18  Intake/Output from previous day: 07/16 0701 - 07/17 0700 In: 7150.9 [I.V.:5786.5; Blood:864.5; IV Piggyback:500] Out: 1940 [Urine:350; Drains:140; Blood:1450] Intake/Output this shift: Total I/O In: 154.9 [I.V.:154.9] Out: 20 [Urine:20]  Physical Exam: HEENT - sclerae clear, mucous membranes moist Neck - soft Chest - clear bilaterally Cor - RRR Abdomen - soft but more distended than immediately post op; drain with small sanguinous output; midline wound dry and intact; BS present Ext - no edema, non-tender Neuro - alert & oriented, no focal deficits  Lab Results:  Recent Labs    08/18/18 2208 08/19/18 0520  WBC 19.1* 19.0*  HGB 7.3* 7.1*  HCT 22.6* 21.6*  PLT 134* 147*   BMET Recent Labs    08/18/18 2208 08/19/18 0520  NA 142 143  K 4.2 4.7  CL 113* 116*  CO2 24 20*  GLUCOSE 187*  175*  BUN 15 18  CREATININE 0.98 1.59*  CALCIUM 7.1* 7.0*   PT/INR Recent Labs    08/18/18 1546  LABPROT 16.6*  INR 1.4*   Comprehensive Metabolic Panel:    Component Value Date/Time   NA 143 08/19/2018 0520   NA 142 08/18/2018 2208   NA 147 (H) 04/21/2018 1117   NA 146 (H) 11/13/2016 0953   K 4.7 08/19/2018 0520   K 4.2 08/18/2018 2208   CL 116 (H) 08/19/2018 0520   CL 113 (H) 08/18/2018 2208   CO2 20 (L) 08/19/2018 0520   CO2 24 08/18/2018 2208   BUN 18 08/19/2018 0520   BUN 15 08/18/2018 2208   BUN 6 (L) 04/21/2018 1117   BUN 7 (L) 11/13/2016 0953   CREATININE 1.59 (H) 08/19/2018 0520   CREATININE 0.98 08/18/2018 2208   CREATININE 0.70 03/27/2016 1514   CREATININE 0.65 12/17/2015 1035   GLUCOSE 175 (H) 08/19/2018 0520   GLUCOSE 187 (H) 08/18/2018 2208   CALCIUM 7.0 (L) 08/19/2018 0520   CALCIUM 7.1 (L) 08/18/2018 2208   AST 13 04/21/2018 1117   AST 15 09/23/2016 1557   ALT 13 04/21/2018 1117   ALT 10 09/23/2016 1557   ALKPHOS 96 04/21/2018 1117   ALKPHOS 90 09/23/2016 1557   BILITOT 0.4 04/21/2018 1117   BILITOT 0.4 09/23/2016 1557   PROT 6.1 04/21/2018 1117   PROT 6.4  09/23/2016 1557   ALBUMIN 3.6 (L) 04/21/2018 1117   ALBUMIN 4.1 09/23/2016 1557    Studies/Results: No results found.    Armandina Gemma 08/19/2018  Patient ID: Kaylee Nelson, female   DOB: 1955-03-10, 63 y.o.   MRN: [id number]

## 2018-08-19 NOTE — Progress Notes (Addendum)
CRITICAL VALUE ALERT  Critical Value:  Hgb 6.2   Date & Time Notied:  08/19/2018 1526   Provider Notified: CCM MD Byrum   Orders Received/Actions taken: 2 units PRBC ordered. Will notify CCS MD Gerkin of results and orders received by CCM MD.   [id number] Spoke with MD Gerkin regarding Hbg 6.2, agreed with 2 units of PRBCs, will continue to monitor. No changes to abdominal assessment. No active signs of bleeding, minimal output noted at JP drain.

## 2018-08-19 NOTE — Progress Notes (Signed)
Total urine output since 0700 is 70 mL. Total intake this shift is 1280 mL. Previous daily intake approximately 7 L.    Bladder scan volume showed 71 mL   Contacted CCS MD, Gerkin   Ok to remove Foley at this time, no significant changes to abdomen/abdominal dressing or pain level. Will await patient to void after Foley removal.

## 2018-08-19 NOTE — Progress Notes (Signed)
F/u with CCM NP after d/c of PCA by P. Kary Kos, discontinued in error, PCA pump continued during clarification, new dosage modified to reduced dose PCA, changed settings and updated in [facility] and pump. 2nd RN Zoe Suggs verified dose change.

## 2018-08-19 NOTE — Progress Notes (Signed)
NAME:  Kaylee Nelson, MRN:  [id number], DOB:  03-24-1955, LOS: 1 ADMISSION DATE:  08/18/2018, CONSULTATION DATE: 08/18/2018 REFERRING MD: Dr. Harlow Asa, CHIEF COMPLAINT: Shock  Brief History   63 year old woman with hypertension and SS trait, admitted to the ICU with hypotension following exploratory laparotomy and resection of left retroperitoneal mass.   Past Medical History:  Diagnosis Date  . Heart murmur    as a child  . Hypertension   . Phlegm in throat    LAST WEEK OR TWO NO FEVER  . Sickle cell trait ([place])      Significant Hospital Events   Admitted to ICU with hypotension 7/16 7/17 mack on low dose neo overnight. Really didn't have much expected bump from blood day prior as would expect; went from 6.8 to 7.1 after 2 units.   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  Micro Data:    Antimicrobials:  Ancef peri-op 7/16  Interim history/subjective:  She remains borderline hypotensive requiring intermittent vasoactive drips still has discomfort, but is unclear how much analgesia is contributing to her hypotension  Objective   Blood pressure (Abnormal) 83/44, pulse 84, temperature 98.7 F (37.1 C), temperature source Oral, resp. rate 11, height 5\' 8"  (1.727 m), weight 88.6 kg, SpO2 98 %.    FiO2 (%):  [99 %] 99 %   Intake/Output Summary (Last 24 hours) at 08/19/2018 1001 Last data filed at 08/19/2018 0800 Gross per 24 hour  Intake 7305.82 ml  Output 1960 ml  Net 5345.82 ml   Filed Weights   08/18/18 0812  Weight: 88.6 kg    Examination: General: This is a very pleasant 63 year old black female who is resting comfortably in bed she is in no acute distress this a.m. HEENT:  Normocephalic atraumatic no jugular venous distention mucous membranes are moist pupils nonicteric no scleral edema Pulmonary: Clear to auscultation without accessory use diminished in the bases Cardiac: Regular rate and rhythm without murmur rub or gallop Abdomen: Positive bowel sounds midabdominal dressing is intact, left JP abdominal drain with some bloody output but minimal GU: Concentrated urine Neuro: Awake oriented no focal deficits appreciated. Extremities: Warm and dry brisk capillary refill no edema.  Resolved Hospital Problem list     Assessment & Plan:   Postoperative shock, multifactorial: Acute surgical blood loss, sedating medications/anesthesia, hypovolemia.  Nothing currently to suggest acute infection, sepsis; mildly elevated PCT elevated which is difficult to interpret in setting of post-op state.   Plan  Holding antihypertensives Continuing maintenance IV fluids Check cortisol with next lab draw Neo-Synephrine for mean arterial pressure greater than 65 May need repeat transfusion  Acute blood loss anemia.  History of sickle cell trait S/p 2 units PRBC. No sig rise in Hgb even after this. ? Element of hemodilution?  Plan Hold any anticoagulation  Repeat CBC adding differential again this afternoon; will transfuse for Hgb < 7 Repeat LDH and haptoglobin again in the a.m.  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 Pain/Anxiety/Delirium protocol (if indicated): Dilaudid ordered as needed VAP protocol (if indicated): N/A DVT prophylaxis: SCD GI prophylaxis: Protonix Glucose  control: NA Mobility: Bedrest Code Status: Full code Family Communication:  Disposition: ICU Remains hypotensive.  Intermittently requiring vasoactive drips.  Unclear how much of this is from narcotics versus ongoing anemia/volume depletion.  We will continue maintenance IV fluids changing to lactated Ringer's.  Continue Neo-Synephrine as needed.  Repeat blood count later today in case she needs another transfusion  Critical care time: 32 minutes   Erick Colace ACNP-BC [place] Pager # [phone] OR # [phone] if no answer

## 2018-08-20 LAB — CBC WITH DIFFERENTIAL/PLATELET
Abs Immature Granulocytes: 0.11 10*3/uL — ABNORMAL HIGH (ref 0.00–0.07)
Basophils Absolute: 0 10*3/uL (ref 0.0–0.1)
Basophils Relative: 0 %
Eosinophils Absolute: 0 10*3/uL (ref 0.0–0.5)
Eosinophils Relative: 0 %
HCT: 23.5 % — ABNORMAL LOW (ref 36.0–46.0)
Hemoglobin: 7.8 g/dL — ABNORMAL LOW (ref 12.0–15.0)
Immature Granulocytes: 1 %
Lymphocytes Relative: 9 %
Lymphs Abs: 1 10*3/uL (ref 0.7–4.0)
MCH: 28.8 pg (ref 26.0–34.0)
MCHC: 33.2 g/dL (ref 30.0–36.0)
MCV: 86.7 fL (ref 80.0–100.0)
Monocytes Absolute: 1.2 10*3/uL — ABNORMAL HIGH (ref 0.1–1.0)
Monocytes Relative: 11 %
Neutro Abs: 8.6 10*3/uL — ABNORMAL HIGH (ref 1.7–7.7)
Neutrophils Relative %: 79 %
Platelets: 105 10*3/uL — ABNORMAL LOW (ref 150–400)
RBC: 2.71 MIL/uL — ABNORMAL LOW (ref 3.87–5.11)
RDW: 15.7 % — ABNORMAL HIGH (ref 11.5–15.5)
WBC: 10.9 10*3/uL — ABNORMAL HIGH (ref 4.0–10.5)
nRBC: 0 % (ref 0.0–0.2)

## 2018-08-20 LAB — LACTATE DEHYDROGENASE: LDH: 207 U/L — ABNORMAL HIGH (ref 98–192)

## 2018-08-20 LAB — COMPREHENSIVE METABOLIC PANEL
ALT: 23 U/L (ref 0–44)
AST: 40 U/L (ref 15–41)
Albumin: 2.7 g/dL — ABNORMAL LOW (ref 3.5–5.0)
Alkaline Phosphatase: 47 U/L (ref 38–126)
Anion gap: 8 (ref 5–15)
BUN: 28 mg/dL — ABNORMAL HIGH (ref 8–23)
CO2: 22 mmol/L (ref 22–32)
Calcium: 7.4 mg/dL — ABNORMAL LOW (ref 8.9–10.3)
Chloride: 110 mmol/L (ref 98–111)
Creatinine, Ser: 1.67 mg/dL — ABNORMAL HIGH (ref 0.44–1.00)
GFR calc Af Amer: 38 mL/min — ABNORMAL LOW (ref >60)
GFR calc non Af Amer: 32 mL/min — ABNORMAL LOW (ref >60)
Glucose, Bld: 109 mg/dL — ABNORMAL HIGH (ref 70–99)
Potassium: 4 mmol/L (ref 3.5–5.1)
Sodium: 140 mmol/L (ref 135–145)
Total Bilirubin: 0.5 mg/dL (ref 0.3–1.2)
Total Protein: 4.9 g/dL — ABNORMAL LOW (ref 6.5–8.1)

## 2018-08-20 LAB — HAPTOGLOBIN: Haptoglobin: 36 mg/dL — ABNORMAL LOW (ref 37–355)

## 2018-08-20 LAB — PROCALCITONIN: Procalcitonin: 1.35 ng/mL

## 2018-08-20 LAB — HEMOGLOBIN AND HEMATOCRIT, BLOOD
HCT: 29.9 % — ABNORMAL LOW (ref 36.0–46.0)
Hemoglobin: 10 g/dL — ABNORMAL LOW (ref 12.0–15.0)

## 2018-08-20 LAB — MAGNESIUM: Magnesium: 2.3 mg/dL (ref 1.7–2.4)

## 2018-08-20 LAB — PREPARE RBC (CROSSMATCH)

## 2018-08-20 MED ORDER — LABETALOL HCL 5 MG/ML IV SOLN
10.0000 mg | INTRAVENOUS | Status: DC | PRN
  Administered 2018-08-21 – 2018-08-22 (×5): 10 mg via INTRAVENOUS
  Filled 2018-08-20 (×6): qty 4

## 2018-08-20 MED ORDER — SODIUM CHLORIDE 0.9% IV SOLUTION
Freq: Once | INTRAVENOUS | Status: AC
  Administered 2018-08-20: 14:00:00 via INTRAVENOUS

## 2018-08-20 MED ORDER — METOPROLOL TARTRATE 5 MG/5ML IV SOLN
5.0000 mg | Freq: Three times a day (TID) | INTRAVENOUS | Status: DC
  Administered 2018-08-20 – 2018-08-23 (×12): 5 mg via INTRAVENOUS
  Filled 2018-08-20 (×12): qty 5

## 2018-08-20 NOTE — Progress Notes (Signed)
2 Days Post-Op   Subjective/Chief Complaint: Complains of pain and feeling cold. BP better this am   Objective: Vital signs in last 24 hours: Temp:  [97.9 F (36.6 C)-98.9 F (37.2 C)] 98.6 F (37 C) (07/18 0800) Pulse Rate:  [85-120] 85 (07/18 0400) Resp:  [10-25] 23 (07/18 0600) BP: (80-177)/(45-126) 158/105 (07/18 0600) SpO2:  [89 %-98 %] 95 % (07/18 0426) Arterial Line BP: (81-194)/(43-83) 194/83 (07/18 0000) FiO2 (%):  [0 %] 0 % (07/18 0426) Last BM Date: 08/17/18  Intake/Output from previous day: 07/17 0701 - 07/18 0700 In: 3958.9 [P.O.:470; I.V.:2619.1; Blood:769.8; IV Piggyback:100] Out: 797.5 [Urine:780; Drains:17.5] Intake/Output this shift: No intake/output data recorded.  General appearance: alert and cooperative Resp: clear to auscultation bilaterally Cardio: regular rate and rhythm GI: soft, distended. incision ok. few bs  Lab Results:  Recent Labs    08/19/18 0520 08/19/18 1428  WBC 19.0* 13.7*  HGB 7.1* 6.2*  HCT 21.6* 19.5*  PLT 147* 143*   BMET Recent Labs    08/18/18 2208 08/19/18 0520  NA 142 143  K 4.2 4.7  CL 113* 116*  CO2 24 20*  GLUCOSE 187* 175*  BUN 15 18  CREATININE 0.98 1.59*  CALCIUM 7.1* 7.0*   PT/INR Recent Labs    08/18/18 1546  LABPROT 16.6*  INR 1.4*   ABG No results for input(s): PHART, HCO3 in the last 72 hours.  Invalid input(s): PCO2, PO2  Studies/Results: No results found.  Anti-infectives: Anti-infectives (From admission, onward)   Start     Dose/Rate Route Frequency Ordered Stop   08/18/18 0745  ceFAZolin (ANCEF) IVPB 2g/100 mL premix     2 g 200 mL/hr over 30 Minutes Intravenous On call to O.R. 08/18/18 0732 08/18/18 1042      Assessment/Plan: s/p Procedure(s): OPEN LEFT ADRENALECTOMY (N/A) Hg low although not much blood coming out of drain. recheck hg this am. if low she may need more blood  I suspect she is also developing ileus. Hold on diet for now Continue to monitor in icu  LOS: 2  days    Kaylee Nelson 08/20/2018

## 2018-08-20 NOTE — Progress Notes (Signed)
NAME:  Kaylee Nelson, MRN:  [id number], DOB:  Jun 27, 1955, LOS: 2 ADMISSION DATE:  08/18/2018, CONSULTATION DATE: 08/18/2018 REFERRING MD: Dr. Harlow Asa, CHIEF COMPLAINT: Shock  Brief History   63 year old woman with hypertension and SS trait, admitted to the ICU with hypotension following exploratory laparotomy and resection of left retroperitoneal mass.   Past Medical History:  Diagnosis Date  . Heart murmur    as a child  . Hypertension   . Phlegm in throat    LAST WEEK OR TWO NO FEVER  . Sickle cell trait ([place])      Significant Hospital Events   Admitted to ICU with hypotension 7/16 7/17 mack on low dose neo overnight. Really didn't have much expected bump from blood day prior as would expect; went from 6.8 to 7.1 after 2 units.   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  Micro Data:    Antimicrobials:  Ancef peri-op 7/16  Interim history/subjective:  Her pressors have been weaned to off and she is up to chair.  Feels somewhat better but not very interested in taking p.o. Again her hemoglobin did not rebound as I would expect after 2 units PRBC  Objective   Blood pressure (!) 147/70, pulse 85, temperature 98.9 F (37.2 C), temperature source Oral, resp. rate 14, height 5\' 8"  (1.727 m), weight 88.6 kg, SpO2 94 %.    FiO2 (%):  [0 %] 0 %   Intake/Output Summary (Last 24 hours) at 08/20/2018 1304 Last data filed at 08/20/2018 1154 Gross per 24 hour  Intake 2935.48 ml  Output 1157.5 ml  Net 1777.98 ml   Filed Weights   08/18/18 0812  Weight: 88.6 kg    Examination: General: Pleasant woman, comfortable, sitting up in a chair HEENT: Oropharynx clear, pupils equal Pulmonary: Slightly  diminished both bases but clear Cardiac: Regular, no murmur Abdomen: Positive bowel sounds, JP drain in place with minimal output, diffuse mild tenderness GU: Concentrated urine Neuro: Awake, oriented, nonfocal Extremities: No edema  Resolved Hospital Problem list     Assessment & Plan:   Postoperative shock, multifactorial, resolved: Acute surgical blood loss, sedating medications/anesthesia, hypovolemia.  Nothing currently to suggest acute infection, sepsis   Plan  Pressors weaned to off Continue to hold home antihypertensive regimen until blood pressure increases Random cortisol 3.6, done in the afternoon, unrevealing  Acute blood loss anemia.  History of sickle cell trait Again she did not seem to rebound as I would have expected after 2 units PRBC on 7/17.  Question persistent blood loss retroperitoneum Plan Continue to hold any anticoagulation Follow CBC, transfuse Hgb < 7.  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 Plan Pathology still pending Drain in place, being managed by CCS  AKI 2/2 shock  S Cr appears to be plateauing, 1.67 on 7/18 Plan Continue LR 50 cc/h for another day, likely DC on 7/19 Strict I&O Avoid nephro toxins  Fluid and electrolyte imbalance: hyperchloremia, mild NAG metabolic acidosis, resolved Plan LR 50 cc/h  Hyperlipidemia Plan Statin on hold   Best practice:  Diet: clears Pain/Anxiety/Delirium protocol (if indicated): Dilaudid ordered as needed VAP protocol (if indicated): N/A DVT prophylaxis: SCD GI prophylaxis: Protonix Glucose control: NA Mobility: Bedrest  Code Status: Full code Family Communication:  Disposition: should be able to transition to SDU status  Critical care time: N/a    PCCM will sign off.  Please call us if we can help further  Baltazar Apo, MD, PhD 08/20/2018, 1:11 PM [facility] [phone] or if no answer  [phone]

## 2018-08-21 LAB — TYPE AND SCREEN
ABO/RH(D): O POS
Antibody Screen: NEGATIVE
Unit division: 0
Unit division: 0
Unit division: 0
Unit division: 0
Unit division: 0
Unit division: 0

## 2018-08-21 LAB — BPAM RBC
Blood Product Expiration Date: 202008152359
Blood Product Expiration Date: 202008152359
Blood Product Expiration Date: 202008162359
Blood Product Expiration Date: 202008162359
Blood Product Expiration Date: 202008172359
Blood Product Expiration Date: 202008172359
ISSUE DATE / TIME: 202007161534
ISSUE DATE / TIME: 202007161653
ISSUE DATE / TIME: 202007171719
ISSUE DATE / TIME: 202007172048
ISSUE DATE / TIME: 202007181323
ISSUE DATE / TIME: 202007181606
Unit Type and Rh: 5100
Unit Type and Rh: 5100
Unit Type and Rh: 5100
Unit Type and Rh: 5100
Unit Type and Rh: 5100
Unit Type and Rh: 5100

## 2018-08-21 LAB — HAPTOGLOBIN: Haptoglobin: 131 mg/dL (ref 37–355)

## 2018-08-21 MED ORDER — HYDRALAZINE HCL 20 MG/ML IJ SOLN
10.0000 mg | INTRAMUSCULAR | Status: DC | PRN
  Administered 2018-08-21 – 2018-08-23 (×8): 10 mg via INTRAVENOUS
  Filled 2018-08-21 (×8): qty 1

## 2018-08-21 NOTE — Progress Notes (Signed)
[facility] Progress Note Patient Name: Kaylee Nelson DOB: 1955-11-16 MRN: [id number]   Date of Service  08/21/2018  HPI/Events of Note  Hypertension - BP = 186/75   eICU Interventions  Will order: 1. Hydralazine 10 mg IV Q 4 hours PRN SBP > 170 or SBP > 100.  2. PCCM has signed off. Please call primary service for further management.      Intervention Category Major Interventions: Hypertension - evaluation and management  Gaylin Bulthuis [place] 08/21/2018, 3:39 AM

## 2018-08-21 NOTE — Progress Notes (Signed)
3 Days Post-Op   Subjective/Chief Complaint: No complaints. Feels a little better this am   Objective: Vital signs in last 24 hours: Temp:  [98.7 F (37.1 C)-100.2 F (37.9 C)] 99.5 F (37.5 C) (07/19 0722) Pulse Rate:  [67-90] 83 (07/19 0722) Resp:  [11-25] 21 (07/19 0730) BP: (114-198)/(54-95) 198/87 (07/19 0722) SpO2:  [90 %-100 %] 96 % (07/19 0730) Last BM Date: 08/17/18  Intake/Output from previous day: 07/18 0701 - 07/19 0700 In: 1825.2 [I.V.:1195.2; Blood:630] Out: 1910 [Urine:1900; Drains:10] Intake/Output this shift: Total I/O In: -  Out: 800 [Urine:800]  General appearance: alert and cooperative Resp: clear to auscultation bilaterally Cardio: regular rate and rhythm GI: soft and seems less distended. drain output serosanguinous  Lab Results:  Recent Labs    08/19/18 1428 08/20/18 0755 08/20/18 2118  WBC 13.7* 10.9*  --   HGB 6.2* 7.8* 10.0*  HCT 19.5* 23.5* 29.9*  PLT 143* 105*  --    BMET Recent Labs    08/19/18 0520 08/20/18 0755  NA 143 140  K 4.7 4.0  CL 116* 110  CO2 20* 22  GLUCOSE 175* 109*  BUN 18 28*  CREATININE 1.59* 1.67*  CALCIUM 7.0* 7.4*   PT/INR Recent Labs    08/18/18 1546  LABPROT 16.6*  INR 1.4*   ABG No results for input(s): PHART, HCO3 in the last 72 hours.  Invalid input(s): PCO2, PO2  Studies/Results: No results found.  Anti-infectives: Anti-infectives (From admission, onward)   Start     Dose/Rate Route Frequency Ordered Stop   08/18/18 0745  ceFAZolin (ANCEF) IVPB 2g/100 mL premix     2 g 200 mL/hr over 30 Minutes Intravenous On call to O.R. 08/18/18 0732 08/18/18 1042      Assessment/Plan: s/p Procedure(s): OPEN LEFT ADRENALECTOMY (N/A) Hg stable after transfusion. does not seem to have active bleeding  Stay on clears until reliable bowel function returns Ambulate AKI stable Monitor in ICU one more day  LOS: 3 days    Kaylee Nelson 08/21/2018

## 2018-08-22 MED ORDER — HYDROCODONE-ACETAMINOPHEN 5-325 MG PO TABS
1.0000 | ORAL_TABLET | ORAL | Status: DC | PRN
  Administered 2018-08-22 – 2018-08-23 (×4): 1 via ORAL
  Filled 2018-08-22 (×4): qty 1

## 2018-08-22 NOTE — Progress Notes (Signed)
Assessment & Plan: POD#4 - status post resection left upper quadrant retroperitoneal mass, presumed adrenal in origin             Post op hypotension, acute blood loss anemia             Admitted to ICU, CCM consult appreciated - now stable x 24 hours             Transfused 4U PRBC total - Hgb now stable             Advance to full liquid diet today  OOB, ambulate in halls  Discontinue PCA - begin po pain Rx  Transfer to med-surg floor today        Armandina Gemma, MD       [facility]       Office: [phone]   Chief Complaint: Left adrenal mass  Subjective: Patient up in chair, tolerating liquids, passing flatus, ambulated  Objective: Vital signs in last 24 hours: Temp:  [98 F (36.7 C)-99.4 F (37.4 C)] 98 F (36.7 C) (07/20 1158) Pulse Rate:  [72-106] 100 (07/20 0900) Resp:  [13-24] 14 (07/20 1139) BP: (151-201)/(61-99) 169/89 (07/20 1100) SpO2:  [97 %-100 %] 99 % (07/20 1139) Last BM Date: 08/17/18  Intake/Output from previous day: 07/19 0701 - 07/20 0700 In: 1286.9 [I.V.:1286.9] Out: 1910 [Urine:1900; Drains:10] Intake/Output this shift: Total I/O In: -  Out: 450 [Urine:450]  Physical Exam: HEENT - sclerae clear, mucous membranes moist Neck - soft Chest - clear bilaterally Cor - RRR Abdomen - soft, mild distension; dressing dry and intact; small dark serosanguinous in JP; BS present Ext - no edema, non-tender Neuro - alert & oriented, no focal deficits  Lab Results:  Recent Labs    08/19/18 1428 08/20/18 0755 08/20/18 2118  WBC 13.7* 10.9*  --   HGB 6.2* 7.8* 10.0*  HCT 19.5* 23.5* 29.9*  PLT 143* 105*  --    BMET Recent Labs    08/20/18 0755  NA 140  K 4.0  CL 110  CO2 22  GLUCOSE 109*  BUN 28*  CREATININE 1.67*  CALCIUM 7.4*   PT/INR No results for input(s): LABPROT, INR in the last 72 hours. Comprehensive Metabolic Panel:    Component Value Date/Time   NA 140 08/20/2018 0755   NA 143 08/19/2018 0520   NA 147 (H) 04/21/2018 1117   NA 146 (H) 11/13/2016 0953   K 4.0 08/20/2018 0755   K 4.7 08/19/2018 0520   CL 110 08/20/2018 0755   CL 116 (H) 08/19/2018 0520   CO2 22 08/20/2018 0755   CO2 20 (L) 08/19/2018 0520   BUN 28 (H) 08/20/2018 0755   BUN 18 08/19/2018 0520   BUN 6 (L) 04/21/2018 1117   BUN 7 (L) 11/13/2016 0953   CREATININE 1.67 (H) 08/20/2018 0755   CREATININE 1.59 (H) 08/19/2018 0520   CREATININE 0.70 03/27/2016 1514   CREATININE 0.65 12/17/2015 1035   GLUCOSE 109 (H) 08/20/2018 0755   GLUCOSE 175 (H) 08/19/2018 0520   CALCIUM 7.4 (L) 08/20/2018 0755   CALCIUM 7.0 (L) 08/19/2018 0520   AST 40 08/20/2018 0755   AST 13 04/21/2018 1117   ALT 23 08/20/2018 0755   ALT 13 04/21/2018 1117   ALKPHOS 47 08/20/2018 0755   ALKPHOS 96 04/21/2018 1117   BILITOT 0.5 08/20/2018 0755   BILITOT 0.4 04/21/2018 1117   BILITOT 0.4 09/23/2016 1557   PROT 4.9 (L) 08/20/2018 8850  PROT 6.1 04/21/2018 1117   PROT 6.4 09/23/2016 1557   ALBUMIN 2.7 (L) 08/20/2018 0755   ALBUMIN 3.6 (L) 04/21/2018 1117   ALBUMIN 4.1 09/23/2016 1557    Studies/Results: No results found.    Armandina Gemma 08/22/2018  Patient ID: [name], female   DOB: October 28, 1955, 63 y.o.   MRN: [id number]

## 2018-08-22 NOTE — Progress Notes (Signed)
Diluadid PCA has been discontinued. Witnessed waste of 14mg  in [employer] by [employer].

## 2018-08-23 ENCOUNTER — Other Ambulatory Visit: Payer: Self-pay | Admitting: Surgery

## 2018-08-23 LAB — CBC
HCT: 32 % — ABNORMAL LOW (ref 36.0–46.0)
Hemoglobin: 10.3 g/dL — ABNORMAL LOW (ref 12.0–15.0)
MCH: 28.7 pg (ref 26.0–34.0)
MCHC: 32.2 g/dL (ref 30.0–36.0)
MCV: 89.1 fL (ref 80.0–100.0)
Platelets: 157 10*3/uL (ref 150–400)
RBC: 3.59 MIL/uL — ABNORMAL LOW (ref 3.87–5.11)
RDW: 15.1 % (ref 11.5–15.5)
WBC: 9.7 10*3/uL (ref 4.0–10.5)
nRBC: 0 % (ref 0.0–0.2)

## 2018-08-23 MED ORDER — HYDROCODONE-ACETAMINOPHEN 5-325 MG PO TABS: 1.0000 | ORAL_TABLET | ORAL | 0 refills | Status: DC | PRN

## 2018-08-23 NOTE — Discharge Summary (Signed)
Physician Discharge Summary [facility]  Patient ID: Cienna Dumais MRN: [id number] DOB/AGE: 1955/07/26 63 y.o.  Admit date: 08/18/2018 Discharge date: 08/23/2018  Admission Diagnoses:  Left adrenal mass / retroperitoneal neoplasm  Discharge Diagnoses:  Principal Problem:   Left adrenal mass [facility]) Active Problems:   Retroperitoneal mass   Shock [facility])   Discharged Condition: good  Hospital Course: Patient admitted after resection of presumed left adrenal mass.  Significant operative blood loss with post op hypotension.  Admitted to ICU with consultation from CCM.  Required pressor support intially.  Tranfused a total of 4U PRBC.  Stabilized.  Began clear liquid diet.  Gradual improvement with advancement of diet and increased activity.  Transferred to floor.  Prepared for discharge on POD#5.  Final pathology pending at the time of discharge.  Consults: pulmonary/intensive care  Treatments: surgery: resection of left adrenal / retroperitoneal mass  Discharge Exam: Blood pressure (!) 159/73, pulse 82, temperature 98.3 F (36.8 C), temperature source Oral, resp. rate 20, height 5\' 8"  (1.727 m), weight 88.6 kg, SpO2 99 %. See today's progress note.  Disposition: Home  Discharge Instructions    Change dressing (specify)   Complete by: As directed    Change dressing at drain site as needed.  May shower.   Diet - low sodium heart healthy   Complete by: As directed    Discharge instructions   Complete by: As directed    [facility]  OPEN ABDOMINAL SURGERY: POST OP INSTRUCTIONS  Always review your discharge instruction sheet given to you by the facility where your surgery was performed.  A prescription for pain medication may be given to you upon discharge.  Take your pain medication as prescribed.  If narcotic pain medicine is not needed, then you may take acetaminophen (Tylenol) or ibuprofen (Advil) as needed. Take your usually  prescribed medications unless otherwise directed. If you need a refill on your pain medication, please contact your pharmacy. They will contact our office to request authorization.  Prescriptions will not be filled after 5 pm or on weekends. You should follow a light diet the first few days after arrival home, such as soup and crackers, unless your doctor has advised otherwise. A high-fiber, low fat diet can be resumed as tolerated.  Be sure to include plenty of fluids daily.  Most patients will experience some swelling and bruising in the area of the incision. Ice packs will help. Swelling and bruising can take several days to resolve. It is common to experience some constipation if taking pain medication after surgery.  Increasing fluid intake and taking a stool softener will usually help or prevent this problem from occurring.  A mild laxative (Milk of Magnesia or Miralax) should be taken according to package directions if there are no bowel movements after 48 hours.  You may have steri-strips (small skin tapes) in place directly over the incision.  These strips should be left on the skin for 5-7 days.  Any sutures or staples will be removed at the office during your follow-up visit. You may find that a light gauze bandage over your incision may keep your staples from being rubbed or pulled. You may shower and replace the bandage daily. ACTIVITIES:  You may resume regular (light) daily activities beginning the next day - such as daily self-care, walking, climbing stairs - gradually increasing activities as tolerated.  You may have sexual intercourse when it is comfortable.  Refrain from any heavy lifting or  straining until approved by your doctor.  You may drive when you no longer are taking prescription pain medication, you can comfortably wear a seatbelt, and you can safely maneuver your car and apply brakes. You should see your doctor in the office for a follow-up appointment approximately 2-3 weeks  after your surgery.  Make sure that you call for this appointment within a day or two after you arrive home to insure a convenient appointment time.  WHEN TO CALL YOUR DOCTOR: Fever greater than 101.0 Inability to urinate Persistent nausea and/or vomiting Extreme swelling or bruising Continued bleeding from incision Increased pain, redness, or drainage from the incision Difficulty swallowing or breathing Muscle cramping or spasms Numbness or tingling in hands or around lips  IF YOU HAVE DISABILITY OR FAMILY LEAVE FORMS, YOU MUST BRING THEM TO THE OFFICE FOR PROCESSING.  PLEASE DO NOT GIVE THEM TO YOUR DOCTOR.  The clinic staff is available to answer your questions during regular business hours.  Please don't hesitate to call and ask to speak to one of the nurses if you have concerns.  [facility] [place] Office: [phone]  For further questions, please visit [email or web address]   Increase activity slowly   Complete by: As directed      Allergies as of 08/23/2018      Reactions   Shrimp [shellfish Allergy]    Sulfa Antibiotics       Medication List    TAKE these medications   atorvastatin 20 MG tablet Commonly known as: LIPITOR Take 1 tablet (20 mg total) by mouth daily.   carvedilol 25 MG tablet Commonly known as: COREG Take 1 tablet (25 mg total) by mouth 2 (two) times daily with a meal.   HYDROcodone-acetaminophen 5-325 MG tablet Commonly known as: NORCO/VICODIN Take 1-2 tablets by mouth every 4 (four) hours as needed for moderate pain.   losartan 100 MG tablet Commonly known as: COZAAR Take 1 tablet (100 mg total) by mouth daily.   naproxen 500 MG tablet Commonly known as: NAPROSYN TAKE 1 TABLET BY MOUTH TWICE DAILY WITH A MEAL What changed: See the new instructions.   Potassium Chloride ER 20 MEQ Tbcr Take 1 tablet by mouth once daily What changed:   how much to take  how to take this  when to take this             Discharge Care Instructions  (From admission, onward)         Start     Ordered   08/23/18 0000  Change dressing (specify)    Comments: Change dressing at drain site as needed.  May shower.   08/23/18 1511         Follow-up Information    Armandina Gemma, MD. Schedule an appointment as soon as possible for a visit in 3 day(s).   Specialty: General Surgery Contact information: [address] [place] [address] [phone]           Earnstine Regal, MD, [facility] Office: [phone]   Signed: Armandina Gemma 08/23/2018, 3:11 PM

## 2018-08-23 NOTE — Progress Notes (Signed)
Honeycomb dressing removed. Staples intact. No reddness, edema or drainage noted. Education provided to patient regarding drain care. Log sheet given to patient to record amount of drainage from JP after she gets home; instructed her to bring log to doctor visit. Educated patient regarding drain site care and given supplies. Donne Hazel, RN

## 2018-08-23 NOTE — Progress Notes (Signed)
Discharge and medication instructions reviewed with patient. Questions answered and patient denies further questions. One prescription for Norco given. Significant other is en route to drive patient home. Donne Hazel, RN

## 2018-08-23 NOTE — Progress Notes (Signed)
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       Office: [phone]   Chief Complaint: Left adrenal mass / retroperitoneal tumor  Subjective: Patient in chair, smiling, comfortable.  Tolerating full liquids.  Having BM's, voiding.  Wants to go home today.  Objective: Vital signs in last 24 hours: Temp:  [98.2 F (36.8 C)-100.4 F (38 C)] 98.6 F (37 C) (07/21 0541) Pulse Rate:  [88-96] 88 (07/21 0632) Resp:  [16-20] 20 (07/21 0541) BP: (170-187)/(70-95) 172/70 (07/21 0632) SpO2:  [95 %-98 %] 98 % (07/21 0541) Last BM Date: 08/22/18  Intake/Output from previous day: 07/20 0701 - 07/21 0700 In: 759.9 [P.O.:150; I.V.:599.9] Out: 8295 [Urine:3100; Drains:5] Intake/Output this shift: Total I/O In: 440 [P.O.:240; I.V.:200] Out: 1600 [Urine:1600]  Physical Exam: HEENT - sclerae clear, mucous membranes moist Neck - soft Chest - clear bilaterally Cor - RRR Abdomen - soft, protuberant; BS present; wound dry and intact; dark serosanguinous in JP drain Ext - no edema, non-tender Neuro - alert & oriented, no focal deficits  Lab Results:  Recent Labs    08/20/18 2118  HGB 10.0*  HCT 29.9*   BMET No results for input(s): NA, K, CL, CO2, GLUCOSE, BUN, CREATININE, CALCIUM in the last 72 hours. PT/INR No results for input(s): LABPROT, INR in the last 72 hours. Comprehensive Metabolic Panel:    Component Value Date/Time   NA 140 08/20/2018 0755   NA 143 08/19/2018 0520   NA 147 (H) 04/21/2018 1117   NA 146 (H) 11/13/2016 0953   K 4.0 08/20/2018 0755   K 4.7 08/19/2018 0520   CL 110 08/20/2018 0755   CL 116 (H) 08/19/2018 0520   CO2 22 08/20/2018 0755   CO2 20 (L) 08/19/2018 0520   BUN 28 (H) 08/20/2018 0755   BUN 18 08/19/2018 0520   BUN 6 (L) 04/21/2018 1117   BUN 7 (L) 11/13/2016 0953   CREATININE 1.67 (H) 08/20/2018 0755   CREATININE 1.59 (H) 08/19/2018 0520   CREATININE 0.70 03/27/2016 1514   CREATININE 0.65 12/17/2015 1035   GLUCOSE 109 (H) 08/20/2018 0755   GLUCOSE 175 (H) 08/19/2018 0520   CALCIUM 7.4 (L) 08/20/2018 0755   CALCIUM 7.0 (L) 08/19/2018 0520   AST 40 08/20/2018 0755   AST 13 04/21/2018 1117   ALT 23 08/20/2018 0755   ALT 13 04/21/2018 1117   ALKPHOS 47 08/20/2018 0755   ALKPHOS 96 04/21/2018 1117   BILITOT 0.5 08/20/2018 0755   BILITOT 0.4 04/21/2018 1117   BILITOT 0.4 09/23/2016 1557   PROT 4.9 (L) 08/20/2018 0755   PROT 6.1 04/21/2018 1117   PROT 6.4 09/23/2016 1557   ALBUMIN 2.7 (L) 08/20/2018 0755   ALBUMIN 3.6 (L) 04/21/2018 1117   ALBUMIN 4.1 09/23/2016 1557    Studies/Results: No results found.    Armandina Gemma 08/23/2018  Patient ID: Kaylee Nelson, female   DOB: [birth year], 63 y.o.   MRN:  [id number]  

## 2018-09-02 ENCOUNTER — Other Ambulatory Visit: Payer: Self-pay | Admitting: Family Medicine

## 2018-09-02 DIAGNOSIS — M79605 Pain in left leg: Secondary | ICD-10-CM

## 2018-09-06 ENCOUNTER — Encounter (HOSPITAL_COMMUNITY): Payer: Self-pay

## 2018-10-08 ENCOUNTER — Other Ambulatory Visit: Payer: Self-pay | Admitting: Family Medicine

## 2018-10-08 DIAGNOSIS — M79605 Pain in left leg: Secondary | ICD-10-CM

## 2018-11-11 ENCOUNTER — Other Ambulatory Visit: Payer: Self-pay | Admitting: Family Medicine

## 2018-11-24 DIAGNOSIS — R911 Solitary pulmonary nodule: Secondary | ICD-10-CM | POA: Diagnosis not present

## 2018-11-24 DIAGNOSIS — C499 Malignant neoplasm of connective and soft tissue, unspecified: Secondary | ICD-10-CM | POA: Diagnosis not present

## 2018-11-28 ENCOUNTER — Other Ambulatory Visit: Payer: Self-pay | Admitting: Family Medicine

## 2018-11-28 ENCOUNTER — Telehealth: Payer: Self-pay | Admitting: Family Medicine

## 2018-11-28 ENCOUNTER — Ambulatory Visit: Payer: Medicaid Other | Admitting: Family Medicine

## 2018-11-28 DIAGNOSIS — M79605 Pain in left leg: Secondary | ICD-10-CM

## 2018-11-28 DIAGNOSIS — I10 Essential (primary) hypertension: Secondary | ICD-10-CM

## 2018-11-28 DIAGNOSIS — E876 Hypokalemia: Secondary | ICD-10-CM

## 2018-11-28 NOTE — Telephone Encounter (Signed)
1) Medication(s) Requested (by name): Naproxen Losartan Potassium Atorvastatin     2) Pharmacy of Choice:  [facility], [place].

## 2018-11-28 NOTE — Telephone Encounter (Signed)
She was scheduled to see me today but canceled her office visit.  She needs a visit.

## 2018-11-28 NOTE — Telephone Encounter (Signed)
Patient called wanting to know if she could be referred to get a mammogram. Please follow up

## 2018-11-28 NOTE — Telephone Encounter (Signed)
Patient has not been seen since 02/2018. Will route to PCP to fill if appropriate.

## 2018-11-29 NOTE — Telephone Encounter (Signed)
Patient was called and informed that order was placed on 11/11/2018 by PC. Patient was given the phone number to contact [facility] and schedule an appointment.

## 2018-12-01 ENCOUNTER — Other Ambulatory Visit: Payer: Self-pay | Admitting: Family Medicine

## 2018-12-01 DIAGNOSIS — Z1231 Encounter for screening mammogram for malignant neoplasm of breast: Secondary | ICD-10-CM

## 2018-12-06 ENCOUNTER — Ambulatory Visit
Admission: RE | Admit: 2018-12-06 | Discharge: 2018-12-06 | Disposition: A | Discharge status: Home / Self Care | Payer: Medicaid Other | Source: Ambulatory Visit | Attending: Family Medicine | Admitting: Family Medicine

## 2018-12-06 ENCOUNTER — Other Ambulatory Visit: Payer: Self-pay

## 2018-12-06 DIAGNOSIS — Z1231 Encounter for screening mammogram for malignant neoplasm of breast: Secondary | ICD-10-CM

## 2018-12-08 ENCOUNTER — Telehealth: Payer: Self-pay

## 2018-12-08 DIAGNOSIS — K5790 Diverticulosis of intestine, part unspecified, without perforation or abscess without bleeding: Secondary | ICD-10-CM | POA: Diagnosis not present

## 2018-12-08 DIAGNOSIS — C499 Malignant neoplasm of connective and soft tissue, unspecified: Secondary | ICD-10-CM | POA: Diagnosis not present

## 2018-12-08 DIAGNOSIS — R911 Solitary pulmonary nodule: Secondary | ICD-10-CM | POA: Diagnosis not present

## 2018-12-08 NOTE — Telephone Encounter (Signed)
-----   Message from Charlott Rakes, MD sent at 12/08/2018  3:05 PM EST ----- Mammogram is negative for malignancy

## 2018-12-08 NOTE — Telephone Encounter (Signed)
Patient name and DOB has been verified Patient was informed of lab results. Patient had no questions.  

## 2018-12-13 ENCOUNTER — Other Ambulatory Visit: Payer: Self-pay | Admitting: Family Medicine

## 2018-12-13 ENCOUNTER — Ambulatory Visit: Payer: Medicaid Other | Admitting: Family Medicine

## 2018-12-13 DIAGNOSIS — M79605 Pain in left leg: Secondary | ICD-10-CM

## 2018-12-21 DIAGNOSIS — R1900 Intra-abdominal and pelvic swelling, mass and lump, unspecified site: Secondary | ICD-10-CM | POA: Diagnosis not present

## 2018-12-21 DIAGNOSIS — C494 Malignant neoplasm of connective and soft tissue of abdomen: Secondary | ICD-10-CM | POA: Diagnosis not present

## 2018-12-25 ENCOUNTER — Other Ambulatory Visit: Payer: Self-pay | Admitting: Family Medicine

## 2018-12-25 DIAGNOSIS — M79605 Pain in left leg: Secondary | ICD-10-CM

## 2018-12-26 ENCOUNTER — Encounter: Payer: Self-pay | Admitting: Family Medicine

## 2018-12-26 ENCOUNTER — Other Ambulatory Visit: Payer: Self-pay

## 2018-12-26 ENCOUNTER — Ambulatory Visit: Payer: Medicaid Other | Attending: Family Medicine | Admitting: Family Medicine

## 2018-12-26 VITALS — BP 172/71 | HR 81 | Temp 98.0°F | Ht 68.0 in | Wt 208.0 lb

## 2018-12-26 DIAGNOSIS — R1900 Intra-abdominal and pelvic swelling, mass and lump, unspecified site: Secondary | ICD-10-CM | POA: Diagnosis not present

## 2018-12-26 DIAGNOSIS — M25561 Pain in right knee: Secondary | ICD-10-CM

## 2018-12-26 DIAGNOSIS — I10 Essential (primary) hypertension: Secondary | ICD-10-CM

## 2018-12-26 DIAGNOSIS — G8929 Other chronic pain: Secondary | ICD-10-CM | POA: Diagnosis not present

## 2018-12-26 DIAGNOSIS — E7800 Pure hypercholesterolemia, unspecified: Secondary | ICD-10-CM

## 2018-12-26 DIAGNOSIS — M25562 Pain in left knee: Secondary | ICD-10-CM

## 2018-12-26 MED ORDER — LOSARTAN POTASSIUM 100 MG PO TABS: 100.0000 mg | ORAL_TABLET | Freq: Every day | ORAL | 6 refills | Status: DC

## 2018-12-26 MED ORDER — CARVEDILOL 25 MG PO TABS: 25.0000 mg | ORAL_TABLET | Freq: Two times a day (BID) | ORAL | 6 refills | Status: DC

## 2018-12-26 MED ORDER — NAPROXEN 500 MG PO TABS: ORAL_TABLET | ORAL | 3 refills | Status: DC

## 2018-12-26 MED ORDER — ATORVASTATIN CALCIUM 20 MG PO TABS: 20.0000 mg | ORAL_TABLET | Freq: Every day | ORAL | 6 refills | Status: DC

## 2018-12-26 MED ORDER — AMLODIPINE BESYLATE 10 MG PO TABS: 10.0000 mg | ORAL_TABLET | Freq: Every day | ORAL | 6 refills | Status: DC

## 2018-12-26 NOTE — Patient Instructions (Signed)

## 2018-12-27 ENCOUNTER — Telehealth: Payer: Self-pay | Admitting: *Deleted

## 2018-12-27 LAB — CMP14+EGFR
ALT: 15 IU/L (ref 0–32)
AST: 19 IU/L (ref 0–40)
Albumin/Globulin Ratio: 1.6 (ref 1.2–2.2)
Albumin: 4.1 g/dL (ref 3.8–4.8)
Alkaline Phosphatase: 91 IU/L (ref 39–117)
BUN/Creatinine Ratio: 16 (ref 12–28)
BUN: 13 mg/dL (ref 8–27)
Bilirubin Total: 0.3 mg/dL (ref 0.0–1.2)
CO2: 27 mmol/L (ref 20–29)
Calcium: 9.4 mg/dL (ref 8.7–10.3)
Chloride: 110 mmol/L — ABNORMAL HIGH (ref 96–106)
Creatinine, Ser: 0.79 mg/dL (ref 0.57–1.00)
GFR calc Af Amer: 93 mL/min/{1.73_m2} (ref >59)
GFR calc non Af Amer: 80 mL/min/{1.73_m2} (ref >59)
Globulin, Total: 2.6 g/dL (ref 1.5–4.5)
Glucose: 69 mg/dL (ref 65–99)
Potassium: 4.5 mmol/L (ref 3.5–5.2)
Sodium: 145 mmol/L — ABNORMAL HIGH (ref 134–144)
Total Protein: 6.7 g/dL (ref 6.0–8.5)

## 2018-12-27 LAB — CBC WITH DIFFERENTIAL/PLATELET
Basophils Absolute: 0 10*3/uL (ref 0.0–0.2)
Basos: 1 %
EOS (ABSOLUTE): 0.1 10*3/uL (ref 0.0–0.4)
Eos: 2 %
Hematocrit: 34.8 % (ref 34.0–46.6)
Hemoglobin: 11.7 g/dL (ref 11.1–15.9)
Immature Grans (Abs): 0 10*3/uL (ref 0.0–0.1)
Immature Granulocytes: 0 %
Lymphocytes Absolute: 1.2 10*3/uL (ref 0.7–3.1)
Lymphs: 28 %
MCH: 26.8 pg (ref 26.6–33.0)
MCHC: 33.6 g/dL (ref 31.5–35.7)
MCV: 80 fL (ref 79–97)
Monocytes Absolute: 0.4 10*3/uL (ref 0.1–0.9)
Monocytes: 9 %
Neutrophils Absolute: 2.6 10*3/uL (ref 1.4–7.0)
Neutrophils: 60 %
Platelets: 209 10*3/uL (ref 150–450)
RBC: 4.36 x10E6/uL (ref 3.77–5.28)
RDW: 14 % (ref 11.7–15.4)
WBC: 4.3 10*3/uL (ref 3.4–10.8)

## 2018-12-27 NOTE — Progress Notes (Addendum)
Subjective:  Patient ID: Kaylee Nelson, female    DOB: [birth year]  Age: 63 y.o. MRN: [id number]  CC: Hypertension   HPI Kaylee Nelson is a 63 year old female with history of hypertension, hyperlipidemia, chronic knee pain who was diagnosed with a retroperitoneal tumor in 04/2018. 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 She had a follow-up visit at the [facility] on 12/08/2018 and notes from care everywhere reviewed.  Imaging reveals no evidence of recurrent disease and she will continue surveillance.  Today her blood pressure is elevated and she endorses compliance with her antihypertensive.  Endorses compliance with her statin and denies adverse effects from it. She does have chronic knee pain and is requesting refill of naproxen. She has no additional concerns today.  Past Medical History:  Diagnosis Date  . Heart murmur    as a child  . Hypertension   . Phlegm in throat    LAST WEEK OR TWO NO FEVER  . Sickle cell trait [facility])     Past Surgical History:  Procedure Laterality Date  . ABDOMINAL HYSTERECTOMY    . ADRENALECTOMY N/A 08/18/2018   Procedure: OPEN LEFT ADRENALECTOMY;  Surgeon: Armandina Gemma, MD;  Location: [facility];  Service: General;  Laterality: N/A;  . CESAREAN SECTION      Family History  Problem Relation Age of Onset  . Cancer Mother   . Breast cancer Mother   . Cancer Maternal Aunt   . Breast cancer Maternal Aunt   . Cancer Daughter   . Breast cancer Daughter   . BRCA 1/2 Cousin     Allergies  Allergen Reactions  . Shrimp [Shellfish Allergy]   . Sulfa Antibiotics     Outpatient Medications Prior to Visit  Medication Sig Dispense Refill  . Potassium Chloride ER 20 MEQ TBCR Take 1 tablet by mouth once daily (Patient taking differently: Take 20 mEq by mouth daily. Take 1 tablet by mouth once  daily) 30 tablet 6  . atorvastatin (LIPITOR) 20 MG tablet Take 1 tablet by mouth once daily 14 tablet 0  . carvedilol (COREG) 25 MG tablet Take 1 tablet (25 mg total) by mouth 2 (two) times daily with a meal. 60 tablet 6  . losartan (COZAAR) 100 MG tablet Take 1 tablet (100 mg total) by mouth daily. 30 tablet 6  . naproxen (NAPROSYN) 500 MG tablet TAKE 1 TABLET BY MOUTH TWICE DAILY WITH A MEAL 14 tablet 0  . HYDROcodone-acetaminophen (NORCO/VICODIN) 5-325 MG tablet Take 1-2 tablets by mouth every 4 (four) hours as needed for moderate pain. (Patient not taking: Reported on 12/26/2018) 20 tablet 0   No facility-administered medications prior to visit.      ROS Review of Systems  Constitutional: Negative for activity change, appetite change and fatigue.  HENT: Negative for congestion, sinus pressure and sore throat.   Eyes: Negative for visual disturbance.  Respiratory: Negative for cough, chest tightness, shortness of breath and wheezing.   Cardiovascular: Negative for chest pain and palpitations.  Gastrointestinal: Negative for abdominal distention, abdominal pain and constipation.  Endocrine: Negative for polydipsia.  Genitourinary: Negative for dysuria and frequency.  Musculoskeletal: Negative for arthralgias and back pain.  Skin: Negative for rash.  Neurological: Negative for tremors, light-headedness and numbness.  Hematological: Does not bruise/bleed easily.  Psychiatric/Behavioral: Negative for agitation and behavioral problems.    Objective:  BP (!) 172/71  Pulse 81   Temp 98 F (36.7 C) (Oral)   Ht 5' 8" (1.727 m)   Wt 208 lb (94.3 kg)   SpO2 99%   BMI 31.63 kg/m   BP/Weight 12/26/2018 08/23/2018 05/11/8117  Systolic BP 147 829 -  Diastolic BP 71 73 -  Wt. (Lbs) 208 - 195.31  BMI 31.63 - 29.7      Physical Exam Constitutional:      Appearance: She is well-developed.  Neck:     Vascular: No JVD.  Cardiovascular:     Rate and Rhythm: Normal rate.     Heart  sounds: Murmur present.  Pulmonary:     Effort: Pulmonary effort is normal.     Breath sounds: Normal breath sounds. No wheezing or rales.  Chest:     Chest wall: No tenderness.  Abdominal:     General: Bowel sounds are normal. There is no distension.     Palpations: Abdomen is soft. There is no mass.     Tenderness: There is no abdominal tenderness.     Comments: Vertical abdominal scar which is healed  Musculoskeletal: Normal range of motion.     Right lower leg: No edema.     Left lower leg: No edema.  Neurological:     Mental Status: She is alert and oriented to person, place, and time.  Psychiatric:        Mood and Affect: Mood normal.     CMP Latest Ref Rng & Units 12/26/2018 08/20/2018 08/19/2018  Glucose 65 - 99 mg/dL 69 109(H) 175(H)  BUN 8 - 27 mg/dL 13 28(H) 18  Creatinine 0.57 - 1.00 mg/dL 0.79 1.67(H) 1.59(H)  Sodium 134 - 144 mmol/L 145(H) 140 143  Potassium 3.5 - 5.2 mmol/L 4.5 4.0 4.7  Chloride 96 - 106 mmol/L 110(H) 110 116(H)  CO2 20 - 29 mmol/L 27 22 20(L)  Calcium 8.7 - 10.3 mg/dL 9.4 7.4(L) 7.0(L)  Total Protein 6.0 - 8.5 g/dL 6.7 4.9(L) -  Total Bilirubin 0.0 - 1.2 mg/dL 0.3 0.5 -  Alkaline Phos 39 - 117 IU/L 91 47 -  AST 0 - 40 IU/L 19 40 -  ALT 0 - 32 IU/L 15 23 -    Lipid Panel     Component Value Date/Time   CHOL 212 (H) 04/21/2018 1117   TRIG 71 04/21/2018 1117   HDL 43 04/21/2018 1117   CHOLHDL 4.9 (H) 04/21/2018 1117   CHOLHDL 2.8 12/17/2015 1035   VLDL 10 12/17/2015 1035   LDLCALC 155 (H) 04/21/2018 1117    CBC    Component Value Date/Time   WBC 4.3 12/26/2018 1551   WBC 9.7 08/23/2018 1255   RBC 4.36 12/26/2018 1551   RBC 3.59 (L) 08/23/2018 1255   HGB 11.7 12/26/2018 1551   HCT 34.8 12/26/2018 1551   PLT 209 12/26/2018 1551   MCV 80 12/26/2018 1551   MCH 26.8 12/26/2018 1551   MCH 28.7 08/23/2018 1255   MCHC 33.6 12/26/2018 1551   MCHC 32.2 08/23/2018 1255   RDW 14.0 12/26/2018 1551   LYMPHSABS 1.2 12/26/2018 1551    MONOABS 1.2 (H) 08/20/2018 0755   EOSABS 0.1 12/26/2018 1551   BASOSABS 0.0 12/26/2018 1551    No results found for: HGBA1C  Assessment & Plan:   1. Chronic pain of both knees Chronic osteoarthritis Continue naproxen - naproxen (NAPROSYN) 500 MG tablet; TAKE 1 TABLET BY MOUTH TWICE DAILY WITH A MEAL  Dispense: 60 tablet; Refill: 3  2. Essential  hypertension Uncontrolled Amlodipine added to regimen Counseled on blood pressure goal of less than 130/80, low-sodium, DASH diet, medication compliance, 150 minutes of moderate intensity exercise per week. Discussed medication compliance, adverse effects. - amLODipine (NORVASC) 10 MG tablet; Take 1 tablet (10 mg total) by mouth daily.  Dispense: 30 tablet; Refill: 6 - carvedilol (COREG) 25 MG tablet; Take 1 tablet (25 mg total) by mouth 2 (two) times daily with a meal.  Dispense: 60 tablet; Refill: 6 - losartan (COZAAR) 100 MG tablet; Take 1 tablet (100 mg total) by mouth daily.  Dispense: 30 tablet; Refill: 6 - Complete Metabolic Panel with GFR - CBC with Differential/Platelet  3. Retroperitoneal mass Malignant solitary fibrous tumor Status post excision in 08/2018 No evidence of recurrent or progressive disease per general surgery notes Continue surveillance with [facility] ordered this encounter  Medications  . amLODipine (NORVASC) 10 MG tablet    Sig: Take 1 tablet (10 mg total) by mouth daily.    Dispense:  30 tablet    Refill:  6  . naproxen (NAPROSYN) 500 MG tablet    Sig: TAKE 1 TABLET BY MOUTH TWICE DAILY WITH A MEAL    Dispense:  60 tablet    Refill:  3  . carvedilol (COREG) 25 MG tablet    Sig: Take 1 tablet (25 mg total) by mouth 2 (two) times daily with a meal.    Dispense:  60 tablet    Refill:  6  . atorvastatin (LIPITOR) 20 MG tablet    Sig: Take 1 tablet (20 mg total) by mouth daily.    Dispense:  30 tablet    Refill:  6  . losartan (COZAAR) 100 MG tablet    Sig: Take 1 tablet  (100 mg total) by mouth daily.    Dispense:  30 tablet    Refill:  6    Follow-up: Return in about 3 months (around 03/28/2019) for medical conditions - in person.       Charlott Rakes, MD, FAAFP. [facility] [place], [place]   12/27/2018, 12:51 PM

## 2018-12-27 NOTE — Telephone Encounter (Signed)
-----   Message from Charlott Rakes, MD sent at 12/27/2018 12:00 PM EST ----- Please inform her labs are stable, kidney function is normal and anemia has resolved.

## 2018-12-27 NOTE — Telephone Encounter (Signed)
Patient verified DOB Patient is aware of labs being stable, kidney function and iron levels being normal.

## 2019-03-08 ENCOUNTER — Other Ambulatory Visit: Payer: Self-pay

## 2019-03-08 ENCOUNTER — Encounter: Payer: Self-pay | Admitting: Family Medicine

## 2019-03-08 ENCOUNTER — Ambulatory Visit: Payer: Medicaid Other | Attending: Family Medicine | Admitting: Family Medicine

## 2019-03-08 DIAGNOSIS — J45909 Unspecified asthma, uncomplicated: Secondary | ICD-10-CM | POA: Diagnosis not present

## 2019-03-08 DIAGNOSIS — Z79899 Other long term (current) drug therapy: Secondary | ICD-10-CM | POA: Insufficient documentation

## 2019-03-08 DIAGNOSIS — M5441 Lumbago with sciatica, right side: Secondary | ICD-10-CM | POA: Diagnosis not present

## 2019-03-08 DIAGNOSIS — D573 Sickle-cell trait: Secondary | ICD-10-CM | POA: Diagnosis not present

## 2019-03-08 DIAGNOSIS — Z882 Allergy status to sulfonamides status: Secondary | ICD-10-CM | POA: Diagnosis not present

## 2019-03-08 DIAGNOSIS — I10 Essential (primary) hypertension: Secondary | ICD-10-CM | POA: Diagnosis not present

## 2019-03-08 DIAGNOSIS — E785 Hyperlipidemia, unspecified: Secondary | ICD-10-CM | POA: Insufficient documentation

## 2019-03-08 MED ORDER — TIZANIDINE HCL 4 MG PO TABS: 4.0000 mg | ORAL_TABLET | Freq: Three times a day (TID) | ORAL | 1 refills | Status: DC | PRN

## 2019-03-08 MED ORDER — NAPROXEN 500 MG PO TABS: ORAL_TABLET | ORAL | 3 refills | Status: DC

## 2019-03-08 NOTE — Patient Instructions (Signed)
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
## 2019-03-09 ENCOUNTER — Encounter: Payer: Self-pay | Admitting: Family Medicine

## 2019-03-28 ENCOUNTER — Ambulatory Visit: Payer: Medicaid Other | Admitting: Family Medicine

## 2019-03-29 ENCOUNTER — Ambulatory Visit: Payer: Medicaid Other | Attending: Family Medicine | Admitting: Family Medicine

## 2019-03-29 ENCOUNTER — Other Ambulatory Visit: Payer: Self-pay

## 2019-04-06 DIAGNOSIS — Z1211 Encounter for screening for malignant neoplasm of colon: Secondary | ICD-10-CM | POA: Diagnosis not present

## 2019-04-06 DIAGNOSIS — D492 Neoplasm of unspecified behavior of bone, soft tissue, and skin: Secondary | ICD-10-CM | POA: Diagnosis not present

## 2019-04-11 ENCOUNTER — Ambulatory Visit: Payer: Medicaid Other | Attending: Family Medicine | Admitting: Physical Therapy

## 2019-04-26 ENCOUNTER — Other Ambulatory Visit: Payer: Self-pay | Admitting: Family Medicine

## 2019-04-26 DIAGNOSIS — E876 Hypokalemia: Secondary | ICD-10-CM

## 2019-05-04 DIAGNOSIS — C499 Malignant neoplasm of connective and soft tissue, unspecified: Secondary | ICD-10-CM | POA: Diagnosis not present

## 2019-05-04 DIAGNOSIS — Z85831 Personal history of malignant neoplasm of soft tissue: Secondary | ICD-10-CM | POA: Diagnosis not present

## 2019-05-15 ENCOUNTER — Telehealth: Payer: Self-pay | Admitting: Family Medicine

## 2019-05-15 MED ORDER — SPIRONOLACTONE 25 MG PO TABS: 25.0000 mg | ORAL_TABLET | Freq: Every day | ORAL | 3 refills | Status: DC

## 2019-05-15 NOTE — Telephone Encounter (Signed)
Patient was called and she states that both of her feet are swelling she states that they have been swelling foe 2 weeks. She states that she has been wearing her compression stocking and that has not helped. Please follow up with advice.

## 2019-05-15 NOTE — Telephone Encounter (Signed)
Patient called and requested to speak with pcp regarding her foot swelling. Please follow up at your earliest convenience.

## 2019-05-15 NOTE — Telephone Encounter (Signed)
The swelling could be due to amlodipine which I have discontinued and replaced this with spironolactone.  Please advise to also discontinue potassium as spironolactone can cause elevated potassium level.  Low-sodium, elevation of feet will also be beneficial

## 2019-05-17 NOTE — Telephone Encounter (Signed)
Patient called in regarding a missed call. Patient was identified by 2 patient identifiers. Patient was given note from pcp. Patient verbalized understanding and had no further questions or concerns at this time.

## 2019-05-17 NOTE — Telephone Encounter (Signed)
Patient was called and a voicemail was left informing patient to return phone call. 

## 2019-05-18 NOTE — Telephone Encounter (Signed)
NOTED

## 2019-06-15 ENCOUNTER — Telehealth: Payer: Self-pay | Admitting: Family Medicine

## 2019-06-15 DIAGNOSIS — I10 Essential (primary) hypertension: Secondary | ICD-10-CM

## 2019-06-15 NOTE — Telephone Encounter (Signed)
Patient called in and requested for listed medications to be refilled and sent to [facility] on [address].  losartan (COZAAR) 100 MG tablet [id number]  atorvastatin (LIPITOR) 20 MG tablet [id number]

## 2019-06-16 MED ORDER — LOSARTAN POTASSIUM 100 MG PO TABS: 100.0000 mg | ORAL_TABLET | Freq: Every day | ORAL | 2 refills | Status: DC

## 2019-06-16 MED ORDER — ATORVASTATIN CALCIUM 20 MG PO TABS: 20.0000 mg | ORAL_TABLET | Freq: Every day | ORAL | 2 refills | Status: DC

## 2019-06-16 NOTE — Telephone Encounter (Signed)
Rx sent 

## 2019-08-28 ENCOUNTER — Telehealth: Payer: Self-pay | Admitting: Family Medicine

## 2019-08-28 NOTE — Telephone Encounter (Signed)
Called patient and informed her there was not available appt today and advised her to go to urgent care. Patient stated she would go.    Copied from [place] [phone]. Topic: Appointment Scheduling - Scheduling Inquiry for Clinic >> Aug 28, 2019 10:25 AM Scherrie Gerlach wrote: Reason for CRM: Pt has red eye, itchy, watery and it aches if she rubs it too hard. For 2 days. Would like to see her dr asap. No answer at the office. Pt does have an eye dr.  Andrey Cota to call her eye dr at this time.

## 2019-08-31 DIAGNOSIS — H04123 Dry eye syndrome of bilateral lacrimal glands: Secondary | ICD-10-CM | POA: Diagnosis not present

## 2019-08-31 DIAGNOSIS — H25813 Combined forms of age-related cataract, bilateral: Secondary | ICD-10-CM | POA: Diagnosis not present

## 2019-08-31 DIAGNOSIS — B300 Keratoconjunctivitis due to adenovirus: Secondary | ICD-10-CM | POA: Diagnosis not present

## 2019-09-10 ENCOUNTER — Other Ambulatory Visit: Payer: Self-pay | Admitting: Family Medicine

## 2019-09-10 DIAGNOSIS — I10 Essential (primary) hypertension: Secondary | ICD-10-CM

## 2019-09-10 NOTE — Telephone Encounter (Signed)
Requested medication (s) are due for refill today: yes  Requested medication (s) are on the active medication list: yes  Last refill:  06/16/19  Future visit scheduled: no  Notes to clinic:  overdue bloodwork   Requested Prescriptions  Pending Prescriptions Disp Refills   atorvastatin (LIPITOR) 20 MG tablet [Pharmacy Med Name: Atorvastatin Calcium 20 MG Oral Tablet] 30 tablet     Sig: Take 1 tablet by mouth once daily      Cardiovascular:  Antilipid - Statins Failed - 09/10/2019 12:15 PM      Failed - Total Cholesterol in normal range and within 360 days    Cholesterol, Total  Date Value Ref Range Status  04/21/2018 212 (H) 100 - 199 mg/dL Final          Failed - LDL in normal range and within 360 days    LDL Calculated  Date Value Ref Range Status  04/21/2018 155 (H) 0 - 99 mg/dL Final          Failed - HDL in normal range and within 360 days    HDL  Date Value Ref Range Status  04/21/2018 43 >39 mg/dL Final          Failed - Triglycerides in normal range and within 360 days    Triglycerides  Date Value Ref Range Status  04/21/2018 71 0 - 149 mg/dL Final          Passed - Patient is not pregnant      Passed - Valid encounter within last 12 months    Recent Outpatient Visits           6 months ago Acute right-sided low back pain with right-sided sciatica   [place], [place], MD   8 months ago Retroperitoneal mass   [facility] Charlott Rakes, MD   1 year ago Screening for colon cancer   [place], Charlane Ferretti, MD   1 year ago Screening for colon cancer   [place], Charlane Ferretti, MD   2 years ago Pedal edema   [place], Charlane Ferretti, MD               Signed Prescriptions Disp Refills   losartan (COZAAR) 100 MG tablet 90 tablet 0    Sig: Take 1 tablet by mouth once daily       Cardiovascular:  Angiotensin Receptor Blockers Failed - 09/10/2019 12:15 PM      Failed - Cr in normal range and within 180 days    Creat  Date Value Ref Range Status  03/27/2016 0.70 0.50 - 1.05 mg/dL Final    Comment:      For patients > or = 63 years of age: The upper reference limit for Creatinine is approximately 13% higher for people identified as African-American.      Creatinine, Ser  Date Value Ref Range Status  12/26/2018 0.79 0.57 - 1.00 mg/dL Final          Failed - K in normal range and within 180 days    Potassium  Date Value Ref Range Status  12/26/2018 4.5 3.5 - 5.2 mmol/L Final          Passed - Patient is not pregnant      Passed - Last BP in normal range    BP Readings from Last 1 Encounters:  03/08/19 131/74  Passed - Valid encounter within last 6 months    Recent Outpatient Visits           6 months ago Acute right-sided low back pain with right-sided sciatica   [place], [place], MD   8 months ago Retroperitoneal mass   [place] [facility] [place], Charlane Ferretti, MD   1 year ago Screening for colon cancer   [place], Charlane Ferretti, MD   1 year ago Screening for colon cancer   [place], Charlane Ferretti, MD   2 years ago Pedal edema   [place], Charlane Ferretti, MD                spironolactone (ALDACTONE) 25 MG tablet 90 tablet 0    Sig: Take 1 tablet by mouth once daily      Cardiovascular: Diuretics - Aldosterone Antagonist Failed - 09/10/2019 12:15 PM      Failed - Na in normal range and within 360 days    Sodium  Date Value Ref Range Status  12/26/2018 145 (H) 134 - 144 mmol/L Final          Passed - Cr in normal range and within 360 days    Creat  Date Value Ref Range Status  03/27/2016 0.70 0.50 - 1.05 mg/dL Final    Comment:      For patients > or = 63 years of  age: The upper reference limit for Creatinine is approximately 13% higher for people identified as African-American.      Creatinine, Ser  Date Value Ref Range Status  12/26/2018 0.79 0.57 - 1.00 mg/dL Final          Passed - K in normal range and within 360 days    Potassium  Date Value Ref Range Status  12/26/2018 4.5 3.5 - 5.2 mmol/L Final          Passed - Last BP in normal range    BP Readings from Last 1 Encounters:  03/08/19 131/74          Passed - Valid encounter within last 6 months    Recent Outpatient Visits           6 months ago Acute right-sided low back pain with right-sided sciatica   [place], Enobong, MD   8 months ago Retroperitoneal mass   [place], Enobong, MD   1 year ago Screening for colon cancer   [place], Enobong, MD   1 year ago Screening for colon cancer   [place], Enobong, MD   2 years ago Pedal edema   [place], Enobong, MD

## 2019-09-10 NOTE — Telephone Encounter (Signed)
Requested Prescriptions  Pending Prescriptions Disp Refills   losartan (COZAAR) 100 MG tablet [Pharmacy Med Name: Losartan Potassium 100 MG Oral Tablet] 90 tablet 0    Sig: Take 1 tablet by mouth once daily     Cardiovascular:  Angiotensin Receptor Blockers Failed - 09/10/2019 12:15 PM      Failed - Cr in normal range and within 180 days    Creat  Date Value Ref Range Status  03/27/2016 0.70 0.50 - 1.05 mg/dL Final    Comment:      For patients > or = 63 years of age: The upper reference limit for Creatinine is approximately 13% higher for people identified as African-American.      Creatinine, Ser  Date Value Ref Range Status  12/26/2018 0.79 0.57 - 1.00 mg/dL Final         Failed - K in normal range and within 180 days    Potassium  Date Value Ref Range Status  12/26/2018 4.5 3.5 - 5.2 mmol/L Final         Passed - Patient is not pregnant      Passed - Last BP in normal range    BP Readings from Last 1 Encounters:  03/08/19 131/74         Passed - Valid encounter within last 6 months    Recent Outpatient Visits          6 months ago Acute right-sided low back pain with right-sided sciatica   [place], Charlane Ferretti, MD   8 months ago Retroperitoneal mass   [place] [facility] [place], Charlane Ferretti, MD   1 year ago Screening for colon cancer   [place], Charlane Ferretti, MD   1 year ago Screening for colon cancer   [place], Charlane Ferretti, MD   2 years ago Pedal edema   [place], [place], MD              atorvastatin (LIPITOR) 20 MG tablet [Pharmacy Med Name: Atorvastatin Calcium 20 MG Oral Tablet] 30 tablet     Sig: Take 1 tablet by mouth once daily     Cardiovascular:  Antilipid - Statins Failed - 09/10/2019 12:15 PM      Failed - Total Cholesterol in normal range and within 360 days    Cholesterol,  Total  Date Value Ref Range Status  04/21/2018 212 (H) 100 - 199 mg/dL Final         Failed - LDL in normal range and within 360 days    LDL Calculated  Date Value Ref Range Status  04/21/2018 155 (H) 0 - 99 mg/dL Final         Failed - HDL in normal range and within 360 days    HDL  Date Value Ref Range Status  04/21/2018 43 >39 mg/dL Final         Failed - Triglycerides in normal range and within 360 days    Triglycerides  Date Value Ref Range Status  04/21/2018 71 0 - 149 mg/dL Final         Passed - Patient is not pregnant      Passed - Valid encounter within last 12 months    Recent Outpatient Visits          6 months ago Acute right-sided low back pain with right-sided sciatica   [facility] Charlott Rakes, MD   8 months ago Retroperitoneal mass   [facility] [place], [place], MD   1 year ago Screening for colon cancer   [place], Charlane Ferretti, MD   1 year ago Screening for colon cancer   [place], Charlane Ferretti, MD   2 years ago Pedal edema   [place], Charlane Ferretti, MD              spironolactone (ALDACTONE) 25 MG tablet [Pharmacy Med Name: Spironolactone 25 MG Oral Tablet] 90 tablet 0    Sig: Take 1 tablet by mouth once daily     Cardiovascular: Diuretics - Aldosterone Antagonist Failed - 09/10/2019 12:15 PM      Failed - Na in normal range and within 360 days    Sodium  Date Value Ref Range Status  12/26/2018 145 (H) 134 - 144 mmol/L Final         Passed - Cr in normal range and within 360 days    Creat  Date Value Ref Range Status  03/27/2016 0.70 0.50 - 1.05 mg/dL Final    Comment:      For patients > or = 63 years of age: The upper reference limit for Creatinine is approximately 13% higher for people identified as African-American.      Creatinine, Ser  Date Value Ref Range  Status  12/26/2018 0.79 0.57 - 1.00 mg/dL Final         Passed - K in normal range and within 360 days    Potassium  Date Value Ref Range Status  12/26/2018 4.5 3.5 - 5.2 mmol/L Final         Passed - Last BP in normal range    BP Readings from Last 1 Encounters:  03/08/19 131/74         Passed - Valid encounter within last 6 months    Recent Outpatient Visits          6 months ago Acute right-sided low back pain with right-sided sciatica   [place], Enobong, MD   8 months ago Retroperitoneal mass   [place], Enobong, MD   1 year ago Screening for colon cancer   [place], Enobong, MD   1 year ago Screening for colon cancer   [place], Enobong, MD   2 years ago Pedal edema   [place], Enobong, MD

## 2019-09-21 ENCOUNTER — Ambulatory Visit: Payer: Medicaid Other | Admitting: Physician Assistant

## 2019-09-27 ENCOUNTER — Telehealth: Payer: Self-pay

## 2019-09-27 NOTE — Telephone Encounter (Signed)
Patient was called and informed of FMLA paperwork being ready for pick up and faxed to employer.

## 2019-10-05 ENCOUNTER — Other Ambulatory Visit: Payer: Self-pay | Admitting: Family Medicine

## 2019-10-05 DIAGNOSIS — I10 Essential (primary) hypertension: Secondary | ICD-10-CM

## 2019-10-11 ENCOUNTER — Telehealth: Payer: Self-pay | Admitting: Family Medicine

## 2019-10-11 NOTE — Telephone Encounter (Signed)
Please advise.   Copied from [place] [phone]. Topic: General - Other >> Oct 11, 2019  8:49 AM Rainey Pines A wrote: Liane Comber with [place] insurance would like a callback I nregards to the Short term disability for anxiety and stress patient has applied for and he needs to know patients symptoms,diagnosis and treatment. Best ontact [phone]

## 2019-10-11 NOTE — Telephone Encounter (Signed)
Aaron Edelman with [employer] was called and she was given the provided information

## 2019-10-31 ENCOUNTER — Ambulatory Visit: Payer: Medicaid Other | Attending: Family Medicine | Admitting: Family Medicine

## 2019-10-31 ENCOUNTER — Encounter: Payer: Self-pay | Admitting: Family Medicine

## 2019-10-31 ENCOUNTER — Other Ambulatory Visit: Payer: Self-pay

## 2019-10-31 VITALS — BP 174/75 | HR 75 | Temp 98.5°F | Ht 68.0 in | Wt 222.8 lb

## 2019-10-31 DIAGNOSIS — I10 Essential (primary) hypertension: Secondary | ICD-10-CM | POA: Diagnosis not present

## 2019-10-31 DIAGNOSIS — Z13228 Encounter for screening for other metabolic disorders: Secondary | ICD-10-CM

## 2019-10-31 DIAGNOSIS — E7800 Pure hypercholesterolemia, unspecified: Secondary | ICD-10-CM | POA: Diagnosis not present

## 2019-10-31 DIAGNOSIS — Z1211 Encounter for screening for malignant neoplasm of colon: Secondary | ICD-10-CM

## 2019-10-31 DIAGNOSIS — R600 Localized edema: Secondary | ICD-10-CM

## 2019-10-31 MED ORDER — SPIRONOLACTONE 25 MG PO TABS: 25.0000 mg | ORAL_TABLET | Freq: Every day | ORAL | 1 refills | Status: DC

## 2019-10-31 MED ORDER — CARVEDILOL 25 MG PO TABS: 25.0000 mg | ORAL_TABLET | Freq: Two times a day (BID) | ORAL | 1 refills | Status: DC

## 2019-10-31 MED ORDER — LOSARTAN POTASSIUM 100 MG PO TABS: 100.0000 mg | ORAL_TABLET | Freq: Every day | ORAL | 1 refills | Status: DC

## 2019-10-31 MED ORDER — ATORVASTATIN CALCIUM 20 MG PO TABS: 20.0000 mg | ORAL_TABLET | Freq: Every day | ORAL | 1 refills | Status: DC

## 2019-10-31 NOTE — Patient Instructions (Signed)
DASH Eating Plan DASH stands for "Dietary Approaches to Stop Hypertension." The DASH eating plan is a healthy eating plan that has been shown to reduce high blood pressure (hypertension). It may also reduce your risk for type 2 diabetes, heart disease, and stroke. The DASH eating plan may also help with weight loss. What are tips for following this plan?  General guidelines  Avoid eating more than 2,300 mg (milligrams) of salt (sodium) a day. If you have hypertension, you may need to reduce your sodium intake to 1,500 mg a day.  Limit alcohol intake to no more than 1 drink a day for nonpregnant women and 2 drinks a day for men. One drink equals 12 oz of beer, 5 oz of wine, or 1 oz of hard liquor.  Work with your health care provider to maintain a healthy body weight or to lose weight. Ask what an ideal weight is for you.  Get at least 30 minutes of exercise that causes your heart to beat faster (aerobic exercise) most days of the week. Activities may include walking, swimming, or biking.  Work with your health care provider or diet and nutrition specialist (dietitian) to adjust your eating plan to your individual calorie needs. Reading food labels   Check food labels for the amount of sodium per serving. Choose foods with less than 5 percent of the Daily Value of sodium. Generally, foods with less than 300 mg of sodium per serving fit into this eating plan.  To find whole grains, look for the word "whole" as the first word in the ingredient list. Shopping  Buy products labeled as "low-sodium" or "no salt added."  Buy fresh foods. Avoid canned foods and premade or frozen meals. Cooking  Avoid adding salt when cooking. Use salt-free seasonings or herbs instead of table salt or sea salt. Check with your health care provider or pharmacist before using salt substitutes.  Do not fry foods. Cook foods using healthy methods such as baking, boiling, grilling, and broiling instead.  Cook with  heart-healthy oils, such as olive, canola, soybean, or sunflower oil. Meal planning  Eat a balanced diet that includes: ? 5 or more servings of fruits and vegetables each day. At each meal, try to fill half of your plate with fruits and vegetables. ? Up to 6-8 servings of whole grains each day. ? Less than 6 oz of lean meat, poultry, or fish each day. A 3-oz serving of meat is about the same size as a deck of cards. One egg equals 1 oz. ? 2 servings of low-fat dairy each day. ? A serving of nuts, seeds, or beans 5 times each week. ? Heart-healthy fats. Healthy fats called Omega-3 fatty acids are found in foods such as flaxseeds and coldwater fish, like sardines, salmon, and mackerel.  Limit how much you eat of the following: ? Canned or prepackaged foods. ? Food that is high in trans fat, such as fried foods. ? Food that is high in saturated fat, such as fatty meat. ? Sweets, desserts, sugary drinks, and other foods with added sugar. ? Full-fat dairy products.  Do not salt foods before eating.  Try to eat at least 2 vegetarian meals each week.  Eat more home-cooked food and less restaurant, buffet, and fast food.  When eating at a restaurant, ask that your food be prepared with less salt or no salt, if possible. What foods are recommended? The items listed may not be a complete list. Talk with your dietitian about   what dietary choices are best for you. Grains Whole-grain or whole-wheat bread. Whole-grain or whole-wheat pasta. Brown rice. Oatmeal. Quinoa. Bulgur. Whole-grain and low-sodium cereals. Pita bread. Low-fat, low-sodium crackers. Whole-wheat flour tortillas. Vegetables Fresh or frozen vegetables (raw, steamed, roasted, or grilled). Low-sodium or reduced-sodium tomato and vegetable juice. Low-sodium or reduced-sodium tomato sauce and tomato paste. Low-sodium or reduced-sodium canned vegetables. Fruits All fresh, dried, or frozen fruit. Canned fruit in natural juice (without  added sugar). Meat and other protein foods Skinless chicken or turkey. Ground chicken or turkey. Pork with fat trimmed off. Fish and seafood. Egg whites. Dried beans, peas, or lentils. Unsalted nuts, nut butters, and seeds. Unsalted canned beans. Lean cuts of beef with fat trimmed off. Low-sodium, lean deli meat. Dairy Low-fat (1%) or fat-free (skim) milk. Fat-free, low-fat, or reduced-fat cheeses. Nonfat, low-sodium ricotta or cottage cheese. Low-fat or nonfat yogurt. Low-fat, low-sodium cheese. Fats and oils Soft margarine without trans fats. Vegetable oil. Low-fat, reduced-fat, or light mayonnaise and salad dressings (reduced-sodium). Canola, safflower, olive, soybean, and sunflower oils. Avocado. Seasoning and other foods Herbs. Spices. Seasoning mixes without salt. Unsalted popcorn and pretzels. Fat-free sweets. What foods are not recommended? The items listed may not be a complete list. Talk with your dietitian about what dietary choices are best for you. Grains Baked goods made with fat, such as croissants, muffins, or some breads. Dry pasta or rice meal packs. Vegetables Creamed or fried vegetables. Vegetables in a cheese sauce. Regular canned vegetables (not low-sodium or reduced-sodium). Regular canned tomato sauce and paste (not low-sodium or reduced-sodium). Regular tomato and vegetable juice (not low-sodium or reduced-sodium). Pickles. Olives. Fruits Canned fruit in a light or heavy syrup. Fried fruit. Fruit in cream or butter sauce. Meat and other protein foods Fatty cuts of meat. Ribs. Fried meat. Bacon. Sausage. Bologna and other processed lunch meats. Salami. Fatback. Hotdogs. Bratwurst. Salted nuts and seeds. Canned beans with added salt. Canned or smoked fish. Whole eggs or egg yolks. Chicken or turkey with skin. Dairy Whole or 2% milk, cream, and half-and-half. Whole or full-fat cream cheese. Whole-fat or sweetened yogurt. Full-fat cheese. Nondairy creamers. Whipped toppings.  Processed cheese and cheese spreads. Fats and oils Butter. Stick margarine. Lard. Shortening. Ghee. Bacon fat. Tropical oils, such as coconut, palm kernel, or palm oil. Seasoning and other foods Salted popcorn and pretzels. Onion salt, garlic salt, seasoned salt, table salt, and sea salt. Worcestershire sauce. Tartar sauce. Barbecue sauce. Teriyaki sauce. Soy sauce, including reduced-sodium. Steak sauce. Canned and packaged gravies. Fish sauce. Oyster sauce. Cocktail sauce. Horseradish that you find on the shelf. Ketchup. Mustard. Meat flavorings and tenderizers. Bouillon cubes. Hot sauce and Tabasco sauce. Premade or packaged marinades. Premade or packaged taco seasonings. Relishes. Regular salad dressings. Where to find more information:  National Heart, Lung, and Blood Institute: www.nhlbi.nih.gov  American Heart Association: www.heart.org Summary  The DASH eating plan is a healthy eating plan that has been shown to reduce high blood pressure (hypertension). It may also reduce your risk for type 2 diabetes, heart disease, and stroke.  With the DASH eating plan, you should limit salt (sodium) intake to 2,300 mg a day. If you have hypertension, you may need to reduce your sodium intake to 1,500 mg a day.  When on the DASH eating plan, aim to eat more fresh fruits and vegetables, whole grains, lean proteins, low-fat dairy, and heart-healthy fats.  Work with your health care provider or diet and nutrition specialist (dietitian) to adjust your eating plan to your   individual calorie needs. This information is not intended to replace advice given to you by your health care provider. Make sure you discuss any questions you have with your health care provider. Document Revised: 01/01/2017 Document Reviewed: 01/13/2016 Elsevier Patient Education  2020 [employer].  

## 2019-10-31 NOTE — Progress Notes (Signed)
Medication refills.  Cologuard has been ordered.  Needs letter to return to work.

## 2019-10-31 NOTE — Progress Notes (Signed)
Subjective:  Patient ID: Kaylee Nelson, female    DOB: [birth year]  Age: 64 y.o. 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  Seen at the sarcoma clinic at [facility] in 05/2019 and no evidence of locally recurrent or metastatic disease per notes and she would need to return in 4 months for imaging. Her up coming appointment is in 3 days.  She had previously requested some time off work due to increased stressors but is ready to return to work at this time on 11/06/19 Every time she thinks about work she gets stressed.  Not taking Amlodipne as she had complained of pedal edema and has also not been taking spironolactone even though it appears on her med list. She has been taking on Coreg and Losartan for her HTN and her BP is elevated at 174/75. Compliant with Lipitor for Hypercholesterolemia. She informs me she feels heavy but review of her chart signifies a 1.8 lb weight gain in thge last 6 months.  Past Medical History:  Diagnosis Date  . Heart murmur    as a child  . Hypertension   . Phlegm in throat    LAST WEEK OR TWO NO FEVER  . Sickle cell trait [facility])     Past Surgical History:  Procedure Laterality Date  . ABDOMINAL HYSTERECTOMY    . ADRENALECTOMY N/A 08/18/2018   Procedure: OPEN LEFT ADRENALECTOMY;  Surgeon: Armandina Gemma, MD;  Location: [facility];  Service: General;  Laterality: N/A;  . CESAREAN SECTION      Family History  Problem Relation Age of Onset  . Cancer Mother   . Breast cancer Mother   . Cancer Maternal Aunt   . Breast cancer Maternal Aunt   . Cancer Daughter   . Breast cancer Daughter   . BRCA 1/2 Cousin     Allergies  Allergen Reactions  . Shrimp [Shellfish Allergy]   . Sulfa Antibiotics     Outpatient Medications Prior to Visit  Medication Sig Dispense Refill   . naproxen (NAPROSYN) 500 MG tablet TAKE 1 TABLET BY MOUTH TWICE DAILY WITH A MEAL 60 tablet 3  . tiZANidine (ZANAFLEX) 4 MG tablet Take 1 tablet (4 mg total) by mouth every 8 (eight) hours as needed for muscle spasms. 90 tablet 1  . amLODipine (NORVASC) 10 MG tablet Take 1 tablet (10 mg total) by mouth daily. 30 tablet 6  . atorvastatin (LIPITOR) 20 MG tablet Take 1 tablet (20 mg total) by mouth daily. Must have office visit for refills 90 tablet 0  . carvedilol (COREG) 25 MG tablet TAKE 1 TABLET BY MOUTH TWICE DAILY WITH MEALS 60 tablet 0  . losartan (COZAAR) 100 MG tablet Take 1 tablet by mouth once daily 90 tablet 0  . spironolactone (ALDACTONE) 25 MG tablet Take 1 tablet by mouth once daily 90 tablet 0  . HYDROcodone-acetaminophen (NORCO/VICODIN) 5-325 MG tablet Take 1-2 tablets by mouth every 4 (four) hours as needed for moderate pain. (Patient not taking: Reported on 12/26/2018) 20 tablet 0   No facility-administered medications prior to visit.     ROS Review of Systems  Constitutional: Negative for activity change, appetite change and fatigue.  HENT: Negative for congestion, sinus pressure and sore throat.   Eyes: Negative for visual disturbance.  Respiratory: Negative for cough, chest tightness, shortness of breath  and wheezing.   Cardiovascular: Negative for chest pain and palpitations.  Gastrointestinal: Negative for abdominal distention, abdominal pain and constipation.  Endocrine: Negative for polydipsia.  Genitourinary: Negative for dysuria and frequency.  Musculoskeletal: Negative for arthralgias and back pain.  Skin: Negative for rash.  Neurological: Negative for tremors, light-headedness and numbness.  Hematological: Does not bruise/bleed easily.  Psychiatric/Behavioral: Negative for agitation and behavioral problems.    Objective:  BP (!) 174/75   Pulse 75   Temp 98.5 F (36.9 C) (Oral)   Ht '5\' 8"'  (1.727 m)   Wt 222 lb 12.8 oz (101.1 kg)   SpO2 100%   BMI  33.88 kg/m   BP/Weight 10/31/2019 03/08/2019 10/93/2355  Systolic BP 732 202 542  Diastolic BP 75 74 71  Wt. (Lbs) 222.8 221 208  BMI 33.88 33.6 31.63      Physical Exam Constitutional:      Appearance: She is well-developed.  Neck:     Vascular: No JVD.  Cardiovascular:     Rate and Rhythm: Normal rate.     Heart sounds: Normal heart sounds. No murmur heard.   Pulmonary:     Effort: Pulmonary effort is normal.     Breath sounds: Normal breath sounds. No wheezing or rales.  Chest:     Chest wall: No tenderness.  Abdominal:     General: Bowel sounds are normal. There is no distension.     Palpations: Abdomen is soft. There is no mass.     Tenderness: There is no abdominal tenderness.  Musculoskeletal:        General: Normal range of motion.     Right lower leg: No edema.     Left lower leg: No edema.  Neurological:     Mental Status: She is alert and oriented to person, place, and time.  Psychiatric:        Mood and Affect: Mood normal.     CMP Latest Ref Rng & Units 12/26/2018 08/20/2018 08/19/2018  Glucose 65 - 99 mg/dL 69 109(H) 175(H)  BUN 8 - 27 mg/dL 13 28(H) 18  Creatinine 0.57 - 1.00 mg/dL 0.79 1.67(H) 1.59(H)  Sodium 134 - 144 mmol/L 145(H) 140 143  Potassium 3.5 - 5.2 mmol/L 4.5 4.0 4.7  Chloride 96 - 106 mmol/L 110(H) 110 116(H)  CO2 20 - 29 mmol/L 27 22 20(L)  Calcium 8.7 - 10.3 mg/dL 9.4 7.4(L) 7.0(L)  Total Protein 6.0 - 8.5 g/dL 6.7 4.9(L) -  Total Bilirubin 0.0 - 1.2 mg/dL 0.3 0.5 -  Alkaline Phos 39 - 117 IU/L 91 47 -  AST 0 - 40 IU/L 19 40 -  ALT 0 - 32 IU/L 15 23 -    Lipid Panel     Component Value Date/Time   CHOL 212 (H) 04/21/2018 1117   TRIG 71 04/21/2018 1117   HDL 43 04/21/2018 1117   CHOLHDL 4.9 (H) 04/21/2018 1117   CHOLHDL 2.8 12/17/2015 1035   VLDL 10 12/17/2015 1035   LDLCALC 155 (H) 04/21/2018 1117    CBC    Component Value Date/Time   WBC 4.3 12/26/2018 1551   WBC 9.7 08/23/2018 1255   RBC 4.36 12/26/2018 1551   RBC  3.59 (L) 08/23/2018 1255   HGB 11.7 12/26/2018 1551   HCT 34.8 12/26/2018 1551   PLT 209 12/26/2018 1551   MCV 80 12/26/2018 1551   MCH 26.8 12/26/2018 1551   MCH 28.7 08/23/2018 1255   MCHC 33.6 12/26/2018 1551   MCHC 32.2  08/23/2018 1255   RDW 14.0 12/26/2018 1551   LYMPHSABS 1.2 12/26/2018 1551   MONOABS 1.2 (H) 08/20/2018 0755   EOSABS 0.1 12/26/2018 1551   BASOSABS 0.0 12/26/2018 1551    No results found for: HGBA1C  Assessment & Plan:  1. Essential hypertension Uncontrolled She has not been taking Spironolactone which she has been advised to restart Discontinue Amlodipine due to intolerance Continue Losartan and Coreg Counseled on blood pressure goal of less than 130/80, low-sodium, DASH diet, medication compliance, 150 minutes of moderate intensity exercise per week. Discussed medication compliance, adverse effects. - spironolactone (ALDACTONE) 25 MG tablet; Take 1 tablet (25 mg total) by mouth daily.  Dispense: 90 tablet; Refill: 1 - losartan (COZAAR) 100 MG tablet; Take 1 tablet (100 mg total) by mouth daily.  Dispense: 90 tablet; Refill: 1 - carvedilol (COREG) 25 MG tablet; Take 1 tablet (25 mg total) by mouth 2 (two) times daily with a meal.  Dispense: 180 tablet; Refill: 1 - CMP14+EGFR - LP+Non-HDL Cholesterol  2. Screening for colon cancer - Ambulatory referral to Gastroenterology  3. Pedal edema Discontinued Amlodipine Low sodium, use compression stockings - Brain natriuretic peptide  4. Screening for metabolic disorder Given complains of heaviness will screen for thyroid disorder - TSH - T4, free  5. Pure hypercholesterolemia Controlled Low cholesterol diet - atorvastatin (LIPITOR) 20 MG tablet; Take 1 tablet (20 mg total) by mouth daily.  Dispense: 90 tablet; Refill: 1  Provided note to return to work.   Meds ordered this encounter  Medications  . spironolactone (ALDACTONE) 25 MG tablet    Sig: Take 1 tablet (25 mg total) by mouth daily.     Dispense:  90 tablet    Refill:  1  . losartan (COZAAR) 100 MG tablet    Sig: Take 1 tablet (100 mg total) by mouth daily.    Dispense:  90 tablet    Refill:  1  . carvedilol (COREG) 25 MG tablet    Sig: Take 1 tablet (25 mg total) by mouth 2 (two) times daily with a meal.    Dispense:  180 tablet    Refill:  1  . atorvastatin (LIPITOR) 20 MG tablet    Sig: Take 1 tablet (20 mg total) by mouth daily.    Dispense:  90 tablet    Refill:  1    Follow-up: Return in about 3 months (around 01/30/2020) for Chronic disease management.       Charlott Rakes, MD, FAAFP. [facility] [place], [place]   10/31/2019, 12:34 PM

## 2019-11-01 ENCOUNTER — Telehealth: Payer: Self-pay | Admitting: Family Medicine

## 2019-11-01 LAB — CMP14+EGFR
ALT: 12 IU/L (ref 0–32)
AST: 17 IU/L (ref 0–40)
Albumin/Globulin Ratio: 1.7 (ref 1.2–2.2)
Albumin: 4.4 g/dL (ref 3.8–4.8)
Alkaline Phosphatase: 103 IU/L (ref 44–121)
BUN/Creatinine Ratio: 11 — ABNORMAL LOW (ref 12–28)
BUN: 8 mg/dL (ref 8–27)
Bilirubin Total: 0.4 mg/dL (ref 0.0–1.2)
CO2: 25 mmol/L (ref 20–29)
Calcium: 9.4 mg/dL (ref 8.7–10.3)
Chloride: 107 mmol/L — ABNORMAL HIGH (ref 96–106)
Creatinine, Ser: 0.76 mg/dL (ref 0.57–1.00)
GFR calc Af Amer: 97 mL/min/{1.73_m2} (ref >59)
GFR calc non Af Amer: 84 mL/min/{1.73_m2} (ref >59)
Globulin, Total: 2.6 g/dL (ref 1.5–4.5)
Glucose: 99 mg/dL (ref 65–99)
Potassium: 3.9 mmol/L (ref 3.5–5.2)
Sodium: 144 mmol/L (ref 134–144)
Total Protein: 7 g/dL (ref 6.0–8.5)

## 2019-11-01 LAB — TSH: TSH: 1.15 u[IU]/mL (ref 0.450–4.500)

## 2019-11-01 LAB — T4, FREE: Free T4: 1.09 ng/dL (ref 0.82–1.77)

## 2019-11-01 LAB — LP+NON-HDL CHOLESTEROL
Cholesterol, Total: 225 mg/dL — ABNORMAL HIGH (ref 100–199)
HDL: 61 mg/dL (ref >39)
LDL Chol Calc (NIH): 153 mg/dL — ABNORMAL HIGH (ref 0–99)
Total Non-HDL-Chol (LDL+VLDL): 164 mg/dL — ABNORMAL HIGH (ref 0–129)
Triglycerides: 62 mg/dL (ref 0–149)
VLDL Cholesterol Cal: 11 mg/dL (ref 5–40)

## 2019-11-01 LAB — BRAIN NATRIURETIC PEPTIDE: BNP: 21.3 pg/mL (ref 0.0–100.0)

## 2019-11-01 NOTE — Telephone Encounter (Signed)
Copied from [place] [phone]. Topic: General - Inquiry >> Nov 01, 2019  2:26 PM Alease Frame wrote: Reason for CRM: Pt recently had blood work done in office and she is wanting to know if a sample of that can be tested for mold in the system . Please advise

## 2019-11-02 ENCOUNTER — Other Ambulatory Visit: Payer: Self-pay | Admitting: Family Medicine

## 2019-11-02 DIAGNOSIS — Z85831 Personal history of malignant neoplasm of soft tissue: Secondary | ICD-10-CM | POA: Diagnosis not present

## 2019-11-02 DIAGNOSIS — C499 Malignant neoplasm of connective and soft tissue, unspecified: Secondary | ICD-10-CM | POA: Diagnosis not present

## 2019-11-02 DIAGNOSIS — E7800 Pure hypercholesterolemia, unspecified: Secondary | ICD-10-CM

## 2019-11-02 MED ORDER — ATORVASTATIN CALCIUM 40 MG PO TABS: 40.0000 mg | ORAL_TABLET | Freq: Every day | ORAL | 1 refills | Status: DC

## 2019-11-02 NOTE — Telephone Encounter (Signed)
That will have to be a separate blood draw.

## 2019-11-02 NOTE — Telephone Encounter (Signed)
Will route to PCP for review. 

## 2019-11-02 NOTE — Telephone Encounter (Signed)
Call placed to patient and message states that call can not be completed at this time. 

## 2019-11-03 ENCOUNTER — Telehealth: Payer: Self-pay

## 2019-11-03 NOTE — Telephone Encounter (Signed)
Patient was called and informed to contact office to get appointment for the lab

## 2019-11-03 NOTE — Telephone Encounter (Signed)
Patient name and DOB has been verified Patient was informed of lab results. Patient had no questions.  

## 2019-11-03 NOTE — Telephone Encounter (Signed)
-----   Message from Charlott Rakes, MD sent at 11/02/2019 12:41 PM EDT ----- Cholesterol is elevated and I have increased Lipitor from 20 mg to 40 mg.  Please advise to adhere to low-cholesterol diet labs do not reveal evidence of fluid overload or heart failure.  Other labs are stable

## 2019-11-10 ENCOUNTER — Other Ambulatory Visit: Payer: Medicaid Other

## 2020-01-15 ENCOUNTER — Ambulatory Visit: Payer: Self-pay | Admitting: *Deleted

## 2020-01-15 NOTE — Telephone Encounter (Signed)
Patient calls in with moderate bilateral thigh pain with moving around and standing that began 2 weeks ago after an increase of Atorvastatin from 20 to 40 mg daily. Tylenol and muscle rub has not helped.Denies SOB/CP/redness/calf pain. Has mild swelling in her right foot all working all day-swelling goes down with elevation.  Advised to stop taking Atorvastatin until we contact her back.  Pharmacy on file and okay to leave a message on cell phone.Routing to office for physician to review. LOV  11/02/19.  Reason for Disposition . Leg pain  Answer Assessment - Initial Assessment Questions 1. ONSET: "When did the pain start?"      2 weeks ago 2. LOCATION: "Where is the pain located?"     Outer area of both thighs 3. PAIN: "How bad is the pain?"    (Scale 1-10; or mild, moderate, severe)   -  MILD (1-3): doesn't interfere with normal activities    -  MODERATE (4-7): interferes with normal activities (e.g., work or school) or awakens from sleep, limping    -  SEVERE (8-10): excruciating pain, unable to do any normal activities, unable to walk     5-6 now.  4. WORK OR EXERCISE: "Has there been any recent work or exercise that involved this part of the body?"    Works daily everyday on a concrete 5. CAUSE: "What do you think is causing the leg pain?"     unsure 6. OTHER SYMPTOMS: "Do you have any other symptoms?" (e.g., chest pain, back pain, breathing difficulty, swelling, rash, fever, numbness, weakness)     no 7. PREGNANCY: "Is there any chance you are pregnant?" "When was your last menstrual period?"    na  Protocols used: LEG PAIN-A-AH

## 2020-01-16 NOTE — Telephone Encounter (Signed)
She can revert back to her previous dose of atorvastatin.  It looks like while she was on the lower dose of 20 mg she did not have symptoms.  If symptoms persist on 20 mg of atorvastatin I would advise her to reduce dose to 20mg , 2 times a week.

## 2020-01-16 NOTE — Telephone Encounter (Signed)
Will route to PCP for review. 

## 2020-01-16 NOTE — Telephone Encounter (Signed)
Pt was called and informed to reduce medication to 20mg 

## 2020-01-30 ENCOUNTER — Ambulatory Visit: Payer: Medicaid Other | Admitting: Family Medicine

## 2020-03-18 ENCOUNTER — Ambulatory Visit: Payer: Medicaid Other | Admitting: Family Medicine

## 2020-03-28 ENCOUNTER — Other Ambulatory Visit: Payer: Self-pay | Admitting: Family Medicine

## 2020-03-28 DIAGNOSIS — I10 Essential (primary) hypertension: Secondary | ICD-10-CM

## 2020-03-28 MED ORDER — SPIRONOLACTONE 25 MG PO TABS: 25.0000 mg | ORAL_TABLET | Freq: Every day | ORAL | 0 refills | Status: DC

## 2020-03-28 NOTE — Telephone Encounter (Signed)
Medication Refill - Medication: spironolactone (ALDACTONE) 25 MG tablet    Has the patient contacted their pharmacy? Yes.   (Agent: If no, request that the patient contact the pharmacy for the refill.) (Agent: If yes, when and what did the pharmacy advise?)they advised that they would send electronically   Preferred Pharmacy (with phone number or street name): [place], [place] - [place]  [address], [place] [address]  Phone:  [phone] Fax:  [phone]   Agent: Please be advised that RX refills may take up to 3 business days. We ask that you follow-up with your pharmacy.

## 2020-04-22 ENCOUNTER — Ambulatory Visit: Payer: Medicaid Other | Attending: Family Medicine | Admitting: Family Medicine

## 2020-04-22 ENCOUNTER — Other Ambulatory Visit: Payer: Self-pay

## 2020-05-15 ENCOUNTER — Ambulatory Visit: Payer: Medicaid Other | Admitting: Family Medicine

## 2020-06-04 NOTE — Progress Notes (Signed)
Patient ID: Kaylee Nelson, female    DOB: 1955/04/23  MRN: [id number]  CC: Back and Leg Pain  Subjective: Kaylee Nelson is a 65 y.o. female who presents for back and leg pain. Her concerns today include:   1. BACK PAIN: Location: lower back pain, left > right Onset: January 2022 Description: 8/10  Radiation: bilateral hips and bilateral legs Worse with: standing, unable to put full pressure on legs when standing Better with: Ibuprofen helps some  Trauma: no Popping/clicking sounds with movement/bending: popping of bilateral knees with standing Muscle spasms: yes especially at bedtime, sleeping with pillow under back to help Comments: Reports she is in severe pain from ongoing symptoms for months.  Patient Active Problem List   Diagnosis Date Noted  . Shock ([place])   . 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 Pedal edema 10/12/2016  . Seasonal allergies 05/19/2016  . Hyperlipidemia 12/18/2015  . Essential hypertension 01/10/2014     Current Outpatient Medications on File Prior to Visit  Medication Sig Dispense Refill  . atorvastatin (LIPITOR) 40 MG tablet Take 1 tablet (40 mg total) by mouth daily. 90 tablet 1  . carvedilol (COREG) 25 MG tablet Take 1 tablet (25 mg total) by mouth 2 (two) times daily with a meal. 180 tablet 1  . losartan (COZAAR) 100 MG tablet Take 1 tablet (100 mg total) by mouth daily. 90 tablet 1  . naproxen (NAPROSYN) 500 MG tablet TAKE 1 TABLET BY MOUTH TWICE DAILY WITH A MEAL 60 tablet 3  . spironolactone (ALDACTONE) 25 MG tablet Take 1 tablet (25 mg total) by mouth daily. 90 tablet 0   No current facility-administered medications on file prior to visit.    Allergies  Allergen Reactions  . Shrimp [Shellfish Allergy]   . Sulfa Antibiotics     Social History   Socioeconomic History  . Marital status: Single    Spouse name: Not on file  . Number of children: Not on file  . Years of education: Not on  file  . Highest education level: Not on file  Occupational History  . Not on file  Tobacco Use  . Smoking status: Never Smoker  . Smokeless tobacco: Never Used  Vaping Use  . Vaping Use: Never used  Substance and Sexual Activity  . Alcohol use: No    Alcohol/week: 0.0 standard drinks  . Drug use: No  . Sexual activity: Never  Other Topics Concern  . Not on file  Social History Narrative  . Not on file   Social Determinants of Health   Financial Resource Strain: Not on file  Food Insecurity: Not on file  Transportation Needs: Not on file  Physical Activity: Not on file  Stress: Not on file  Social Connections: Not on file  Intimate Partner Violence: Not on file    Family History  Problem Relation Age of Onset  . Cancer Mother   . Breast cancer Mother   . Cancer Maternal Aunt   . Breast cancer Maternal Aunt   . Cancer Daughter   . Breast cancer Daughter   . BRCA 1/2 Cousin     Past Surgical History:  Procedure Laterality Date  . ABDOMINAL HYSTERECTOMY    . ADRENALECTOMY N/A 08/18/2018   Procedure: OPEN LEFT ADRENALECTOMY;  Surgeon: Armandina Gemma, MD;  Location: [facility];  Service: General;  Laterality: N/A;  . CESAREAN SECTION      ROS: Review of Systems Negative except as stated  above  PHYSICAL EXAM: BP (!) 165/93 (BP Location: Left Arm, Patient Position: Sitting, Cuff Size: Normal)   Pulse 88   Temp 98.1 F (36.7 C)   Resp 15   Ht 5' 7.99" (1.727 m)   Wt 208 lb (94.3 kg)   SpO2 98%   BMI 31.63 kg/m   Physical Exam HENT:     Head: Normocephalic and atraumatic.  Eyes:     Extraocular Movements: Extraocular movements intact.     Conjunctiva/sclera: Conjunctivae normal.     Pupils: Pupils are equal, round, and reactive to light.  Cardiovascular:     Rate and Rhythm: Normal rate and regular rhythm.     Pulses: Normal pulses.     Heart sounds: Normal heart sounds.  Pulmonary:     Effort: Pulmonary effort is normal.     Breath sounds: Normal breath  sounds.  Musculoskeletal:     Cervical back: Normal range of motion and neck supple.  Neurological:     Mental Status: She is alert.  Psychiatric:        Mood and Affect: Mood normal.        Behavior: Behavior normal.    ASSESSMENT AND PLAN: 1. Chronic bilateral low back pain, unspecified whether sciatica present: - Diagnostic x-ray lumbar spine for further evaluation. - Tizanidine as prescribed. May cause drowsiness. Counseled patient to not consume if operating heavy machinery or driving. Counseled patient to not consume with alcohol. - Follow-up with primary provider as scheduled. - DG Lumbar Spine Complete; Future - tizanidine (ZANAFLEX) 2 MG tablet; Take 1 capsule (2 mg total) by mouth at bedtime as needed for muscle spasms.  Dispense: 14 capsule; Refill: 0  2. Chronic hip pain, bilateral: - Diagnostic x-rays of bilateral hips for further evaluation. - DG Hip Unilat W OR W/O Pelvis Min 4 Views Left; Future - DG Hip Unilat W OR W/O Pelvis Min 4 Views Right; Future  3. Chronic pain of both knees: - Diagnostic x-rays of bilateral knees for further evaluation. - DG Knee Complete 4 Views Right; Future - DG Knee Complete 4 Views Left; Future  4. Chronic pain of both lower extremities: - Diagnostic x-rays of bilateral lower extremities for further evaluation. - DG Tibia/Fibula Left; Future - DG Tibia/Fibula Right; Future   Patient was given the opportunity to ask questions.  Patient verbalized understanding of the plan and was able to repeat key elements of the plan. Patient was given clear instructions to go to Emergency Department or return to medical center if symptoms don't improve, worsen, or new problems develop.The patient verbalized understanding.   Orders Placed This Encounter  Procedures  . DG Tibia/Fibula Left  . DG Tibia/Fibula Right  . DG Hip Unilat W OR W/O Pelvis Min 4 Views Left  . DG Hip Unilat W OR W/O Pelvis Min 4 Views Right  . DG Lumbar Spine Complete  .  DG Knee Complete 4 Views Right  . DG Knee Complete 4 Views Left   Follow-up with primary provider as scheduled.   Camillia Herter, NP

## 2020-06-05 ENCOUNTER — Encounter (INDEPENDENT_AMBULATORY_CARE_PROVIDER_SITE_OTHER): Payer: Self-pay

## 2020-06-05 ENCOUNTER — Ambulatory Visit: Payer: BC Managed Care – PPO | Admitting: Family

## 2020-06-05 ENCOUNTER — Other Ambulatory Visit: Payer: Self-pay | Admitting: Family

## 2020-06-05 ENCOUNTER — Ambulatory Visit (INDEPENDENT_AMBULATORY_CARE_PROVIDER_SITE_OTHER): Payer: BC Managed Care – PPO

## 2020-06-05 ENCOUNTER — Other Ambulatory Visit: Payer: Self-pay

## 2020-06-05 VITALS — BP 165/93 | HR 88 | Temp 98.1°F | Resp 15 | Ht 67.99 in | Wt 208.0 lb

## 2020-06-05 DIAGNOSIS — M25562 Pain in left knee: Secondary | ICD-10-CM

## 2020-06-05 DIAGNOSIS — M25561 Pain in right knee: Secondary | ICD-10-CM

## 2020-06-05 DIAGNOSIS — M25552 Pain in left hip: Secondary | ICD-10-CM

## 2020-06-05 DIAGNOSIS — M545 Low back pain, unspecified: Secondary | ICD-10-CM

## 2020-06-05 DIAGNOSIS — M25551 Pain in right hip: Secondary | ICD-10-CM | POA: Diagnosis not present

## 2020-06-05 DIAGNOSIS — M79604 Pain in right leg: Secondary | ICD-10-CM

## 2020-06-05 DIAGNOSIS — G8929 Other chronic pain: Secondary | ICD-10-CM

## 2020-06-05 DIAGNOSIS — M79605 Pain in left leg: Secondary | ICD-10-CM | POA: Diagnosis not present

## 2020-06-05 DIAGNOSIS — M19072 Primary osteoarthritis, left ankle and foot: Secondary | ICD-10-CM | POA: Diagnosis not present

## 2020-06-05 MED ORDER — TIZANIDINE HCL 2 MG PO CAPS: 2.0000 mg | ORAL_CAPSULE | Freq: Every evening | ORAL | 0 refills | Status: DC | PRN

## 2020-06-05 NOTE — Progress Notes (Signed)
Pt experiencing pain in leg and back while sitting or standing been going on since January Pt stated symptoms started after starting Atorvastatin

## 2020-06-07 ENCOUNTER — Other Ambulatory Visit: Payer: Self-pay | Admitting: Family

## 2020-06-07 DIAGNOSIS — M545 Low back pain, unspecified: Secondary | ICD-10-CM

## 2020-06-07 DIAGNOSIS — G8929 Other chronic pain: Secondary | ICD-10-CM

## 2020-06-07 MED ORDER — TIZANIDINE HCL 2 MG PO TABS: 2.0000 mg | ORAL_TABLET | Freq: Every evening | ORAL | 0 refills | Status: DC | PRN

## 2020-06-07 NOTE — Telephone Encounter (Signed)
Tizanidine (Zanaflex) prescription updated from capsules to tablets at the same dosage and frequency.

## 2020-06-10 ENCOUNTER — Other Ambulatory Visit: Payer: Self-pay | Admitting: Family

## 2020-06-10 DIAGNOSIS — M160 Bilateral primary osteoarthritis of hip: Secondary | ICD-10-CM

## 2020-06-10 DIAGNOSIS — M47816 Spondylosis without myelopathy or radiculopathy, lumbar region: Secondary | ICD-10-CM

## 2020-06-10 DIAGNOSIS — M170 Bilateral primary osteoarthritis of knee: Secondary | ICD-10-CM

## 2020-06-10 NOTE — Progress Notes (Signed)
Bilateral feet heel spurs. Encouraged to rest, ice, and elevate as needed. Continue over-the-counter Ibuprofen and Acetaminophen.  Bilateral hips, bilateral knees, and lumbar spine with osteoarthritis. Referral to Orthopedics for further evaluation and management.

## 2020-06-13 ENCOUNTER — Telehealth: Payer: Self-pay | Admitting: Family

## 2020-06-13 NOTE — Telephone Encounter (Signed)
Pt  returning call about her xray results. Pt asked to please call her back on her phone,.

## 2020-07-02 ENCOUNTER — Other Ambulatory Visit: Payer: Self-pay

## 2020-07-02 ENCOUNTER — Ambulatory Visit (INDEPENDENT_AMBULATORY_CARE_PROVIDER_SITE_OTHER): Payer: BC Managed Care – PPO | Admitting: Family Medicine

## 2020-07-02 ENCOUNTER — Encounter: Payer: Self-pay | Admitting: Family Medicine

## 2020-07-02 DIAGNOSIS — M25562 Pain in left knee: Secondary | ICD-10-CM

## 2020-07-02 DIAGNOSIS — M25561 Pain in right knee: Secondary | ICD-10-CM

## 2020-07-02 DIAGNOSIS — G8929 Other chronic pain: Secondary | ICD-10-CM | POA: Diagnosis not present

## 2020-07-02 DIAGNOSIS — M545 Low back pain, unspecified: Secondary | ICD-10-CM

## 2020-07-02 DIAGNOSIS — M25551 Pain in right hip: Secondary | ICD-10-CM | POA: Diagnosis not present

## 2020-07-02 DIAGNOSIS — M25552 Pain in left hip: Secondary | ICD-10-CM | POA: Diagnosis not present

## 2020-07-02 MED ORDER — MELOXICAM 15 MG PO TABS: 7.5000 mg | ORAL_TABLET | Freq: Every day | ORAL | 6 refills | Status: DC | PRN

## 2020-07-02 NOTE — Progress Notes (Signed)
Office Visit Note   Patient: Kaylee Nelson           Date of Birth: 10/08/1955           MRN: [id number] Visit Date: 07/02/2020 Requested by: Camillia Herter, NP [address] [place],  [place] [address] PCP: Charlott Rakes, MD  Subjective: Chief Complaint  Patient presents with  . Other    Pain in lower back, bilateral hips and bilateral knees. Started in January of 2022. Started with pain in the lateral hips with walking. Pain then started in the knees with walking. Pain in the left lower back with much sitting. Tizanidine does help when she takes it, "but the ache is still there." Naproxen helps the knees.    HPI: She is here with low back pain, bilateral hip pain and bilateral knee pain.  Symptoms started in January after beginning to take Lipitor.  Shortly after taking a couple doses, she began feeling aches and pains in all of these areas.  She notified her PCP and stopped taking the medication for a while, the pain got little bit better but it did not go away completely.  She then resumed taking it again to see what would happen, and her pain intensified.  Now she is off all medications except for blood pressure pills.  She has tried Aleve/naproxen, but it was only taking the edge off her pain and not making it go away.  She denies any previous problems in any of these areas.  She does note that she has had bowed legs since childhood, but has never had pain with that.  She had x-rays taken recently which showed spondylosis in the lumbar spine, bilateral moderate to severe knee osteoarthritis, and normal-appearing hip joints.  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  No family history of rheumatologic disease to her knowledge.                ROS: No fevers or chills.  No rash.  All other systems were reviewed and are negative.  Objective: Vital Signs: There were no vitals taken for this visit.  Physical Exam:  General:  Alert and  oriented, in no acute distress. Pulm:  Breathing unlabored. Psy:  Normal mood, congruent affect.  Low back: She has poor posture, unable to stand completely upright.  She has tenderness near the left quadratus lumborum at the rib cage.  Both hips are tender over the greater trochanter.  No pain with internal hip rotation bilaterally.  Negative bilateral straight leg raise, lower extremity strength and reflexes are normal.  She has no effusion in her knees.  No laxity with varus or valgus stress.  Slight tenderness on the medial joint line.  She has bowing of her legs.    Imaging: No results found.  Assessment & Plan: 1.  Low back pain, bilateral greater trochanter pain and bilateral knee pain, seems to be related to starting Lipitor. -We will try meloxicam, glucosamine and turmeric.  Dietary limitation of sweets. -If pain has not improved in a couple weeks, she will contact me and I will order physical therapy referral.  We will also draw rheumatologic labs.       Procedures: No procedures performed        PMFS History: Patient Active Problem List   Diagnosis Date Noted  . Shock ([place])   . 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 Pedal edema  10/12/2016  . Seasonal allergies 05/19/2016  . Hyperlipidemia 12/18/2015  . Essential hypertension 01/10/2014   Past Medical History:  Diagnosis Date  . Heart murmur    as a child  . Hypertension   . Phlegm in throat    LAST WEEK OR TWO NO FEVER  . Sickle cell trait ([facility])     Family History  Problem Relation Age of Onset  . Cancer Mother   . Breast cancer Mother   . Cancer Maternal Aunt   . Breast cancer Maternal Aunt   . Cancer Daughter   . Breast cancer Daughter   . BRCA 1/2 Cousin     Past Surgical History:  Procedure Laterality Date  . ABDOMINAL HYSTERECTOMY    . ADRENALECTOMY N/A 08/18/2018   Procedure: OPEN LEFT ADRENALECTOMY;  Surgeon: Armandina Gemma, MD;  Location: [facility];  Service:  General;  Laterality: N/A;  . CESAREAN SECTION     Social History   Occupational History  . Not on file  Tobacco Use  . Smoking status: Never Smoker  . Smokeless tobacco: Never Used  Vaping Use  . Vaping Use: Never used  Substance and Sexual Activity  . Alcohol use: No    Alcohol/week: 0.0 standard drinks  . Drug use: No  . Sexual activity: Never

## 2020-07-02 NOTE — Patient Instructions (Signed)
   For joints:  Glucosamine Sulfate:  1,000 mg twice daily  Turmeric:  500 mg twice daily  Vitamin D3:  Take 5,000 IU daily   For blood pressure:  Magnesium 200-400 mg daily   Diet:  Try to limit intake of sugar/sweets.   If not getting better in a couple weeks, contact me and I'll order some additional blood tests, as well as a referral to physical therapy.

## 2020-07-03 DIAGNOSIS — K5730 Diverticulosis of large intestine without perforation or abscess without bleeding: Secondary | ICD-10-CM | POA: Diagnosis not present

## 2020-07-03 DIAGNOSIS — C499 Malignant neoplasm of connective and soft tissue, unspecified: Secondary | ICD-10-CM | POA: Diagnosis not present

## 2020-07-06 ENCOUNTER — Other Ambulatory Visit: Payer: Self-pay | Admitting: Family Medicine

## 2020-07-06 DIAGNOSIS — I10 Essential (primary) hypertension: Secondary | ICD-10-CM

## 2020-07-06 NOTE — Telephone Encounter (Signed)
Requested Prescriptions  Pending Prescriptions Disp Refills  . losartan (COZAAR) 100 MG tablet [Pharmacy Med Name: Losartan Potassium 100 MG Oral Tablet] 17 tablet 0    Sig: Take 1 tablet by mouth once daily     Cardiovascular:  Angiotensin Receptor Blockers Failed - 07/06/2020 12:39 PM      Failed - Cr in normal range and within 180 days    Creat  Date Value Ref Range Status  03/27/2016 0.70 0.50 - 1.05 mg/dL Final    Comment:      For patients > or = 64 years of age: The upper reference limit for Creatinine is approximately 13% higher for people identified as African-American.      Creatinine, Ser  Date Value Ref Range Status  10/31/2019 0.76 0.57 - 1.00 mg/dL Final         Failed - K in normal range and within 180 days    Potassium  Date Value Ref Range Status  10/31/2019 3.9 3.5 - 5.2 mmol/L Final         Failed - Last BP in normal range    BP Readings from Last 1 Encounters:  06/05/20 (!) 165/93         Passed - Patient is not pregnant      Passed - Valid encounter within last 6 months    Recent Outpatient Visits          8 months ago Screening for colon cancer   [place], [place], MD   1 year ago Acute right-sided low back pain with right-sided sciatica   [place], [place], MD   1 year ago Retroperitoneal mass   [facility] Charlott Rakes, MD   2 years ago Screening for colon cancer   [place], [place], MD   2 years ago Screening for colon cancer   [place], [place], MD      Future Appointments            In 2 weeks Charlott Rakes, MD [place]

## 2020-07-19 ENCOUNTER — Other Ambulatory Visit: Payer: Self-pay | Admitting: Family Medicine

## 2020-07-19 DIAGNOSIS — I10 Essential (primary) hypertension: Secondary | ICD-10-CM

## 2020-07-23 ENCOUNTER — Encounter: Payer: Self-pay | Admitting: Family Medicine

## 2020-07-23 ENCOUNTER — Other Ambulatory Visit: Payer: Self-pay

## 2020-07-23 ENCOUNTER — Ambulatory Visit: Payer: BC Managed Care – PPO | Attending: Family Medicine | Admitting: Family Medicine

## 2020-07-23 VITALS — BP 125/73 | HR 81 | Ht 68.0 in | Wt 216.0 lb

## 2020-07-23 DIAGNOSIS — Z1211 Encounter for screening for malignant neoplasm of colon: Secondary | ICD-10-CM | POA: Diagnosis not present

## 2020-07-23 DIAGNOSIS — Z789 Other specified health status: Secondary | ICD-10-CM

## 2020-07-23 DIAGNOSIS — I10 Essential (primary) hypertension: Secondary | ICD-10-CM | POA: Diagnosis not present

## 2020-07-23 DIAGNOSIS — M549 Dorsalgia, unspecified: Secondary | ICD-10-CM

## 2020-07-23 DIAGNOSIS — E7800 Pure hypercholesterolemia, unspecified: Secondary | ICD-10-CM

## 2020-07-23 MED ORDER — CARVEDILOL 25 MG PO TABS: 25.0000 mg | ORAL_TABLET | Freq: Two times a day (BID) | ORAL | 1 refills | Status: DC

## 2020-07-23 MED ORDER — LOSARTAN POTASSIUM 100 MG PO TABS: 1.0000 | ORAL_TABLET | Freq: Every day | ORAL | 1 refills | Status: DC

## 2020-07-23 MED ORDER — SPIRONOLACTONE 25 MG PO TABS: 25.0000 mg | ORAL_TABLET | Freq: Every day | ORAL | 1 refills | Status: DC

## 2020-07-23 NOTE — Progress Notes (Signed)
Subjective:  Patient ID: Kaylee Nelson, female    DOB: September 24, 1955  Age: 65 y.o. 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  Interval History: On 07/03/2020 she had a visit at the [facility] for follow-up of malignant solitary fibrous tumor and she is currently under surveillance with no evidence of recurrent or metastatic disease per notes.  She will follow-up in 6 months.  She has intermittent L flank pains worse with prolonged standing.  Seen by orthopedic Dr. Junius Roads and prescribed meloxicam which has been effective. She has not been taking Atorvastatin due to myalgias even on reduced dosing. Compliant with her antihypertensive and has no adverse effects from her medication.  Past Medical History:  Diagnosis Date   Heart murmur    as a child   Hypertension    Phlegm in throat    LAST WEEK OR TWO NO FEVER   Sickle cell trait (HCC)     Past Surgical History:  Procedure Laterality Date   ABDOMINAL HYSTERECTOMY     ADRENALECTOMY N/A 08/18/2018   Procedure: OPEN LEFT ADRENALECTOMY;  Surgeon: Armandina Gemma, MD;  Location: [facility];  Service: General;  Laterality: N/A;   CESAREAN SECTION      Family History  Problem Relation Age of Onset   Cancer Mother    Breast cancer Mother    Cancer Maternal Aunt    Breast cancer Maternal Aunt    Cancer Daughter    Breast cancer Daughter    BRCA 1/2 Cousin     Allergies  Allergen Reactions   Shrimp [Shellfish Allergy]    Sulfa Antibiotics     Outpatient Medications Prior to Visit  Medication Sig Dispense Refill   meloxicam (MOBIC) 15 MG tablet Take 0.5-1 tablets (7.5-15 mg total) by mouth daily as needed for pain. 30 tablet 6   naproxen (NAPROSYN) 500 MG tablet TAKE 1 TABLET BY MOUTH TWICE DAILY WITH A MEAL 60 tablet 3    carvedilol (COREG) 25 MG tablet Take 1 tablet (25 mg total) by mouth 2 (two) times daily with a meal. 180 tablet 1   losartan (COZAAR) 100 MG tablet Take 1 tablet by mouth once daily 17 tablet 0   spironolactone (ALDACTONE) 25 MG tablet Take 1 tablet (25 mg total) by mouth daily. 90 tablet 0   atorvastatin (LIPITOR) 40 MG tablet Take 1 tablet (40 mg total) by mouth daily. (Patient not taking: Reported on 07/23/2020) 90 tablet 1   tiZANidine (ZANAFLEX) 2 MG tablet Take 1 tablet (2 mg total) by mouth at bedtime as needed for muscle spasms. (Patient not taking: Reported on 07/23/2020) 30 tablet 0   No facility-administered medications prior to visit.     ROS Review of Systems  Constitutional:  Negative for activity change, appetite change and fatigue.  HENT:  Negative for congestion, sinus pressure and sore throat.   Eyes:  Negative for visual disturbance.  Respiratory:  Negative for cough, chest tightness, shortness of breath and wheezing.   Cardiovascular:  Negative for chest pain and palpitations.  Gastrointestinal:  Negative for abdominal distention, abdominal pain and constipation.  Endocrine: Negative for polydipsia.  Genitourinary:  Negative for dysuria and frequency.  Musculoskeletal:  Positive for back pain. Negative for arthralgias.  Skin:  Negative for rash.  Neurological:  Negative for tremors, light-headedness  and numbness.  Hematological:  Does not bruise/bleed easily.  Psychiatric/Behavioral:  Negative for agitation and behavioral problems.    Objective:  BP 125/73   Pulse 81   Ht '5\' 8"'  (1.727 m)   Wt 216 lb (98 kg)   SpO2 99%   BMI 32.84 kg/m   BP/Weight 07/23/2020 06/05/2020 7/61/6073  Systolic BP 710 626 948  Diastolic BP 73 93 75  Wt. (Lbs) 216 208 222.8  BMI 32.84 31.63 33.88      Physical Exam Constitutional:      Appearance: She is well-developed.  Neck:     Vascular: No JVD.  Cardiovascular:     Rate and Rhythm: Normal rate.     Heart sounds: Normal  heart sounds. No murmur heard. Pulmonary:     Effort: Pulmonary effort is normal.     Breath sounds: Normal breath sounds. No wheezing or rales.  Chest:     Chest wall: No tenderness.  Abdominal:     General: Bowel sounds are normal. There is no distension.     Palpations: Abdomen is soft. There is no mass.     Tenderness: There is no abdominal tenderness.  Musculoskeletal:        General: No tenderness (no TTP of lower back or flank). Normal range of motion.     Right lower leg: No edema.     Left lower leg: No edema.     Comments: Negative straight leg raise bilaterally  Neurological:     Mental Status: She is alert and oriented to person, place, and time.  Psychiatric:        Mood and Affect: Mood normal.    CMP Latest Ref Rng & Units 10/31/2019 12/26/2018 08/20/2018  Glucose 65 - 99 mg/dL 99 69 109(H)  BUN 8 - 27 mg/dL 8 13 28(H)  Creatinine 0.57 - 1.00 mg/dL 0.76 0.79 1.67(H)  Sodium 134 - 144 mmol/L 144 145(H) 140  Potassium 3.5 - 5.2 mmol/L 3.9 4.5 4.0  Chloride 96 - 106 mmol/L 107(H) 110(H) 110  CO2 20 - 29 mmol/L '25 27 22  ' Calcium 8.7 - 10.3 mg/dL 9.4 9.4 7.4(L)  Total Protein 6.0 - 8.5 g/dL 7.0 6.7 4.9(L)  Total Bilirubin 0.0 - 1.2 mg/dL 0.4 0.3 0.5  Alkaline Phos 44 - 121 IU/L 103 91 47  AST 0 - 40 IU/L 17 19 40  ALT 0 - 32 IU/L '12 15 23    ' Lipid Panel     Component Value Date/Time   CHOL 225 (H) 10/31/2019 1053   TRIG 62 10/31/2019 1053   HDL 61 10/31/2019 1053   CHOLHDL 4.9 (H) 04/21/2018 1117   CHOLHDL 2.8 12/17/2015 1035   VLDL 10 12/17/2015 1035   LDLCALC 153 (H) 10/31/2019 1053    CBC    Component Value Date/Time   WBC 4.3 12/26/2018 1551   WBC 9.7 08/23/2018 1255   RBC 4.36 12/26/2018 1551   RBC 3.59 (L) 08/23/2018 1255   HGB 11.7 12/26/2018 1551   HCT 34.8 12/26/2018 1551   PLT 209 12/26/2018 1551   MCV 80 12/26/2018 1551   MCH 26.8 12/26/2018 1551   MCH 28.7 08/23/2018 1255   MCHC 33.6 12/26/2018 1551   MCHC 32.2 08/23/2018 1255   RDW  14.0 12/26/2018 1551   LYMPHSABS 1.2 12/26/2018 1551   MONOABS 1.2 (H) 08/20/2018 0755   EOSABS 0.1 12/26/2018 1551   BASOSABS 0.0 12/26/2018 1551    No results found for: HGBA1C  Assessment & Plan:  1. Pure hypercholesterolemia Uncontrolled Unable to tolerate statin Will send of lipid panel and place on Zetia if still elevated - Lipid panel; Future - CMP14+EGFR; Future  2. Essential hypertension Controlled Counseled on blood pressure goal of less than 130/80, low-sodium, DASH diet, medication compliance, 150 minutes of moderate intensity exercise per week. Discussed medication compliance, adverse effects. - carvedilol (COREG) 25 MG tablet; Take 1 tablet (25 mg total) by mouth 2 (two) times daily with a meal.  Dispense: 180 tablet; Refill: 1 - losartan (COZAAR) 100 MG tablet; Take 1 tablet (100 mg total) by mouth daily.  Dispense: 90 tablet; Refill: 1 - spironolactone (ALDACTONE) 25 MG tablet; Take 1 tablet (25 mg total) by mouth daily.  Dispense: 90 tablet; Refill: 1  3. Statin intolerance History of myalgias with statins Atorvastatin on hold  4. Screening for colon cancer - Ambulatory referral to Gastroenterology  5. Musculoskeletal back pain Currently doing well on meloxicam Advised to apply heat Discussed stretching exercises, yoga    Meds ordered this encounter  Medications   carvedilol (COREG) 25 MG tablet    Sig: Take 1 tablet (25 mg total) by mouth 2 (two) times daily with a meal.    Dispense:  180 tablet    Refill:  1   losartan (COZAAR) 100 MG tablet    Sig: Take 1 tablet (100 mg total) by mouth daily.    Dispense:  90 tablet    Refill:  1   spironolactone (ALDACTONE) 25 MG tablet    Sig: Take 1 tablet (25 mg total) by mouth daily.    Dispense:  90 tablet    Refill:  1    Follow-up: Return in about 6 months (around 01/22/2021) for Medical conditions.       Charlott Rakes, MD, FAAFP. [facility] [place],  [place]   07/23/2020, 6:14 PM

## 2020-07-23 NOTE — Patient Instructions (Signed)
https://doi.[email or web address]">  Chronic Back Pain When back pain lasts longer than 3 months, it is called chronic back pain. The cause of your back pain may not be known. Some common causes include: Wear and tear (degenerative disease) of the bones, ligaments, or disks in your back. Inflammation and stiffness in your back (arthritis). People who have chronic back pain often go through certain periods in which the pain is more intense (flare-ups). Many people can learn to manage the pain with home care. Follow these instructions at home: Pay attention to any changes in your symptoms. Take these actions to help withyour pain: Managing pain and stiffness     If directed, apply ice to the painful area. Your health care provider may recommend applying ice during the first 24-48 hours after a flare-up begins. To do this: Put ice in a plastic bag. Place a towel between your skin and the bag. Leave the ice on for 20 minutes, 2-3 times per day. If directed, apply heat to the affected area as often as told by your health care provider. Use the heat source that your health care provider recommends, such as a moist heat pack or a heating pad. Place a towel between your skin and the heat source. Leave the heat on for 20-30 minutes. Remove the heat if your skin turns bright red. This is especially important if you are unable to feel pain, heat, or cold. You may have a greater risk of getting burned. Try soaking in a warm tub. Activity  Avoid bending and other activities that make the problem worse. Maintain a proper position when standing or sitting: When standing, keep your upper back and neck straight, with your shoulders pulled back. Avoid slouching. When sitting, keep your back straight and relax your shoulders. Do not round your shoulders or pull them backward. Do not sit or stand in one place for long periods of time. Take brief periods of rest throughout the day. This will reduce your  pain. Resting in a lying or standing position is usually better than sitting to rest. When you are resting for longer periods, mix in some mild activity or stretching between periods of rest. This will help to prevent stiffness and pain. Get regular exercise. Ask your health care provider what activities are safe for you. Do not lift anything that is heavier than 10 lb (4.5 kg), or the limit that you are told, until your health care provider says that it is safe. Always use proper lifting technique, which includes: Bending your knees. Keeping the load close to your body. Avoiding twisting. Sleep on a firm mattress in a comfortable position. Try lying on your side with your knees slightly bent. If you lie on your back, put a pillow under your knees.  Medicines Treatment may include medicines for pain and inflammation taken by mouth or applied to the skin, prescription pain medicine, or muscle relaxants. Take over-the-counter and prescription medicines only as told by your health care provider. Ask your health care provider if the medicine prescribed to you: Requires you to avoid driving or using machinery. Can cause constipation. You may need to take these actions to prevent or treat constipation: Drink enough fluid to keep your urine pale yellow. Take over-the-counter or prescription medicines. Eat foods that are high in fiber, such as beans, whole grains, and fresh fruits and vegetables. Limit foods that are high in fat and processed sugars, such as fried or sweet foods. General instructions Do not use any products that contain   nicotine or tobacco, such as cigarettes, e-cigarettes, and chewing tobacco. If you need help quitting, ask your health care provider. Keep all follow-up visits as told by your health care provider. This is important. Contact a health care provider if: You have pain that is not relieved with rest or medicine. Your pain gets worse, or you have new pain. You have a high  fever. You have rapid weight loss. You have trouble doing your normal activities. Get help right away if: You have weakness or numbness in one or both of your legs or feet. You have trouble controlling your bladder or your bowels. You have severe back pain and have any of the following: Nausea or vomiting. Pain in your abdomen. Shortness of breath or you faint. Summary Chronic back pain is back pain that lasts longer than 3 months. When a flare-up begins, apply ice to the painful area for the first 24-48 hours. Apply a moist heat pad or use a heating pad on the painful area as directed by your health care provider. When you are resting for longer periods, mix in some mild activity or stretching between periods of rest. This will help to prevent stiffness and pain. This information is not intended to replace advice given to you by your health care provider. Make sure you discuss any questions you have with your healthcare provider. Document Revised: 03/01/2019 Document Reviewed: 03/01/2019 Elsevier Patient Education  2022 Elsevier Inc.  

## 2020-07-25 ENCOUNTER — Ambulatory Visit: Payer: BC Managed Care – PPO | Attending: Family Medicine

## 2020-07-25 ENCOUNTER — Other Ambulatory Visit: Payer: Self-pay

## 2020-07-25 DIAGNOSIS — E7800 Pure hypercholesterolemia, unspecified: Secondary | ICD-10-CM

## 2020-07-26 ENCOUNTER — Other Ambulatory Visit: Payer: Self-pay | Admitting: Family Medicine

## 2020-07-26 LAB — LIPID PANEL
Chol/HDL Ratio: 3.6 ratio (ref 0.0–4.4)
Cholesterol, Total: 222 mg/dL — ABNORMAL HIGH (ref 100–199)
HDL: 62 mg/dL (ref >39)
LDL Chol Calc (NIH): 151 mg/dL — ABNORMAL HIGH (ref 0–99)
Triglycerides: 50 mg/dL (ref 0–149)
VLDL Cholesterol Cal: 9 mg/dL (ref 5–40)

## 2020-07-26 LAB — CMP14+EGFR
ALT: 12 IU/L (ref 0–32)
AST: 12 IU/L (ref 0–40)
Albumin/Globulin Ratio: 1.6 (ref 1.2–2.2)
Albumin: 4.2 g/dL (ref 3.8–4.8)
Alkaline Phosphatase: 82 IU/L (ref 44–121)
BUN/Creatinine Ratio: 12 (ref 12–28)
BUN: 11 mg/dL (ref 8–27)
Bilirubin Total: 0.2 mg/dL (ref 0.0–1.2)
CO2: 25 mmol/L (ref 20–29)
Calcium: 9.4 mg/dL (ref 8.7–10.3)
Chloride: 108 mmol/L — ABNORMAL HIGH (ref 96–106)
Creatinine, Ser: 0.91 mg/dL (ref 0.57–1.00)
Globulin, Total: 2.6 g/dL (ref 1.5–4.5)
Glucose: 97 mg/dL (ref 65–99)
Potassium: 4.7 mmol/L (ref 3.5–5.2)
Sodium: 144 mmol/L (ref 134–144)
Total Protein: 6.8 g/dL (ref 6.0–8.5)
eGFR: 70 mL/min/{1.73_m2} (ref >59)

## 2020-07-26 MED ORDER — EZETIMIBE 10 MG PO TABS: 10.0000 mg | ORAL_TABLET | Freq: Every day | ORAL | 1 refills | Status: DC

## 2020-10-17 ENCOUNTER — Ambulatory Visit: Payer: Self-pay | Admitting: *Deleted

## 2020-10-17 DIAGNOSIS — R1900 Intra-abdominal and pelvic swelling, mass and lump, unspecified site: Secondary | ICD-10-CM | POA: Diagnosis not present

## 2020-10-17 DIAGNOSIS — K7689 Other specified diseases of liver: Secondary | ICD-10-CM | POA: Diagnosis not present

## 2020-10-17 DIAGNOSIS — K5732 Diverticulitis of large intestine without perforation or abscess without bleeding: Secondary | ICD-10-CM | POA: Diagnosis not present

## 2020-10-17 DIAGNOSIS — K449 Diaphragmatic hernia without obstruction or gangrene: Secondary | ICD-10-CM | POA: Diagnosis not present

## 2020-10-17 NOTE — Telephone Encounter (Signed)
FYI noted pt states she will go to [facility].

## 2020-10-17 NOTE — Telephone Encounter (Signed)
Pt reports urinary frequency, urgency, bladder pressure and dysuria. Onset two days ago. Denies fever, no visible blood in urine. Reports urinating "Painful." No availability at practice within 24 hours. ADvised [facility], pt states will go to [facility]. Assured pt NT would route to practice for PCPs review. Pt verbalizes understanding.

## 2020-10-17 NOTE — Telephone Encounter (Signed)
Patient experience feeling pressure when urinating, possible UTI for 2 days. Patient declined appointment at this time and would like to wait for a nurse to call her back.    Reason for Disposition  Urinating more frequently than usual (i.e., frequency)  Answer Assessment - Initial Assessment Questions 1. SYMPTOM: "What's the main symptom you're concerned about?" (e.g., frequency, incontinence)     Pressure at bladder area. 2. ONSET: "When did the  *No Answer*  start?"     2 days ago 3. PAIN: "Is there any pain?" If Yes, ask: "How bad is it?" (Scale: 1-10; mild, moderate, severe)    Mild 4. CAUSE: "What do you think is causing the symptoms?"     UTI 5. OTHER SYMPTOMS: "Do you have any other symptoms?" (e.g., fever, flank pain, blood in urine, pain with urination)     Frequency, urgency, bladder pressure  Protocols used: Urinary Symptoms-A-AH

## 2020-10-18 ENCOUNTER — Telehealth: Payer: Self-pay

## 2020-10-18 NOTE — Telephone Encounter (Signed)
Transition Care Management Unsuccessful Follow-up Telephone Call  Date of discharge and from where:  10/17/2020-[facility]   Attempts:  1st Attempt  Reason for unsuccessful TCM follow-up call:  Left voice message

## 2020-10-19 NOTE — Telephone Encounter (Signed)
Transition Care Management Follow-up Telephone Call Date of discharge and from where: 10/17/2020 from [facility] How have you been since you were released from the hospital? Pt stated that she is feeling some better.  Any questions or concerns? No  Items Reviewed: Did the pt receive and understand the discharge instructions provided? Yes  Medications obtained and verified? Yes  Other? No  Any new allergies since your discharge? No  Dietary orders reviewed? No Do you have support at home? Yes   Functional Questionnaire: (I = Independent and D = Dependent) ADLs: I  Bathing/Dressing- I  Meal Prep- I  Eating- I  Maintaining continence- I  Transferring/Ambulation- I  Managing Meds- I   Follow up appointments reviewed:  PCP Hospital f/u appt confirmed? Yes  Scheduled to see Charlott Rakes, MD on 11/13/2020 @ 2:10pm. [facility] f/u appt confirmed? No   Are transportation arrangements needed? No  If their condition worsens, is the pt aware to call PCP or go to the Emergency Dept.? Yes Was the patient provided with contact information for the PCP's office or ED? Yes Was to pt encouraged to call back with questions or concerns? Yes

## 2020-11-13 ENCOUNTER — Other Ambulatory Visit: Payer: Self-pay

## 2020-11-13 ENCOUNTER — Encounter: Payer: Self-pay | Admitting: Family Medicine

## 2020-11-13 ENCOUNTER — Ambulatory Visit: Payer: Medicaid Other | Attending: Family Medicine | Admitting: Family Medicine

## 2020-11-13 VITALS — BP 134/69 | HR 84 | Ht 68.0 in | Wt 219.6 lb

## 2020-11-13 DIAGNOSIS — I10 Essential (primary) hypertension: Secondary | ICD-10-CM | POA: Insufficient documentation

## 2020-11-13 DIAGNOSIS — Z791 Long term (current) use of non-steroidal anti-inflammatories (NSAID): Secondary | ICD-10-CM | POA: Diagnosis not present

## 2020-11-13 DIAGNOSIS — Z79899 Other long term (current) drug therapy: Secondary | ICD-10-CM | POA: Diagnosis not present

## 2020-11-13 DIAGNOSIS — E7800 Pure hypercholesterolemia, unspecified: Secondary | ICD-10-CM | POA: Insufficient documentation

## 2020-11-13 DIAGNOSIS — Z789 Other specified health status: Secondary | ICD-10-CM

## 2020-11-13 DIAGNOSIS — Z882 Allergy status to sulfonamides status: Secondary | ICD-10-CM | POA: Insufficient documentation

## 2020-11-13 DIAGNOSIS — D573 Sickle-cell trait: Secondary | ICD-10-CM | POA: Insufficient documentation

## 2020-11-13 DIAGNOSIS — M170 Bilateral primary osteoarthritis of knee: Secondary | ICD-10-CM | POA: Diagnosis not present

## 2020-11-13 MED ORDER — EZETIMIBE 10 MG PO TABS: 10.0000 mg | ORAL_TABLET | Freq: Every day | ORAL | 1 refills | Status: DC

## 2020-11-13 MED ORDER — CARVEDILOL 25 MG PO TABS: 25.0000 mg | ORAL_TABLET | Freq: Two times a day (BID) | ORAL | 1 refills | Status: DC

## 2020-11-13 MED ORDER — TRAMADOL HCL 50 MG PO TABS: 50.0000 mg | ORAL_TABLET | Freq: Every day | ORAL | 1 refills | Status: DC | PRN

## 2020-11-13 MED ORDER — LOSARTAN POTASSIUM 100 MG PO TABS
100.0000 mg | ORAL_TABLET | Freq: Every day | ORAL | 1 refills | Status: DC
Start: 2020-11-13 — End: 2021-05-21

## 2020-11-13 NOTE — Patient Instructions (Signed)
[facility] [address] [place], [place] [address]  pH# [phone]

## 2020-11-13 NOTE — Progress Notes (Signed)
Subjective:  Patient ID: Kaylee Nelson, female    DOB: [birth year]  Age: 65 y.o. MRN: [id number]  CC: Hospitalization Follow-up   HPI Kaylee Nelson is a 65 y.o. 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    Interval History: Seen at [place] [facility] 1 month ago for diverticulitis and placed on antibiotics and she feels much better now. She complains of her knees hurting especially the posterior aspect of her left knee.  Her knee sometimes buckles especially when she is going downstairs.  Denies presence of swelling. Pain is 6/10 and pain worse first thing in the morning. Knee x-rays from 06/2020 revealed: EXAM: LEFT KNEE - COMPLETE 4+ VIEW   COMPARISON:  None.   FINDINGS: Mild medial tibiofemoral joint space narrowing. Moderate tricompartmental peripheral spurring. There is spurring of the tibial spines. No significant joint effusion. No erosion, bony destruction or focal bone abnormality. No focal soft tissue abnormality.   IMPRESSION: Moderate tricompartmental osteoarthritis.     EXAM: RIGHT KNEE - COMPLETE 4+ VIEW   COMPARISON:  None.   FINDINGS: Mild medial tibiofemoral joint space narrowing. Moderate tricompartmental peripheral spurs. No significant joint effusion. No erosion, bony destruction or focal lesion. No focal soft tissue abnormality is seen.   IMPRESSION: Moderate tricompartmental osteoarthritis.     Doing well on her antihypertensive. She has been unable to tolerate a statin due to myalgias and is on Zetia.  Past Medical History:  Diagnosis Date   Heart murmur    as a child   Hypertension    Phlegm in throat    LAST WEEK OR TWO NO FEVER   Sickle cell trait (HCC)     Past Surgical History:  Procedure Laterality Date   ABDOMINAL HYSTERECTOMY     ADRENALECTOMY N/A 08/18/2018   Procedure: OPEN LEFT  ADRENALECTOMY;  Surgeon: Armandina Gemma, MD;  Location: [facility];  Service: General;  Laterality: N/A;   CESAREAN SECTION      Family History  Problem Relation Age of Onset   Cancer Mother    Breast cancer Mother    Cancer Maternal Aunt    Breast cancer Maternal Aunt    Cancer Daughter    Breast cancer Daughter    BRCA 1/2 Cousin     Allergies  Allergen Reactions   Shrimp [Shellfish Allergy]    Sulfa Antibiotics     Outpatient Medications Prior to Visit  Medication Sig Dispense Refill   meloxicam (MOBIC) 15 MG tablet Take 0.5-1 tablets (7.5-15 mg total) by mouth daily as needed for pain. 30 tablet 6   spironolactone (ALDACTONE) 25 MG tablet Take 1 tablet (25 mg total) by mouth daily. 90 tablet 1   atorvastatin (LIPITOR) 40 MG tablet Take 1 tablet (40 mg total) by mouth daily. 90 tablet 1   carvedilol (COREG) 25 MG tablet Take 1 tablet (25 mg total) by mouth 2 (two) times daily with a meal. 180 tablet 1   ezetimibe (ZETIA) 10 MG tablet Take 1 tablet (10 mg total) by mouth daily. 90 tablet 1   losartan (COZAAR) 100 MG tablet Take 1 tablet (100 mg total) by mouth daily. 90 tablet 1   naproxen (NAPROSYN) 500 MG tablet TAKE 1 TABLET BY MOUTH TWICE DAILY WITH A MEAL 60 tablet 3   No facility-administered medications prior to visit.     ROS Review of Systems  Constitutional:  Negative for  activity change, appetite change and fatigue.  HENT:  Negative for congestion, sinus pressure and sore throat.   Eyes:  Negative for visual disturbance.  Respiratory:  Negative for cough, chest tightness, shortness of breath and wheezing.   Cardiovascular:  Negative for chest pain and palpitations.  Gastrointestinal:  Negative for abdominal distention, abdominal pain and constipation.  Endocrine: Negative for polydipsia.  Genitourinary:  Negative for dysuria and frequency.  Musculoskeletal:  Negative for arthralgias and back pain.  Skin:  Negative for rash.  Neurological:  Negative for tremors,  light-headedness and numbness.  Hematological:  Does not bruise/bleed easily.  Psychiatric/Behavioral:  Negative for agitation and behavioral problems.    Objective:  BP 134/69   Pulse 84   Ht _0  (1.727 m)   Wt 219 lb 9.6 oz (99.6 kg)   SpO2 100%   BMI 33.39 kg/m   BP/Weight 11/13/2020 09/28/784 08/07/4490  Systolic BP 010 071 219  Diastolic BP 69 73 93  Wt. (Lbs) 219.6 216 208  BMI 33.39 32.84 31.63      Physical Exam Constitutional:      Appearance: She is well-developed.  Cardiovascular:     Rate and Rhythm: Normal rate.     Heart sounds: Normal heart sounds. No murmur heard. Pulmonary:     Effort: Pulmonary effort is normal.     Breath sounds: Normal breath sounds. No wheezing or rales.  Chest:     Chest wall: No tenderness.  Abdominal:     General: Bowel sounds are normal. There is no distension.     Palpations: Abdomen is soft. There is no mass.     Tenderness: There is no abdominal tenderness.     Comments: Healed vertical surgical scar  Musculoskeletal:        General: Normal range of motion.     Right lower leg: No edema.     Left lower leg: No edema.     Comments: Crepitus on range of motion of both knees Slight edema of left popliteal fossa Tenderness on palpation of left popliteal fossa  Neurological:     Mental Status: She is alert and oriented to person, place, and time.  Psychiatric:        Mood and Affect: Mood normal.    CMP Latest Ref Rng & Units 07/25/2020 10/31/2019 12/26/2018  Glucose 65 - 99 mg/dL 97 99 69  BUN 8 - 27 mg/dL _1 Creatinine 0.57 - 1.00 mg/dL 0.91 0.76 0.79  Sodium 134 - 144 mmol/L 144 144 145(H)  Potassium 3.5 - 5.2 mmol/L 4.7 3.9 4.5  Chloride 96 - 106 mmol/L 108(H) 107(H) 110(H)  CO2 20 - 29 mmol/L _2 Calcium 8.7 - 10.3 mg/dL 9.4 9.4 9.4  Total Protein 6.0 - 8.5 g/dL 6.8 7.0 6.7  Total Bilirubin 0.0 - 1.2 mg/dL 0.2 0.4 0.3  Alkaline Phos 44 - 121 IU/L 82 103 91  AST 0 - 40 IU/L _3 ALT 0 - 32 IU/L  _4 Lipid Panel     Component Value Date/Time   CHOL 222 (H) 07/25/2020 0909   TRIG 50 07/25/2020 0909   HDL 62 07/25/2020 0909   CHOLHDL 3.6 07/25/2020 0909   CHOLHDL 2.8 12/17/2015 1035   VLDL 10 12/17/2015 1035   LDLCALC 151 (H) 07/25/2020 0909    CBC    Component Value Date/Time   WBC 4.3 12/26/2018 1551   WBC 9.7 08/23/2018 1255  RBC 4.36 12/26/2018 1551   RBC 3.59 (L) 08/23/2018 1255   HGB 11.7 12/26/2018 1551   HCT 34.8 12/26/2018 1551   PLT 209 12/26/2018 1551   MCV 80 12/26/2018 1551   MCH 26.8 12/26/2018 1551   MCH 28.7 08/23/2018 1255   MCHC 33.6 12/26/2018 1551   MCHC 32.2 08/23/2018 1255   RDW 14.0 12/26/2018 1551   LYMPHSABS 1.2 12/26/2018 1551   MONOABS 1.2 (H) 08/20/2018 0755   EOSABS 0.1 12/26/2018 1551   BASOSABS 0.0 12/26/2018 1551    No results found for: HGBA1C   The 10-year ASCVD risk score (Arnett DK, et al., 2019) is: 10.2%   Values used to calculate the score:     Age: 42 years     Sex: Female     Is Non-Hispanic African American: Yes     Diabetic: No     Tobacco smoker: No     Systolic Blood Pressure: 366 mmHg     Is BP treated: Yes     HDL Cholesterol: 62 mg/dL     Total Cholesterol: 222 mg/dL  Assessment & Plan:  1. Essential hypertension Controlled Counseled on blood pressure goal of less than 130/80, low-sodium, DASH diet, medication compliance, 150 minutes of moderate intensity exercise per week. Discussed medication compliance, adverse effects. - carvedilol (COREG) 25 MG tablet; Take 1 tablet (25 mg total) by mouth 2 (two) times daily with a meal.  Dispense: 180 tablet; Refill: 1 - losartan (COZAAR) 100 MG tablet; Take 1 tablet (100 mg total) by mouth daily.  Dispense: 90 tablet; Refill: 1  2. Pure hypercholesterolemia Uncontrolled Unable to tolerate statin Continue Zetia Low-cholesterol diet  3. Statin intolerance See #2 above  4. Bilateral primary osteoarthritis of knee Currently on meloxicam which has  been ineffective - AMB referral to orthopedics    Meds ordered this encounter  Medications   carvedilol (COREG) 25 MG tablet    Sig: Take 1 tablet (25 mg total) by mouth 2 (two) times daily with a meal.    Dispense:  180 tablet    Refill:  1   ezetimibe (ZETIA) 10 MG tablet    Sig: Take 1 tablet (10 mg total) by mouth daily.    Dispense:  90 tablet    Refill:  1   losartan (COZAAR) 100 MG tablet    Sig: Take 1 tablet (100 mg total) by mouth daily.    Dispense:  90 tablet    Refill:  1   traMADol (ULTRAM) 50 MG tablet    Sig: Take 1 tablet (50 mg total) by mouth daily as needed for up to 5 days.    Dispense:  30 tablet    Refill:  1    Follow-up: Return in about 6 months (around 05/14/2021) for Medical conditions.       Charlott Rakes, MD, FAAFP. [facility] [place], [place]   11/13/2020, 5:55 PM

## 2020-11-13 NOTE — Progress Notes (Signed)
Has sharp pain in knees at times

## 2020-11-15 ENCOUNTER — Telehealth: Payer: Self-pay

## 2020-11-15 MED ORDER — TRAMADOL HCL 50 MG PO TABS: 50.0000 mg | ORAL_TABLET | Freq: Every day | ORAL | 1 refills | Status: DC | PRN

## 2020-11-15 NOTE — Telephone Encounter (Signed)
Done

## 2020-11-15 NOTE — Telephone Encounter (Signed)
Tramadol PA approved until 05/13/21. Pharmacy notified.

## 2020-11-15 NOTE — Addendum Note (Signed)
Addended by: Charlott Rakes on: 11/15/2020 03:50 PM   Modules accepted: Orders

## 2020-11-15 NOTE — Telephone Encounter (Signed)
traMADol (ULTRAM) 50 MG tablet 30 tablet 1 11/13/2020 11/18/2020   Sig - Route: Take 1 tablet (50 mg total) by mouth daily as needed for up to 5 days. - Oral   Sent to pharmacy as: traMADol (ULTRAM) 50 MG tablet   E-Prescribing Status: Receipt confirmed by pharmacy (11/13/2020  2:41 PM EDT    [place], [place] - [place]  [address] [place] [address]  Phone: [phone] Fax: [phone]  Linna Hoff) At [facility] states needs a cb from [place] as script thinks written incorrectly states by mouth up to 5 days 30 tablets FU up as soon as possible [phone]  Script needs to be changed and re sent.

## 2020-12-04 ENCOUNTER — Ambulatory Visit: Payer: Medicaid Other | Admitting: Orthopedic Surgery

## 2020-12-25 ENCOUNTER — Ambulatory Visit: Payer: Medicaid Other | Admitting: Orthopedic Surgery

## 2020-12-31 ENCOUNTER — Other Ambulatory Visit: Payer: Self-pay | Admitting: Family Medicine

## 2020-12-31 DIAGNOSIS — M5441 Lumbago with sciatica, right side: Secondary | ICD-10-CM

## 2021-01-02 NOTE — Telephone Encounter (Signed)
Requested medications are due for refill today.  no  Requested medications are on the active medications list.  no  Last refill. 03/08/2019  Future visit scheduled.   yes  Notes to clinic.  Medication was discontinued by Dr. Margarita Rana 11/13/2020.

## 2021-01-22 ENCOUNTER — Ambulatory Visit: Payer: BC Managed Care – PPO | Admitting: Family Medicine

## 2021-03-10 ENCOUNTER — Other Ambulatory Visit: Payer: Self-pay | Admitting: Family Medicine

## 2021-03-10 DIAGNOSIS — I10 Essential (primary) hypertension: Secondary | ICD-10-CM

## 2021-03-11 NOTE — Telephone Encounter (Signed)
Requested medication (s) are due for refill today - yes  Requested medication (s) are on the active medication list -yes  Future visit scheduled -yes  Last refill: 07/04/20 #90 1RF  Notes to clinic: Request RF: fails lab protocol- fails 180 days  Requested Prescriptions  Pending Prescriptions Disp Refills   spironolactone (ALDACTONE) 25 MG tablet [Pharmacy Med Name: Spironolactone 25 MG Oral Tablet] 90 tablet 0    Sig: Take 1 tablet by mouth once daily     Cardiovascular: Diuretics - Aldosterone Antagonist Failed - 03/10/2021  8:55 PM      Failed - Cr in normal range and within 180 days    Creat  Date Value Ref Range Status  03/27/2016 0.70 0.50 - 1.05 mg/dL Final    Comment:      For patients > or = 65 years of age: The upper reference limit for Creatinine is approximately 13% higher for people identified as African-American.      Creatinine, Ser  Date Value Ref Range Status  07/25/2020 0.91 0.57 - 1.00 mg/dL Final          Failed - K in normal range and within 180 days    Potassium  Date Value Ref Range Status  07/25/2020 4.7 3.5 - 5.2 mmol/L Final          Failed - Na in normal range and within 180 days    Sodium  Date Value Ref Range Status  07/25/2020 144 134 - 144 mmol/L Final          Failed - eGFR is 30 or above and within 180 days    GFR, Est African American  Date Value Ref Range Status  12/17/2015 >89 >=60 mL/min Final   GFR calc Af Amer  Date Value Ref Range Status  10/31/2019 97 >59 mL/min/1.73 Final    Comment:    **[facility] currently reports eGFR in compliance with the current**   recommendations of the [employer]. [facility] will   update reporting as new guidelines are published from the NKF-ASN   Task force.    GFR, Est Non African American  Date Value Ref Range Status  12/17/2015 >89 >=60 mL/min Final   GFR calc non Af Amer  Date Value Ref Range Status  10/31/2019 84 >59 mL/min/1.73 Final   eGFR  Date Value Ref Range  Status  07/25/2020 70 >59 mL/min/1.73 Final          Passed - Last BP in normal range    BP Readings from Last 1 Encounters:  11/13/20 134/69          Passed - Valid encounter within last 6 months    Recent Outpatient Visits           3 months ago Pure hypercholesterolemia   [place], Charlane Ferretti, MD   7 months ago Statin intolerance   [facility] [place], Charlane Ferretti, MD   1 year ago Screening for colon cancer   [place], Charlane Ferretti, MD   2 years ago Acute right-sided low back pain with right-sided sciatica   [place], Enobong, MD   2 years ago Retroperitoneal mass   [place], Enobong, MD       Future Appointments             In 2 months Charlott Rakes, MD [place]  Requested Prescriptions  Pending Prescriptions Disp Refills   spironolactone (ALDACTONE) 25 MG tablet [Pharmacy Med Name: Spironolactone 25 MG Oral Tablet] 90 tablet 0    Sig: Take 1 tablet by mouth once daily     Cardiovascular: Diuretics - Aldosterone Antagonist Failed - 03/10/2021  8:55 PM      Failed - Cr in normal range and within 180 days    Creat  Date Value Ref Range Status  03/27/2016 0.70 0.50 - 1.05 mg/dL Final    Comment:      For patients > or = 65 years of age: The upper reference limit for Creatinine is approximately 13% higher for people identified as African-American.      Creatinine, Ser  Date Value Ref Range Status  07/25/2020 0.91 0.57 - 1.00 mg/dL Final          Failed - K in normal range and within 180 days    Potassium  Date Value Ref Range Status  07/25/2020 4.7 3.5 - 5.2 mmol/L Final          Failed - Na in normal range and within 180 days    Sodium  Date Value Ref Range Status  07/25/2020 144 134 - 144 mmol/L Final          Failed  - eGFR is 30 or above and within 180 days    GFR, Est African American  Date Value Ref Range Status  12/17/2015 >89 >=60 mL/min Final   GFR calc Af Amer  Date Value Ref Range Status  10/31/2019 97 >59 mL/min/1.73 Final    Comment:    **[facility] currently reports eGFR in compliance with the current**   recommendations of the [employer]. [facility] will   update reporting as new guidelines are published from the NKF-ASN   Task force.    GFR, Est Non African American  Date Value Ref Range Status  12/17/2015 >89 >=60 mL/min Final   GFR calc non Af Amer  Date Value Ref Range Status  10/31/2019 84 >59 mL/min/1.73 Final   eGFR  Date Value Ref Range Status  07/25/2020 70 >59 mL/min/1.73 Final          Passed - Last BP in normal range    BP Readings from Last 1 Encounters:  11/13/20 134/69          Passed - Valid encounter within last 6 months    Recent Outpatient Visits           3 months ago Pure hypercholesterolemia   [place], Charlane Ferretti, MD   7 months ago Statin intolerance   [facility] [place], Charlane Ferretti, MD   1 year ago Screening for colon cancer   [place], Charlane Ferretti, MD   2 years ago Acute right-sided low back pain with right-sided sciatica   [place], Enobong, MD   2 years ago Retroperitoneal mass   [place], Enobong, MD       Future Appointments             In 2 months Charlott Rakes, MD [place]

## 2021-04-02 ENCOUNTER — Telehealth: Payer: Self-pay

## 2021-04-02 NOTE — Telephone Encounter (Signed)
Patient called wanting a referral to a gastroenterologist for a colonoscopy and to have a mammogram done.  ?

## 2021-04-03 ENCOUNTER — Other Ambulatory Visit: Payer: Self-pay

## 2021-04-03 DIAGNOSIS — Z1231 Encounter for screening mammogram for malignant neoplasm of breast: Secondary | ICD-10-CM

## 2021-04-03 NOTE — Telephone Encounter (Signed)
Pt was called and a VM was left informing patient to return phone call. ?

## 2021-05-07 DIAGNOSIS — C499 Malignant neoplasm of connective and soft tissue, unspecified: Secondary | ICD-10-CM | POA: Diagnosis not present

## 2021-05-07 DIAGNOSIS — K760 Fatty (change of) liver, not elsewhere classified: Secondary | ICD-10-CM | POA: Diagnosis not present

## 2021-05-07 DIAGNOSIS — K7689 Other specified diseases of liver: Secondary | ICD-10-CM | POA: Diagnosis not present

## 2021-05-15 ENCOUNTER — Ambulatory Visit: Payer: Medicaid Other | Admitting: Family Medicine

## 2021-05-19 ENCOUNTER — Ambulatory Visit
Admission: RE | Admit: 2021-05-19 | Discharge: 2021-05-19 | Disposition: A | Discharge status: Home / Self Care | Payer: Medicaid Other | Source: Ambulatory Visit | Attending: Family Medicine | Admitting: Family Medicine

## 2021-05-19 DIAGNOSIS — Z1231 Encounter for screening mammogram for malignant neoplasm of breast: Secondary | ICD-10-CM

## 2021-05-20 ENCOUNTER — Ambulatory Visit: Payer: Self-pay

## 2021-05-20 NOTE — Telephone Encounter (Signed)
pt states she received some disturbing news.  Would like to talk to Dr Margarita Rana prior to her appt tomorrow.  Pt would like call back to discuss prior to her appt tomorrow w/ Dr Margarita Rana.  Declined to give any other information to me.   ?  ?Pt. Asking to speak with Dr. Margarita Rana about mammogram results. Please advise. ?Answer Assessment - Initial Assessment Questions ?1. REASON FOR CALL or QUESTION: "What is your reason for calling today?" or "How can I best ?help you?" or "What question do you have that I can help answer?" ?    Pt. Is concerned about mammogram results which show possible mass in breast. Asking to speak with PCP. ?2. CALLER: Document the source of call. (e.g., laboratory, patient). ?    patient ? ?Protocols used: PCP Call - No Triage-A-AH ? ?

## 2021-05-21 ENCOUNTER — Encounter: Payer: Self-pay | Admitting: Family Medicine

## 2021-05-21 ENCOUNTER — Ambulatory Visit: Payer: Medicaid Other | Attending: Family Medicine | Admitting: Family Medicine

## 2021-05-21 ENCOUNTER — Other Ambulatory Visit: Payer: Self-pay | Admitting: Family Medicine

## 2021-05-21 VITALS — BP 121/71 | HR 79 | Ht 68.0 in | Wt 217.2 lb

## 2021-05-21 DIAGNOSIS — E7800 Pure hypercholesterolemia, unspecified: Secondary | ICD-10-CM

## 2021-05-21 DIAGNOSIS — E2839 Other primary ovarian failure: Secondary | ICD-10-CM

## 2021-05-21 DIAGNOSIS — R252 Cramp and spasm: Secondary | ICD-10-CM

## 2021-05-21 DIAGNOSIS — Z1211 Encounter for screening for malignant neoplasm of colon: Secondary | ICD-10-CM

## 2021-05-21 DIAGNOSIS — I10 Essential (primary) hypertension: Secondary | ICD-10-CM | POA: Diagnosis not present

## 2021-05-21 DIAGNOSIS — Z131 Encounter for screening for diabetes mellitus: Secondary | ICD-10-CM

## 2021-05-21 DIAGNOSIS — Z23 Encounter for immunization: Secondary | ICD-10-CM | POA: Diagnosis not present

## 2021-05-21 DIAGNOSIS — R928 Other abnormal and inconclusive findings on diagnostic imaging of breast: Secondary | ICD-10-CM

## 2021-05-21 MED ORDER — CARVEDILOL 25 MG PO TABS: 25.0000 mg | ORAL_TABLET | Freq: Two times a day (BID) | ORAL | 1 refills | Status: DC

## 2021-05-21 MED ORDER — SPIRONOLACTONE 25 MG PO TABS: 25.0000 mg | ORAL_TABLET | Freq: Every day | ORAL | 1 refills | Status: DC

## 2021-05-21 MED ORDER — LOSARTAN POTASSIUM 100 MG PO TABS: 100.0000 mg | ORAL_TABLET | Freq: Every day | ORAL | 1 refills | Status: DC

## 2021-05-21 MED ORDER — EZETIMIBE 10 MG PO TABS: 10.0000 mg | ORAL_TABLET | Freq: Every day | ORAL | 1 refills | Status: DC

## 2021-05-21 NOTE — Progress Notes (Signed)
? ?Subjective:  ?Patient ID: Kaylee Nelson, female    DOB: Oct 28, 1955  Age: 66 y.o. MRN: [id number] ? ?CC: Hypertension ? ? ?HPI ?Kaylee Nelson is a 66 y.o. 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 ?Interval History: ?She had a visit at [place] clinic 2 weeks ago and per notes no evidence of recurrence with recommendation to follow-up in 6 months. ? ?She would like to discuss her mammogram report from yesterday: ?IMPRESSION: ?Further evaluation is suggested for a possible mass in the left ?breast. ?  ?RECOMMENDATION: ?Diagnostic mammogram and possibly ultrasound of the left breast. ?(Code:[id number]-L-69M) ?  ?The patient will be contacted regarding the findings, and additional ?imaging will be scheduled. ?  ?BI-RADS CATEGORY  0: Incomplete. Need additional imaging evaluation ?and/or prior mammograms for comparison. ?  ? ?Diagnostic mammogram and ultrasounds have been ordered by the breast center but yet to be scheduled.  I have discussed this findings with her. ? ?She is doing well on her antihypertensive and for her cholesterol she is on Zetia as she was unable to tolerate statin due to myalgias.  She has however noticed cramps in her legs at night when she attempts to stretch her legs but this does not occur when she does not stretch. ?Her knees are doing better and she only hurts on rainy days for which she uses her meloxicam. ?Past Medical History:  ?Diagnosis Date  ? Heart murmur   ? as a child  ? Hypertension   ? Phlegm in throat   ? LAST WEEK OR TWO NO FEVER  ? Sickle cell trait ([place])   ? ? ?Past Surgical History:  ?Procedure Laterality Date  ? ABDOMINAL HYSTERECTOMY    ? ADRENALECTOMY N/A 08/18/2018  ? Procedure: OPEN LEFT ADRENALECTOMY;  Surgeon: Armandina Gemma, MD;  Location: [facility];  Service: General;  Laterality: N/A;  ? CESAREAN SECTION    ? ? ?Family History  ?Problem Relation Age of  Onset  ? Cancer Mother   ? Breast cancer Mother   ? Cancer Maternal Aunt   ? Breast cancer Maternal Aunt   ? Cancer Daughter   ? Breast cancer Daughter   ? BRCA 1/2 Cousin   ? ? ?Social History  ? ?Socioeconomic History  ? Marital status: Single  ?  Spouse name: Not on file  ? Number of children: Not on file  ? Years of education: Not on file  ? Highest education level: Not on file  ?Occupational History  ? Not on file  ?Tobacco Use  ? Smoking status: Never  ? Smokeless tobacco: Never  ?Vaping Use  ? Vaping Use: Never used  ?Substance and Sexual Activity  ? Alcohol use: No  ?  Alcohol/week: 0.0 standard drinks  ? Drug use: No  ? Sexual activity: Never  ?Other Topics Concern  ? Not on file  ?Social History Narrative  ? Not on file  ? ?Social Determinants of Health  ? ?Financial Resource Strain: Not on file  ?Food Insecurity: Not on file  ?Transportation Needs: Not on file  ?Physical Activity: Not on file  ?Stress: Not on file  ?Social Connections: Not on file  ? ? ?Allergies  ?Allergen Reactions  ? Shrimp [Shellfish Allergy]   ? Sulfa Antibiotics   ? ? ?Outpatient Medications Prior to Visit  ?Medication Sig Dispense Refill  ? carvedilol (COREG) 25 MG tablet Take 1 tablet (25 mg  total) by mouth 2 (two) times daily with a meal. 180 tablet 1  ? ezetimibe (ZETIA) 10 MG tablet Take 1 tablet (10 mg total) by mouth daily. 90 tablet 1  ? losartan (COZAAR) 100 MG tablet Take 1 tablet (100 mg total) by mouth daily. 90 tablet 1  ? meloxicam (MOBIC) 15 MG tablet Take 0.5-1 tablets (7.5-15 mg total) by mouth daily as needed for pain. 30 tablet 6  ? spironolactone (ALDACTONE) 25 MG tablet Take 1 tablet by mouth once daily 90 tablet 0  ? traMADol (ULTRAM) 50 MG tablet Take 1 tablet (50 mg total) by mouth daily as needed. 30 tablet 1  ? ?No facility-administered medications prior to visit.  ? ? ? ?ROS ?Review of Systems  ?Constitutional:  Negative for activity change and appetite change.  ?HENT:  Negative for sinus pressure and sore  throat.   ?Respiratory:  Negative for chest tightness, shortness of breath and wheezing.   ?Cardiovascular:  Negative for chest pain and palpitations.  ?Gastrointestinal:  Negative for abdominal distention, abdominal pain and constipation.  ?Genitourinary: Negative.   ?Musculoskeletal:   ?     See HPI  ?Psychiatric/Behavioral:  Negative for behavioral problems and dysphoric mood.   ? ?Objective:  ?BP 121/71   Pulse 79   Ht '5\' 8"'  (1.727 m)   Wt 217 lb 3.2 oz (98.5 kg)   SpO2 99%   BMI 33.03 kg/m?  ? ? ?  05/21/2021  ? 11:30 AM 11/13/2020  ?  2:01 PM 07/23/2020  ?  3:11 PM  ?BP/Weight  ?Systolic BP 102 585 277  ?Diastolic BP 71 69 73  ?Wt. (Lbs) 217.2 219.6 216  ?BMI 33.03 kg/m2 33.39 kg/m2 32.84 kg/m2  ? ? ? ? ?Physical Exam ?Constitutional:   ?   Appearance: She is well-developed.  ?Cardiovascular:  ?   Rate and Rhythm: Normal rate.  ?   Heart sounds: Normal heart sounds. No murmur heard. ?Pulmonary:  ?   Effort: Pulmonary effort is normal.  ?   Breath sounds: Normal breath sounds. No wheezing or rales.  ?Chest:  ?   Chest wall: No tenderness.  ?Abdominal:  ?   General: Bowel sounds are normal. There is no distension.  ?   Palpations: Abdomen is soft. There is no mass.  ?   Tenderness: There is no abdominal tenderness.  ?Musculoskeletal:     ?   General: Normal range of motion.  ?   Right lower leg: No edema.  ?   Left lower leg: No edema.  ?Neurological:  ?   Mental Status: She is alert and oriented to person, place, and time.  ?Psychiatric:     ?   Mood and Affect: Mood normal.  ? ? ? ?  Latest Ref Rng & Units 07/25/2020  ?  9:09 AM 10/31/2019  ? 10:53 AM 12/26/2018  ?  3:51 PM  ?CMP  ?Glucose 65 - 99 mg/dL 97   99   69    ?BUN 8 - 27 mg/dL '11   8   13    ' ?Creatinine 0.57 - 1.00 mg/dL 0.91   0.76   0.79    ?Sodium 134 - 144 mmol/L 144   144   145    ?Potassium 3.5 - 5.2 mmol/L 4.7   3.9   4.5    ?Chloride 96 - 106 mmol/L 108   107   110    ?CO2 20 - 29 mmol/L 25   25  27    ?Calcium 8.7 - 10.3 mg/dL 9.4   9.4    9.4    ?Total Protein 6.0 - 8.5 g/dL 6.8   7.0   6.7    ?Total Bilirubin 0.0 - 1.2 mg/dL 0.2   0.4   0.3    ?Alkaline Phos 44 - 121 IU/L 82   103   91    ?AST 0 - 40 IU/L '12   17   19    ' ?ALT 0 - 32 IU/L '12   12   15    ' ? ? ?Lipid Panel  ?   ?Component Value Date/Time  ? CHOL 222 (H) 07/25/2020 4540  ? TRIG 50 07/25/2020 0909  ? HDL 62 07/25/2020 0909  ? CHOLHDL 3.6 07/25/2020 0909  ? CHOLHDL 2.8 12/17/2015 1035  ? VLDL 10 12/17/2015 1035  ? LDLCALC 151 (H) 07/25/2020 0909  ? ? ?CBC ?   ?Component Value Date/Time  ? WBC 4.3 12/26/2018 1551  ? WBC 9.7 08/23/2018 1255  ? RBC 4.36 12/26/2018 1551  ? RBC 3.59 (L) 08/23/2018 1255  ? HGB 11.7 12/26/2018 1551  ? HCT 34.8 12/26/2018 1551  ? PLT 209 12/26/2018 1551  ? MCV 80 12/26/2018 1551  ? MCH 26.8 12/26/2018 1551  ? MCH 28.7 08/23/2018 1255  ? MCHC 33.6 12/26/2018 1551  ? MCHC 32.2 08/23/2018 1255  ? RDW 14.0 12/26/2018 1551  ? LYMPHSABS 1.2 12/26/2018 1551  ? MONOABS 1.2 (H) 08/20/2018 0755  ? EOSABS 0.1 12/26/2018 1551  ? BASOSABS 0.0 12/26/2018 1551  ? ? ?No results found for: HGBA1C ? ?Assessment & Plan:  ?1. Estrogen deficiency ?- DG Bone Density; Future ? ?2. Screening for colon cancer ?Referred for colonoscopy last year but GI attempted to contact the patient but was unsuccessful ?She has been referred again today and I have provided her with the number to GI to contact them for an appointment ? ?3. Screening for diabetes mellitus ?- Hemoglobin A1c ? ?4. Essential hypertension ?Controlled ?Counseled on blood pressure goal of less than 130/80, low-sodium, DASH diet, medication compliance, 150 minutes of moderate intensity exercise per week. ?Discussed medication compliance, adverse effects. ?- CMP14+EGFR ?- CBC with Differential/Platelet ? ?5. Pure hypercholesterolemia ?Uncontrolled ?Due to statin intolerance she is currently on Zetia ?- LP+Non-HDL Cholesterol ? ?6. Muscle cramps ?Previously on statin which was discontinued  ?Advised to stay hydrated, wear socks  to keep feet warm ?We will check magnesium level ?If symptoms persist consider muscle relaxant ?- Magnesium ? ?7. Need for pneumococcal vaccine ?- Pneumococcal conjugate vaccine 20-valent ? ?8. Need for sh

## 2021-05-21 NOTE — Telephone Encounter (Signed)
Concerns addressed today with PCP ?

## 2021-05-21 NOTE — Progress Notes (Signed)
Discuss mammogram results. ?

## 2021-05-21 NOTE — Patient Instructions (Addendum)
Please call to schedule your colonoscopy:  ?[facility] [address] [place], [place] [address]  ?pH# [phone] ?

## 2021-05-22 LAB — CMP14+EGFR
ALT: 17 IU/L (ref 0–32)
AST: 21 IU/L (ref 0–40)
Albumin/Globulin Ratio: 1.4 (ref 1.2–2.2)
Albumin: 4.2 g/dL (ref 3.8–4.8)
Alkaline Phosphatase: 102 IU/L (ref 44–121)
BUN/Creatinine Ratio: 7 — ABNORMAL LOW (ref 12–28)
BUN: 7 mg/dL — ABNORMAL LOW (ref 8–27)
Bilirubin Total: 0.4 mg/dL (ref 0.0–1.2)
CO2: 22 mmol/L (ref 20–29)
Calcium: 9.7 mg/dL (ref 8.7–10.3)
Chloride: 108 mmol/L — ABNORMAL HIGH (ref 96–106)
Creatinine, Ser: 0.95 mg/dL (ref 0.57–1.00)
Globulin, Total: 2.9 g/dL (ref 1.5–4.5)
Glucose: 104 mg/dL — ABNORMAL HIGH (ref 70–99)
Potassium: 4.4 mmol/L (ref 3.5–5.2)
Sodium: 145 mmol/L — ABNORMAL HIGH (ref 134–144)
Total Protein: 7.1 g/dL (ref 6.0–8.5)
eGFR: 66 mL/min/{1.73_m2} (ref >59)

## 2021-05-22 LAB — CBC WITH DIFFERENTIAL/PLATELET
Basophils Absolute: 0 10*3/uL (ref 0.0–0.2)
Basos: 1 %
EOS (ABSOLUTE): 0.1 10*3/uL (ref 0.0–0.4)
Eos: 2 %
Hematocrit: 35.2 % (ref 34.0–46.6)
Hemoglobin: 12 g/dL (ref 11.1–15.9)
Immature Grans (Abs): 0 10*3/uL (ref 0.0–0.1)
Immature Granulocytes: 0 %
Lymphocytes Absolute: 1.3 10*3/uL (ref 0.7–3.1)
Lymphs: 25 %
MCH: 27.8 pg (ref 26.6–33.0)
MCHC: 34.1 g/dL (ref 31.5–35.7)
MCV: 82 fL (ref 79–97)
Monocytes Absolute: 0.5 10*3/uL (ref 0.1–0.9)
Monocytes: 10 %
Neutrophils Absolute: 3.2 10*3/uL (ref 1.4–7.0)
Neutrophils: 62 %
Platelets: 220 10*3/uL (ref 150–450)
RBC: 4.32 x10E6/uL (ref 3.77–5.28)
RDW: 13.6 % (ref 11.7–15.4)
WBC: 5.1 10*3/uL (ref 3.4–10.8)

## 2021-05-22 LAB — MAGNESIUM: Magnesium: 2.3 mg/dL (ref 1.6–2.3)

## 2021-05-22 LAB — LP+NON-HDL CHOLESTEROL
Cholesterol, Total: 199 mg/dL (ref 100–199)
HDL: 65 mg/dL (ref >39)
LDL Chol Calc (NIH): 125 mg/dL — ABNORMAL HIGH (ref 0–99)
Total Non-HDL-Chol (LDL+VLDL): 134 mg/dL — ABNORMAL HIGH (ref 0–129)
Triglycerides: 47 mg/dL (ref 0–149)
VLDL Cholesterol Cal: 9 mg/dL (ref 5–40)

## 2021-05-22 LAB — HEMOGLOBIN A1C
Est. average glucose Bld gHb Est-mCnc: 123 mg/dL
Hgb A1c MFr Bld: 5.9 % — ABNORMAL HIGH (ref 4.8–5.6)

## 2021-06-02 ENCOUNTER — Other Ambulatory Visit: Payer: Medicaid Other

## 2021-06-04 ENCOUNTER — Ambulatory Visit
Admission: RE | Admit: 2021-06-04 | Discharge: 2021-06-04 | Disposition: A | Discharge status: Home / Self Care | Payer: Medicaid Other | Source: Ambulatory Visit | Attending: Family Medicine | Admitting: Family Medicine

## 2021-06-04 DIAGNOSIS — R928 Other abnormal and inconclusive findings on diagnostic imaging of breast: Secondary | ICD-10-CM

## 2021-06-04 DIAGNOSIS — N6002 Solitary cyst of left breast: Secondary | ICD-10-CM | POA: Diagnosis not present

## 2021-07-18 ENCOUNTER — Ambulatory Visit: Payer: Medicaid Other

## 2021-07-18 ENCOUNTER — Ambulatory Visit: Payer: Medicaid Other | Attending: Family Medicine

## 2021-07-18 DIAGNOSIS — Z23 Encounter for immunization: Secondary | ICD-10-CM | POA: Diagnosis not present

## 2021-07-18 NOTE — Progress Notes (Signed)
Pt arrived for 2nd shingles vaccine Vaccine was given in left deltoid muscle. Pt tolerated injection well.

## 2021-11-07 ENCOUNTER — Other Ambulatory Visit: Payer: Medicaid Other

## 2021-11-24 ENCOUNTER — Ambulatory Visit: Payer: Medicaid Other | Admitting: Family Medicine

## 2021-11-24 ENCOUNTER — Other Ambulatory Visit: Payer: Self-pay | Admitting: Family Medicine

## 2021-11-24 DIAGNOSIS — I10 Essential (primary) hypertension: Secondary | ICD-10-CM

## 2021-11-25 ENCOUNTER — Ambulatory Visit: Payer: Medicaid Other | Attending: Family Medicine | Admitting: Nurse Practitioner

## 2021-11-25 ENCOUNTER — Encounter: Payer: Self-pay | Admitting: Nurse Practitioner

## 2021-11-25 VITALS — BP 149/75 | HR 71 | Ht 67.0 in | Wt 225.8 lb

## 2021-11-25 DIAGNOSIS — M25552 Pain in left hip: Secondary | ICD-10-CM | POA: Diagnosis not present

## 2021-11-25 DIAGNOSIS — M25551 Pain in right hip: Secondary | ICD-10-CM

## 2021-11-25 DIAGNOSIS — I10 Essential (primary) hypertension: Secondary | ICD-10-CM | POA: Diagnosis not present

## 2021-11-25 DIAGNOSIS — R7303 Prediabetes: Secondary | ICD-10-CM

## 2021-11-25 DIAGNOSIS — C499 Malignant neoplasm of connective and soft tissue, unspecified: Secondary | ICD-10-CM

## 2021-11-25 DIAGNOSIS — Z1211 Encounter for screening for malignant neoplasm of colon: Secondary | ICD-10-CM

## 2021-11-25 HISTORY — DX: Malignant neoplasm of connective and soft tissue, unspecified: C49.9

## 2021-11-25 MED ORDER — MELOXICAM 15 MG PO TABS: 7.5000 mg | ORAL_TABLET | Freq: Every day | ORAL | 1 refills | Status: DC | PRN

## 2021-11-25 MED ORDER — SPIRONOLACTONE 25 MG PO TABS: 25.0000 mg | ORAL_TABLET | Freq: Every day | ORAL | 1 refills | Status: DC

## 2021-11-25 NOTE — Progress Notes (Signed)
Pelvic pain when walking or sitting for a long period of time. Onset two days

## 2021-11-25 NOTE — Progress Notes (Signed)
Assessment & Plan:  Kaylee Nelson was seen today for hypertension.  Diagnoses and all orders for this visit:  Essential hypertension -     spironolactone (ALDACTONE) 25 MG tablet; Take 1 tablet (25 mg total) by mouth daily. -     CMP14+EGFR Continue all antihypertensives as prescribed.  Reminded to bring in blood pressure log for follow  up appointment.  RECOMMENDATIONS: DASH/Mediterranean Diets are healthier choices for HTN.    Prediabetes -     Hemoglobin A1c  Bilateral hip pain -     meloxicam (MOBIC) 15 MG tablet; Take 0.5-1 tablets (7.5-15 mg total) by mouth daily as needed for pain. Work on losing weight to help reduce joint pain. May alternate with heat and ice application for pain relief. May also alternate with acetaminophen and Ibuprofen as prescribed pain relief. Other alternatives include massage, acupuncture and water aerobics.    Colon cancer screening -     Ambulatory referral to Gastroenterology    Patient has been counseled on age-appropriate routine health concerns for screening and prevention. These are reviewed and up-to-date. Referrals have been placed accordingly. Immunizations are up-to-date or declined.    Subjective:   Chief Complaint  Patient presents with   Hypertension   HPI Kaylee Nelson 66 y.o. female presents to office today for follow up to HTN and medication refills.  She is a patient of Dr. Smitty Pluck with last office visit 6 months ago.   She has a past medical history of Heart murmur, Hypertension, and Sickle cell trait ([place]).     HTN Blood pressure is elevated today. Will refill carvedilol 25 mg BID, losartan 100 mg daily and spironolactone 2m daily.  BP Readings from Last 3 Encounters:  11/25/21 (!) 149/75  05/21/21 121/71  11/13/20 134/69    Hip Pain: Patient complains of bilaterally hip and groin pain. Onset of the symptoms was yesterday. Inciting event: none. Current symptoms include is worse with weight bearing, is  aggravated by walking, and is better with activity. Associated symptoms: none.  Patient's overall course: stable. Patient has had prior hip problems. Previous visits for this problem: yes, last seen 1 year ago. Evaluation to date: plain films, which were abnormal  showing OA .  Treatment to date:  tramadol and muscle relaxant  Prediabetes Well controlled with diet  Lab Results  Component Value Date   HGBA1C 5.9 (H) 05/21/2021    Review of Systems  Constitutional:  Negative for fever, malaise/fatigue and weight loss.  HENT: Negative.  Negative for nosebleeds.   Eyes: Negative.  Negative for blurred vision, double vision and photophobia.  Respiratory: Negative.  Negative for cough and shortness of breath.   Cardiovascular: Negative.  Negative for chest pain, palpitations and leg swelling.  Gastrointestinal: Negative.  Negative for heartburn, nausea and vomiting.  Musculoskeletal:  Positive for joint pain. Negative for myalgias.  Neurological: Negative.  Negative for dizziness, focal weakness, seizures and headaches.  Psychiatric/Behavioral: Negative.  Negative for suicidal ideas.     Past Medical History:  Diagnosis Date   Heart murmur    as a child   Hypertension    Phlegm in throat    LAST WEEK OR TWO NO FEVER   [place]le [place]     Past Surgical History:  Procedure Laterality Date   ABDOMINAL HYSTERECTOMY     ADRENALECTOMY N/A 08/18/2018   Procedure: OPEN LEFT ADRENALECTOMY;  Surgeon: GArmandina Gemma MD;  Location: [facility];  Service: General;  Laterality: N/A;   CESAREAN  SECTION      Family History  Problem Relation Age of Onset   Cancer Mother    Breast cancer Mother    Cancer Maternal Aunt    Breast cancer Maternal Aunt    Cancer Daughter    Breast cancer Daughter    BRCA 1/2 Cousin     Social History Reviewed with no changes to be made today.   Outpatient Medications Prior to Visit  Medication Sig Dispense Refill   carvedilol (COREG) 25 MG tablet Take 1  tablet (25 mg total) by mouth 2 (two) times daily with a meal. 180 tablet 1   ezetimibe (ZETIA) 10 MG tablet Take 1 tablet (10 mg total) by mouth daily. 90 tablet 1   losartan (COZAAR) 100 MG tablet Take 1 tablet (100 mg total) by mouth daily. 90 tablet 1   traMADol (ULTRAM) 50 MG tablet Take 1 tablet (50 mg total) by mouth daily as needed. 30 tablet 1   meloxicam (MOBIC) 15 MG tablet Take 0.5-1 tablets (7.5-15 mg total) by mouth daily as needed for pain. 30 tablet 6   spironolactone (ALDACTONE) 25 MG tablet Take 1 tablet by mouth once daily 30 tablet 0   No facility-administered medications prior to visit.    Allergies  Allergen Reactions   Shrimp (Diagnostic) Hives   Sulfa Antibiotics Hives    Patient states allergic to sulfa   Shrimp [Shellfish Allergy]        Objective:    BP (!) 149/75   Pulse 71   Ht 5' 7" (1.702 m)   Wt 225 lb 12.8 oz (102.4 kg)   SpO2 98%   BMI 35.37 kg/m  Wt Readings from Last 3 Encounters:  11/25/21 225 lb 12.8 oz (102.4 kg)  05/21/21 217 lb 3.2 oz (98.5 kg)  11/13/20 219 lb 9.6 oz (99.6 kg)    Physical Exam Vitals and nursing note reviewed.  Constitutional:      Appearance: She is well-developed.  HENT:     Head: Normocephalic and atraumatic.  Cardiovascular:     Rate and Rhythm: Normal rate and regular rhythm.     Heart sounds: Normal heart sounds. No murmur heard.    No friction rub. No gallop.  Pulmonary:     Effort: Pulmonary effort is normal. No tachypnea or respiratory distress.     Breath sounds: Normal breath sounds. No decreased breath sounds, wheezing, rhonchi or rales.  Chest:     Chest wall: No tenderness.  Abdominal:     General: Bowel sounds are normal.     Palpations: Abdomen is soft.  Musculoskeletal:        General: Normal range of motion.     Cervical back: Normal range of motion.  Skin:    General: Skin is warm and dry.  Neurological:     Mental Status: She is alert and oriented to person, place, and time.      Coordination: Coordination normal.  Psychiatric:        Behavior: Behavior normal. Behavior is cooperative.        Thought Content: Thought content normal.        Judgment: Judgment normal.          Patient has been counseled extensively about nutrition and exercise as well as the importance of adherence with medications and regular follow-up. The patient was given clear instructions to go to ER or return to medical center if symptoms don't improve, worsen or new problems develop. The patient verbalized understanding.  Follow-up: Return in about 3 months (around 02/25/2022) for newlin PCP.   Gildardo Pounds, FNP-BC [facility], [place]   11/30/2021, 6:22 PM

## 2021-11-27 ENCOUNTER — Encounter: Payer: Self-pay | Admitting: Internal Medicine

## 2021-12-19 ENCOUNTER — Ambulatory Visit (AMBULATORY_SURGERY_CENTER): Payer: Self-pay

## 2021-12-19 VITALS — Ht 68.0 in | Wt 226.0 lb

## 2021-12-19 DIAGNOSIS — Z1211 Encounter for screening for malignant neoplasm of colon: Secondary | ICD-10-CM

## 2021-12-19 MED ORDER — PLENVU 140 G PO SOLR: 1.0000 | ORAL | 0 refills | Status: DC

## 2021-12-19 NOTE — Progress Notes (Signed)
No egg or soy allergy known to patient;  No issues known to pt with past sedation with any surgeries or procedures; Patient denies ever being told they had issues or difficulty with intubation;  No FH of Malignant Hyperthermia; Pt is not on diet pills; Pt is not on home 02;  Pt is not on blood thinners;  Pt denies issues with constipation;  No A fib or A flutter; Have any cardiac testing pending--NO Pt instructed to use Singlecare.com or GoodRx for a price reduction on prep   Insurance verified during [place] appt=Medicaid Healthy Blue  Patient's chart reviewed by Osvaldo Angst CNRA prior to previsit and patient appropriate for the [facility].  Previsit completed and red dot placed by patient's name on their procedure day (on provider's schedule).

## 2022-01-06 ENCOUNTER — Inpatient Hospital Stay: Admission: RE | Admit: 2022-01-06 | Payer: Medicaid Other | Source: Ambulatory Visit

## 2022-01-16 ENCOUNTER — Encounter: Payer: Self-pay | Admitting: Internal Medicine

## 2022-01-16 ENCOUNTER — Ambulatory Visit (AMBULATORY_SURGERY_CENTER): Payer: Medicaid Other | Admitting: Internal Medicine

## 2022-01-16 VITALS — BP 127/59 | HR 69 | Temp 96.6°F | Resp 15 | Ht 68.0 in | Wt 226.0 lb

## 2022-01-16 DIAGNOSIS — K5730 Diverticulosis of large intestine without perforation or abscess without bleeding: Secondary | ICD-10-CM

## 2022-01-16 DIAGNOSIS — I10 Essential (primary) hypertension: Secondary | ICD-10-CM | POA: Diagnosis not present

## 2022-01-16 DIAGNOSIS — Z1211 Encounter for screening for malignant neoplasm of colon: Secondary | ICD-10-CM

## 2022-01-16 MED ORDER — SODIUM CHLORIDE 0.9 % IV SOLN: 500.0000 mL | INTRAVENOUS | Status: DC

## 2022-01-16 NOTE — Patient Instructions (Signed)
Impression/Recommendations:  Diverticulosis and hemorrhoid handouts given to patient.  Repeat colonoscopy in 5 years for screening purposes.  May need a different preparation next time.  YOU HAD AN ENDOSCOPIC PROCEDURE TODAY AT [facility]:   Refer to the procedure report that was given to you for any specific questions about what was found during the examination.  If the procedure report does not answer your questions, please call your gastroenterologist to clarify.  If you requested that your care partner not be given the details of your procedure findings, then the procedure report has been included in a sealed envelope for you to review at your convenience later.  YOU SHOULD EXPECT: Some feelings of bloating in the abdomen. Passage of more gas than usual.  Walking can help get rid of the air that was put into your GI tract during the procedure and reduce the bloating. If you had a lower endoscopy (such as a colonoscopy or flexible sigmoidoscopy) you may notice spotting of blood in your stool or on the toilet paper. If you underwent a bowel prep for your procedure, you may not have a normal bowel movement for a few days.  Please Note:  You might notice some irritation and congestion in your nose or some drainage.  This is from the oxygen used during your procedure.  There is no need for concern and it should clear up in a day or so.  SYMPTOMS TO REPORT IMMEDIATELY:  Following lower endoscopy (colonoscopy or flexible sigmoidoscopy):  Excessive amounts of blood in the stool  Significant tenderness or worsening of abdominal pains  Swelling of the abdomen that is new, acute  Fever of 100F or higher  For urgent or emergent issues, a gastroenterologist can be reached at any hour by calling [phone]. Do not use MyChart messaging for urgent concerns.    DIET:  We do recommend a small meal at first, but then you may proceed to your regular diet.  Drink plenty of fluids but  you should avoid alcoholic beverages for 24 hours.  ACTIVITY:  You should plan to take it easy for the rest of today and you should NOT DRIVE or use heavy machinery until tomorrow (because of the sedation medicines used during the test).    FOLLOW UP: Our staff will call the number listed on your records the next business day following your procedure.  We will call around 7:15- 8:00 am to check on you and address any questions or concerns that you may have regarding the information given to you following your procedure. If we do not reach you, we will leave a message.     If any biopsies were taken you will be contacted by phone or by letter within the next 1-3 weeks.  Please call us at [phone] if you have not heard about the biopsies in 3 weeks.    SIGNATURES/CONFIDENTIALITY: You and/or your care partner have signed paperwork which will be entered into your electronic medical record.  These signatures attest to the fact that that the information above on your After Visit Summary has been reviewed and is understood.  Full responsibility of the confidentiality of this discharge information lies with you and/or your care-partner.

## 2022-01-16 NOTE — Progress Notes (Signed)
GASTROENTEROLOGY PROCEDURE H&P NOTE   Primary Care Physician: Charlott Rakes, MD    Reason for Procedure:   Colon cancer screening  Plan:    Colonoscopy  Patient is appropriate for endoscopic procedure(s) in the ambulatory ([place]) setting.  The nature of the procedure, as well as the risks, benefits, and alternatives were carefully and thoroughly reviewed with the patient. Ample time for discussion and questions allowed. The patient understood, was satisfied, and agreed to proceed.     HPI: Kaylee Nelson is a 66 y.o. female who presents for colonoscopy for colon cancer screening. Denies blood in stools, changes in bowel habits, or unintentional weight loss. Denies family history of colon cancer. Her last colonoscopy was over 10 years ago.   Past Medical History:  Diagnosis Date   Arthritis    bilateral knees   Blood transfusion without reported diagnosis 2007   Heart murmur    as a child   Hypertension    on meds   Phlegm in throat    LAST WEEK OR TWO NO FEVER   Sickle cell trait ([place])    on preventative iron po meds    Past Surgical History:  Procedure Laterality Date   ABDOMINAL HYSTERECTOMY  2007   ADRENALECTOMY N/A 08/18/2018   Procedure: OPEN LEFT ADRENALECTOMY;  Surgeon: Armandina Gemma, MD;  Location: [facility];  Service: General;  Laterality: N/A;   [place] EXTRACTION  2017    Prior to Admission medications   Medication Sig Start Date End Date Taking? Authorizing Provider  carvedilol (COREG) 25 MG tablet Take 1 tablet (25 mg total) by mouth 2 (two) times daily with a meal. 05/21/21  Yes Newlin, Enobong, MD  ezetimibe (ZETIA) 10 MG tablet Take 1 tablet (10 mg total) by mouth daily. 05/21/21  Yes Charlott Rakes, MD  Ferrous Sulfate (IRON PO) Take 18 mg by mouth daily at 6 (six) AM.   Yes [provider]  losartan (COZAAR) 100 MG tablet Take 1 tablet (100 mg total) by mouth daily. 05/21/21  Yes Charlott Rakes, MD   PEG-KCl-NaCl-NaSulf-Na Asc-C (PLENVU) 140 g SOLR Take 1 kit by mouth as directed. Generic okay-please use SingleCare or GoodRx for price if able; 12/19/21  Yes Sharyn Creamer, MD  spironolactone (ALDACTONE) 25 MG tablet Take 1 tablet (25 mg total) by mouth daily. 11/25/21  Yes Gildardo Pounds, NP  Artificial Tear Ointment (DRY EYES OP) Apply 2 drops to eye daily at 6 (six) AM.    [provider]  CALCIUM MAGNESIUM ZINC PO Take 1 tablet by mouth daily at 6 (six) AM.    [provider]  CALCIUM PO Take 600 mg by mouth daily at 6 (six) AM.    [provider]  Cyanocobalamin (VITAMIN B-12 PO) Take 5,000 mcg by mouth daily at 6 (six) AM.    [provider]  meloxicam (MOBIC) 15 MG tablet Take 0.5-1 tablets (7.5-15 mg total) by mouth daily as needed for pain. 11/25/21   Gildardo Pounds, NP    Current Outpatient Medications  Medication Sig Dispense Refill   carvedilol (COREG) 25 MG tablet Take 1 tablet (25 mg total) by mouth 2 (two) times daily with a meal. 180 tablet 1   ezetimibe (ZETIA) 10 MG tablet Take 1 tablet (10 mg total) by mouth daily. 90 tablet 1   Ferrous Sulfate (IRON PO) Take 18 mg by mouth daily at 6 (six) AM.     losartan (  COZAAR) 100 MG tablet Take 1 tablet (100 mg total) by mouth daily. 90 tablet 1   PEG-KCl-NaCl-NaSulf-Na Asc-C (PLENVU) 140 g SOLR Take 1 kit by mouth as directed. Generic okay-please use SingleCare or GoodRx for price if able; 1 each 0   spironolactone (ALDACTONE) 25 MG tablet Take 1 tablet (25 mg total) by mouth daily. 90 tablet 1   Artificial Tear Ointment (DRY EYES OP) Apply 2 drops to eye daily at 6 (six) AM.     CALCIUM MAGNESIUM ZINC PO Take 1 tablet by mouth daily at 6 (six) AM.     CALCIUM PO Take 600 mg by mouth daily at 6 (six) AM.     Cyanocobalamin (VITAMIN B-12 PO) Take 5,000 mcg by mouth daily at 6 (six) AM.     meloxicam (MOBIC) 15 MG tablet Take 0.5-1 tablets (7.5-15 mg total) by mouth daily as needed for pain.  30 tablet 1   Current Facility-Administered Medications  Medication Dose Route Frequency Provider Last Rate Last Admin   0.9 %  sodium chloride infusion  500 mL Intravenous Continuous Sharyn Creamer, MD        Allergies as of 01/16/2022 - Review Complete 01/16/2022  Allergen Reaction Noted   Shrimp (diagnostic) Hives, Itching, Swelling, and Rash 09/19/2020   Sulfa antibiotics Hives, Itching, and Rash 01/10/2014   Shrimp [shellfish allergy]  10/12/2016    Family History  Problem Relation Age of Onset   Cancer Mother    Breast cancer Mother    Cancer Maternal Aunt    Breast cancer Maternal Aunt    Cancer Daughter    Breast cancer Daughter    BRCA 1/2 Cousin    Colon polyps Neg Hx    Colon cancer Neg Hx    Esophageal cancer Neg Hx    Rectal cancer Neg Hx    Stomach cancer Neg Hx     Social History   Socioeconomic History   Marital status: Single    Spouse name: Not on file   Number of children: Not on file   Years of education: Not on file   Highest education level: Not on file  Occupational History   Not on file  Tobacco Use   Smoking status: Never   Smokeless tobacco: Never  Vaping Use   Vaping Use: Never used  Substance and Sexual Activity   Alcohol use: No    Alcohol/week: 0.0 standard drinks of alcohol   Drug use: No   Sexual activity: Never  Other Topics Concern   Not on file  Social History Narrative   Not on file   Social Determinants of Health   Financial Resource Strain: Not on file  Food Insecurity: Not on file  Transportation Needs: Not on file  Physical Activity: Not on file  Stress: Not on file  Social Connections: Not on file  Intimate Partner Violence: Not on file    Physical Exam: Vital signs in last 24 hours: BP (!) 142/77   Pulse 70   Temp (!) 96.6 F (35.9 C) (Temporal)   Ht _0  (1.727 m)   Wt 226 lb (102.5 kg)   SpO2 100%   BMI 34.36 kg/m  GEN: NAD EYE: Sclerae anicteric ENT: MMM CV: Non-tachycardic Pulm: No  increased work of breathing GI: Soft, NT/ND NEURO:  Alert & Oriented   Christia Reading, MD [facility]  01/16/2022 10:36 AM

## 2022-01-16 NOTE — Progress Notes (Signed)
Pt's states no medical or surgical changes since previsit or office visit. 

## 2022-01-16 NOTE — Progress Notes (Signed)
PT taken to PACU. Monitors in place. VSS. Report given to RN. 

## 2022-01-16 NOTE — Op Note (Signed)
[facility] Patient Name: Kaylee Nelson Procedure Date: 01/16/2022 11:09 AM MRN: [id number] Endoscopist: Adline Mango [place] , , [phone] Age: 66 Referring MD:  Date of Birth: 18-Jan-1956 Gender: Female Account #: [id number] Procedure:                Colonoscopy Indications:              Screening for colorectal malignant neoplasm Medicines:                Monitored Anesthesia Care Procedure:                Pre-Anesthesia Assessment:                           - Prior to the procedure, a History and Physical                            was performed, and patient medications and                            allergies were reviewed. The patient's tolerance of                            previous anesthesia was also reviewed. The risks                            and benefits of the procedure and the sedation                            options and risks were discussed with the patient.                            All questions were answered, and informed consent                            was obtained. Prior Anticoagulants: The patient has                            taken no anticoagulant or antiplatelet agents. ASA                            Grade Assessment: II - A patient with mild systemic                            disease. After reviewing the risks and benefits,                            the patient was deemed in satisfactory condition to                            undergo the procedure.                           After obtaining informed consent, the colonoscope  was passed under direct vision. Throughout the                            procedure, the patient's blood pressure, pulse, and                            oxygen saturations were monitored continuously. The                            Olympus CF-HQ190L (Serial# [id number]) Colonoscope was                            introduced through the anus and advanced to the the                             cecum, identified by appendiceal orifice and                            ileocecal valve. The colonoscopy was performed                            without difficulty. The patient tolerated the                            procedure well. The quality of the bowel                            preparation was fair. The ileocecal valve,                            appendiceal orifice, and rectum were photographed. Scope In: 11:31:20 AM Scope Out: 11:54:36 AM Scope Withdrawal Time: 0 hours 18 minutes 4 seconds  Total Procedure Duration: 0 hours 23 minutes 16 seconds  Findings:                 Multiple diverticula were found in the sigmoid                            colon, descending colon and ascending colon.                           Non-bleeding internal hemorrhoids were found during                            retroflexion. Complications:            No immediate complications. Estimated Blood Loss:     Estimated blood loss: none. Impression:               - Diverticulosis in the sigmoid colon, in the                            descending colon and in the ascending colon.                           - Non-bleeding internal  hemorrhoids.                           - No specimens collected. Recommendation:           - Discharge patient to home (with escort).                           - Repeat colonoscopy in 5 years for screening                            purposes due to a fair prep. May need to switch to                            a different preparation next time (alternative to                            Plenvu)                           - The findings and recommendations were discussed                            with the patient. Dr Georgian Co "Lyndee Leo" Lorenso Courier,  01/16/2022 12:04:00 PM

## 2022-01-19 ENCOUNTER — Telehealth: Payer: Self-pay | Admitting: *Deleted

## 2022-01-19 NOTE — Telephone Encounter (Signed)
  Follow up Call-     01/16/2022   10:25 AM  Call back number  Post procedure Call Back phone  # [phone]  Permission to leave phone message Yes     Patient questions:  Do you have a fever, pain , or abdominal swelling? No. Pain Score  0 *  Have you tolerated food without any problems? Yes.    Have you been able to return to your normal activities? Yes.    Do you have any questions about your discharge instructions: Diet   No. Medications  No. Follow up visit  No.  Do you have questions or concerns about your Care? No.  Actions: * If pain score is 4 or above: No action needed, pain <4.

## 2022-01-22 DIAGNOSIS — C499 Malignant neoplasm of connective and soft tissue, unspecified: Secondary | ICD-10-CM | POA: Diagnosis not present

## 2022-01-22 DIAGNOSIS — K5730 Diverticulosis of large intestine without perforation or abscess without bleeding: Secondary | ICD-10-CM | POA: Diagnosis not present

## 2022-02-21 ENCOUNTER — Other Ambulatory Visit: Payer: Self-pay

## 2022-02-21 ENCOUNTER — Ambulatory Visit (HOSPITAL_COMMUNITY)
Admission: EM | Admit: 2022-02-21 | Discharge: 2022-02-21 | Disposition: A | Discharge status: Home / Self Care | Payer: Medicaid Other | Attending: Physician Assistant | Admitting: Physician Assistant

## 2022-02-21 ENCOUNTER — Encounter (HOSPITAL_COMMUNITY): Payer: Self-pay | Admitting: *Deleted

## 2022-02-21 DIAGNOSIS — K0889 Other specified disorders of teeth and supporting structures: Secondary | ICD-10-CM

## 2022-02-21 DIAGNOSIS — H9202 Otalgia, left ear: Secondary | ICD-10-CM | POA: Diagnosis not present

## 2022-02-21 DIAGNOSIS — K047 Periapical abscess without sinus: Secondary | ICD-10-CM

## 2022-02-21 MED ORDER — PENICILLIN V POTASSIUM 500 MG PO TABS: 500.0000 mg | ORAL_TABLET | Freq: Four times a day (QID) | ORAL | 0 refills | Status: AC

## 2022-02-21 MED ORDER — TRAMADOL HCL 50 MG PO TABS: 50.0000 mg | ORAL_TABLET | Freq: Four times a day (QID) | ORAL | 0 refills | Status: DC | PRN

## 2022-02-21 NOTE — ED Provider Notes (Signed)
[place]    CSN: [id number] Arrival date & time: 02/21/22  1240      History   Chief Complaint Chief Complaint  Patient presents with   Dental Pain   Otalgia    HPI Kaylee Nelson is a 67 y.o. female.   70-year-old female presents with left lower tooth pain.  Patient indicates for the past several days she has been having increasing left lower tooth pain and discomfort.  She relates that the pain is excruciating.  She indicates she has been taking Tylenol high-dose for pain relief but this has not helped.  Patient indicates that she feels the pain radiating from the jaw area up to the left ear causing left ear pain.  She denies fever, chills, cough or congestion.  Patient indicates that she does have an appointment to see a dentist on Monday, February 23, 2022.  Patient indicates she is also using warm compresses to the area with minimal relief.   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      Home Medications    Prior to Admission medications   Medication Sig Start Date End  Date Taking? Authorizing Provider  penicillin v potassium (VEETID) 500 MG tablet Take 1 tablet (500 mg total) by mouth 4 (four) times daily for 10 days. 02/21/22 03/03/22 Yes Nyoka Lint, PA-C  traMADol (ULTRAM) 50 MG tablet Take 1 tablet (50 mg total) by mouth every 6 (six) hours as needed. 02/21/22  Yes Nyoka Lint, PA-C  Artificial Tear Ointment (DRY EYES OP) Apply 2 drops to eye daily at 6 (six) AM.    [provider]  CALCIUM MAGNESIUM ZINC PO Take 1 tablet by mouth daily at 6 (six) AM.    [provider]  CALCIUM PO Take 600 mg by mouth daily at 6 (six) AM.    [provider]  carvedilol (COREG) 25 MG tablet Take 1 tablet (25 mg total) by mouth 2 (two) times daily with a meal. 05/21/21   Charlott Rakes, MD  Cyanocobalamin (VITAMIN B-12 PO) Take 5,000 mcg by mouth daily at 6 (six) AM.    [provider]  ezetimibe (ZETIA) 10 MG tablet Take 1 tablet (10 mg total) by mouth daily. 05/21/21   Charlott Rakes, MD  Ferrous Sulfate (IRON PO) Take 18 mg by mouth daily at 6 (six) AM.    [provider]  losartan (COZAAR) 100 MG tablet Take 1 tablet (100 mg total) by  mouth daily. 05/21/21   Charlott Rakes, MD  meloxicam (MOBIC) 15 MG tablet Take 0.5-1 tablets (7.5-15 mg total) by mouth daily as needed for pain. 11/25/21   Gildardo Pounds, NP  PEG-KCl-NaCl-NaSulf-Na Asc-C (PLENVU) 140 g SOLR Take 1 kit by mouth as directed. Generic okay-please use SingleCare or GoodRx for price if able; 12/19/21   Sharyn Creamer, MD  spironolactone (ALDACTONE) 25 MG tablet Take 1 tablet (25 mg total) by mouth daily. 11/25/21   Gildardo Pounds, NP    Family History Family History  Problem Relation Age of Onset   Cancer Mother    Breast cancer Mother    Cancer Maternal Aunt    Breast cancer Maternal Aunt    Cancer Daughter    Breast cancer Daughter    BRCA 1/2 Cousin    Colon polyps Neg Hx    Colon cancer Neg Hx    Esophageal cancer Neg Hx    Rectal cancer Neg Hx     Stomach cancer Neg Hx     Social History Social History   Tobacco Use   Smoking status: Never   Smokeless tobacco: Never  Vaping Use   Vaping Use: Never used  Substance Use Topics   Alcohol use: No    Alcohol/week: 0.0 standard drinks of alcohol   Drug use: No     Allergies   Shrimp (diagnostic), Sulfa antibiotics, and Shrimp [shellfish allergy]   Review of Systems Review of Systems  HENT:  Positive for dental problem (left tooth pain) and ear pain.      Physical Exam Triage Vital Signs ED Triage Vitals  Enc Vitals Group     BP 02/21/22 1425 (!) 166/81     Pulse Rate 02/21/22 1425 67     Resp 02/21/22 1425 20     Temp 02/21/22 1425 97.8 F (36.6 C)     Temp src --      SpO2 02/21/22 1425 96 %     Weight --      Height --      Head Circumference --      Peak Flow --      Pain Score 02/21/22 1423 9     Pain Loc --      Pain Edu? --      Excl. in [place]? --    No data found.  Updated Vital Signs BP (!) 166/81   Pulse 67   Temp 97.8 F (36.6 C)   Resp 20   SpO2 96%   Visual Acuity Right Eye Distance:   Left Eye Distance:   Bilateral Distance:    Right Eye Near:   Left Eye Near:    Bilateral Near:     Physical Exam Constitutional:      Appearance: Normal appearance.  HENT:     Right Ear: Tympanic membrane and ear canal normal.     Left Ear: Tympanic membrane and ear canal normal.     Mouth/Throat:      Comments: Mouth: There is mild swelling at the gumline left lower molar area.  Face: Is palpated along the left lower jawline up to TMJ minimal swelling, no redness. Neurological:     Mental Status: She is alert.      UC Treatments / Results  Labs (all labs ordered are listed, but only abnormal results are displayed) Labs Reviewed - No data to display  EKG   Radiology No results found.  Procedures Procedures (including critical care time)  Medications  Ordered in UC Medications - No data to display  Initial Impression /  Assessment and Plan / UC Course  I have reviewed the triage vital signs and the nursing notes.  Pertinent labs & imaging results that were available during my care of the patient were reviewed by me and considered in my medical decision making (see chart for details).    Plan: The diagnosis will be treated with the following: 1.  Dental pain: A.  Ultram tablets every 6 hours to help relieve pain. 2.  Dental abscess: A.  Pen-Vee K 500 mg every 6 hours to treat infection. 3.  Left ear pain: A.  Ultram tablets every 6 hours for pain relief. 4.  Patient advised to keep her appointment on Monday for dental evaluation. Final Clinical Impressions(s) / UC Diagnoses   Final diagnoses:  Pain, dental  Dental abscess  Left ear pain     Discharge Instructions      Tends to take the pain VK 500 mg every 6 hours on a regular basis until seen by the dentist as this will treat infection. Advised take the Ultram tablets, 1 every 6 hours for pain relief.  Advised to keep the appointment with the dentist so that they can evaluate and hopefully resolve the tooth issue.  Return to urgent care as needed.   ED Prescriptions     Medication Sig Dispense Auth. Provider   penicillin v potassium (VEETID) 500 MG tablet Take 1 tablet (500 mg total) by mouth 4 (four) times daily for 10 days. 40 tablet Nyoka Lint, PA-C   traMADol (ULTRAM) 50 MG tablet Take 1 tablet (50 mg total) by mouth every 6 (six) hours as needed. 15 tablet Nyoka Lint, PA-C      I have reviewed the PDMP during this encounter.   Nyoka Lint, PA-C 02/21/22 1525

## 2022-02-21 NOTE — ED Triage Notes (Signed)
Pt reports Lt ear and lt tooth pain that started this AM.

## 2022-02-21 NOTE — Discharge Instructions (Addendum)
Tends to take the pain VK 500 mg every 6 hours on a regular basis until seen by the dentist as this will treat infection. Advised take the Ultram tablets, 1 every 6 hours for pain relief.  Advised to keep the appointment with the dentist so that they can evaluate and hopefully resolve the tooth issue.  Return to urgent care as needed.

## 2022-02-25 ENCOUNTER — Encounter: Payer: Self-pay | Admitting: Family Medicine

## 2022-02-25 ENCOUNTER — Ambulatory Visit: Payer: Medicaid Other | Attending: Family Medicine | Admitting: Family Medicine

## 2022-02-25 ENCOUNTER — Other Ambulatory Visit: Payer: Self-pay | Admitting: Family Medicine

## 2022-02-25 VITALS — BP 139/72 | HR 78 | Temp 97.8°F | Ht 68.0 in | Wt 223.0 lb

## 2022-02-25 DIAGNOSIS — R7303 Prediabetes: Secondary | ICD-10-CM | POA: Diagnosis not present

## 2022-02-25 DIAGNOSIS — M25551 Pain in right hip: Secondary | ICD-10-CM | POA: Diagnosis not present

## 2022-02-25 DIAGNOSIS — E7800 Pure hypercholesterolemia, unspecified: Secondary | ICD-10-CM

## 2022-02-25 DIAGNOSIS — I10 Essential (primary) hypertension: Secondary | ICD-10-CM

## 2022-02-25 DIAGNOSIS — M7522 Bicipital tendinitis, left shoulder: Secondary | ICD-10-CM

## 2022-02-25 DIAGNOSIS — M25552 Pain in left hip: Secondary | ICD-10-CM

## 2022-02-25 MED ORDER — EZETIMIBE 10 MG PO TABS: 10.0000 mg | ORAL_TABLET | Freq: Every day | ORAL | 1 refills | Status: DC

## 2022-02-25 MED ORDER — MELOXICAM 15 MG PO TABS
7.5000 mg | ORAL_TABLET | Freq: Every day | ORAL | 1 refills | Status: DC | PRN
Start: 2022-02-25 — End: 2022-06-17

## 2022-02-25 MED ORDER — CARVEDILOL 25 MG PO TABS: 25.0000 mg | ORAL_TABLET | Freq: Two times a day (BID) | ORAL | 1 refills | Status: DC

## 2022-02-25 MED ORDER — SPIRONOLACTONE 25 MG PO TABS: 25.0000 mg | ORAL_TABLET | Freq: Every day | ORAL | 1 refills | Status: DC

## 2022-02-25 MED ORDER — LOSARTAN POTASSIUM 100 MG PO TABS: 100.0000 mg | ORAL_TABLET | Freq: Every day | ORAL | 1 refills | Status: DC

## 2022-02-25 NOTE — Progress Notes (Signed)
Subjective:  Patient ID: Kaylee Nelson, female    DOB: 09/20/1955  Age: 67 y.o. MRN: [id number]  CC: Hypertension   HPI Kaylee Nelson is a 67 y.o. 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  She will be following up again in 6 months.  She has had pain in the left cubital fossa worse when she wakes up in the morning and this eases up. This has been present since she had labs drawn at her last office visit 9 months ago.  At that time she had stated they had difficulty obtaining her labs.  There was no bruising and no swelling at the site at the time of labs or any time off time.  She also endorses lifting her bags and purses on her left arm.  She is doing well on her antihypertensive.  Unable to tolerate his statin due to myalgias. Past Medical History:  Diagnosis Date   Arthritis    bilateral knees   Blood transfusion without reported diagnosis 2007   Heart murmur    as a child   Hypertension    on meds   Phlegm in throat    LAST WEEK OR TWO NO FEVER   Sickle cell trait ([place])    on preventative iron po meds    Past Surgical History:  Procedure Laterality Date   ABDOMINAL HYSTERECTOMY  2007   ADRENALECTOMY N/A 08/18/2018   Procedure: OPEN LEFT ADRENALECTOMY;  Surgeon: Armandina Gemma, MD;  Location: [facility];  Service: General;  Laterality: N/A;   [place] EXTRACTION  2017    Family History  Problem Relation Age of Onset   Cancer Mother    Breast cancer Mother    Cancer Maternal Aunt    Breast cancer Maternal Aunt    Cancer Daughter    Breast cancer Daughter    BRCA 1/2 Cousin    Colon polyps Neg Hx    Colon cancer Neg  Hx    Esophageal cancer Neg Hx    Rectal cancer Neg Hx    Stomach cancer Neg Hx     Social History   Socioeconomic History   Marital status: Single    Spouse name: Not on file   Number of children: Not on file   Years of education: Not on file   Highest education level: Not on file  Occupational History   Not on file  Tobacco Use   Smoking status: Never   Smokeless tobacco: Never  Vaping Use   Vaping Use: Never used  Substance and Sexual Activity   Alcohol use: No    Alcohol/week: 0.0 standard drinks of alcohol   Drug use: No   Sexual activity: Never  Other Topics Concern   Not on file  Social History Narrative   Not on file   Social Determinants of Health   Financial Resource Strain: Not on file  Food Insecurity: Not on file  Transportation Needs: Not on file  Physical Activity: Not on file  Stress: Not on file  Social Connections: Not on file    Allergies  Allergen Reactions   Shrimp (Diagnostic) Hives, Itching, Swelling and Rash   Sulfa Antibiotics  Hives, Itching and Rash   Shrimp [Shellfish Allergy]     Outpatient Medications Prior to Visit  Medication Sig Dispense Refill   Artificial Tear Ointment (DRY EYES OP) Apply 2 drops to eye daily at 6 (six) AM.     CALCIUM MAGNESIUM ZINC PO Take 1 tablet by mouth daily at 6 (six) AM.     CALCIUM PO Take 600 mg by mouth daily at 6 (six) AM.     Cyanocobalamin (VITAMIN B-12 PO) Take 5,000 mcg by mouth daily at 6 (six) AM.     Ferrous Sulfate (IRON PO) Take 18 mg by mouth daily at 6 (six) AM.     traMADol (ULTRAM) 50 MG tablet Take 1 tablet (50 mg total) by mouth every 6 (six) hours as needed. 15 tablet 0   carvedilol (COREG) 25 MG tablet Take 1 tablet (25 mg total) by mouth 2 (two) times daily with a meal. 180 tablet 1   ezetimibe (ZETIA) 10 MG tablet Take 1 tablet (10 mg total) by mouth daily. 90 tablet 1   losartan (COZAAR) 100 MG tablet Take 1 tablet (100 mg total) by mouth daily. 90 tablet 1   meloxicam  (MOBIC) 15 MG tablet Take 0.5-1 tablets (7.5-15 mg total) by mouth daily as needed for pain. 30 tablet 1   spironolactone (ALDACTONE) 25 MG tablet Take 1 tablet (25 mg total) by mouth daily. 90 tablet 1   PEG-KCl-NaCl-NaSulf-Na Asc-C (PLENVU) 140 g SOLR Take 1 kit by mouth as directed. Generic okay-please use SingleCare or GoodRx for price if able; (Patient not taking: Reported on 02/25/2022) 1 each 0   penicillin v potassium (VEETID) 500 MG tablet Take 1 tablet (500 mg total) by mouth 4 (four) times daily for 10 days. (Patient not taking: Reported on 02/25/2022) 40 tablet 0   Facility-Administered Medications Prior to Visit  Medication Dose Route Frequency Provider Last Rate Last Admin   0.9 %  sodium chloride infusion  500 mL Intravenous Continuous Sharyn Creamer, MD         ROS Review of Systems  Constitutional:  Negative for activity change and appetite change.  HENT:  Negative for sinus pressure and sore throat.   Respiratory:  Negative for chest tightness, shortness of breath and wheezing.   Cardiovascular:  Negative for chest pain and palpitations.  Gastrointestinal:  Negative for abdominal distention, abdominal pain and constipation.  Genitourinary: Negative.   Musculoskeletal:        See HPI  Psychiatric/Behavioral:  Negative for behavioral problems and dysphoric mood.     Objective:  BP 139/72   Pulse 78   Temp 97.8 F (36.6 C) (Oral)   Ht '5\' 8"'$  (1.727 m)   Wt 223 lb (101.2 kg)   SpO2 97%   BMI 33.91 kg/m      02/25/2022    4:15 PM 02/21/2022    2:25 PM 01/16/2022   12:19 PM  BP/Weight  Systolic BP [phone]  Diastolic BP 72 81 59  Wt. (Lbs) 223    BMI 33.91 kg/m2        Physical Exam Constitutional:      Appearance: She is well-developed.  Cardiovascular:     Rate and Rhythm: Normal rate.     Heart sounds: Normal heart sounds. No murmur heard. Pulmonary:     Effort: Pulmonary effort is normal.     Breath sounds: Normal breath sounds. No wheezing or  rales.  Chest:     Chest wall: No tenderness.  Abdominal:     General: Bowel sounds are normal. There is no distension.     Palpations: Abdomen is soft. There is no mass.     Tenderness: There is no abdominal tenderness.  Musculoskeletal:     Right lower leg: No edema.     Left lower leg: No edema.     Comments: Normal appearance of both cubital fossa Tenderness on palpation of left biceps tendon; right biceps tendon is normal  Neurological:     Mental Status: She is alert and oriented to person, place, and time.  Psychiatric:        Mood and Affect: Mood normal.        Latest Ref Rng & Units 05/21/2021   12:09 PM 07/25/2020    9:09 AM 10/31/2019   10:53 AM  CMP  Glucose 70 - 99 mg/dL 104  97  99   BUN 8 - 27 mg/dL '7  11  8   '$ Creatinine 0.57 - 1.00 mg/dL 0.95  0.91  0.76   Sodium 134 - 144 mmol/L 145  144  144   Potassium 3.5 - 5.2 mmol/L 4.4  4.7  3.9   Chloride 96 - 106 mmol/L 108  108  107   CO2 20 - 29 mmol/L '22  25  25   '$ Calcium 8.7 - 10.3 mg/dL 9.7  9.4  9.4   Total Protein 6.0 - 8.5 g/dL 7.1  6.8  7.0   Total Bilirubin 0.0 - 1.2 mg/dL 0.4  0.2  0.4   Alkaline Phos 44 - 121 IU/L 102  82  103   AST 0 - 40 IU/L '21  12  17   '$ ALT 0 - 32 IU/L '17  12  12     '$ Lipid Panel     Component Value Date/Time   CHOL 199 05/21/2021 1209   TRIG 47 05/21/2021 1209   HDL 65 05/21/2021 1209   CHOLHDL 3.6 07/25/2020 0909   CHOLHDL 2.8 12/17/2015 1035   VLDL 10 12/17/2015 1035   LDLCALC 125 (H) 05/21/2021 1209    CBC    Component Value Date/Time   WBC 5.1 05/21/2021 1209   WBC 9.7 08/23/2018 1255   RBC 4.32 05/21/2021 1209   RBC 3.59 (L) 08/23/2018 1255   HGB 12.0 05/21/2021 1209   HCT 35.2 05/21/2021 1209   PLT 220 05/21/2021 1209   MCV 82 05/21/2021 1209   MCH 27.8 05/21/2021 1209   MCH 28.7 08/23/2018 1255   MCHC 34.1 05/21/2021 1209   MCHC 32.2 08/23/2018 1255   RDW 13.6 05/21/2021 1209   LYMPHSABS 1.3 05/21/2021 1209   MONOABS 1.2 (H) 08/20/2018 0755   EOSABS  0.1 05/21/2021 1209   BASOSABS 0.0 05/21/2021 1209    Lab Results  Component Value Date   HGBA1C 5.9 (H) 05/21/2021    Assessment & Plan:  1. Essential hypertension Slightly above goal Continue current regimen Counseled on blood pressure goal of less than 130/80, low-sodium, DASH diet, medication compliance, 150 minutes of moderate intensity exercise per week. Discussed medication compliance, adverse effects. - CMP14+EGFR; Future - carvedilol (COREG) 25 MG tablet; Take 1 tablet (25 mg total) by mouth 2 (two) times daily with a meal.  Dispense: 180 tablet; Refill: 1 - losartan (COZAAR) 100 MG tablet; Take 1 tablet (100 mg total) by mouth daily.  Dispense: 90 tablet; Refill: 1 - spironolactone (ALDACTONE) 25 MG tablet; Take 1 tablet (25 mg total) by mouth daily.  Dispense: 90 tablet; Refill: 1  2. Bilateral hip pain Secondary to osteoarthritis - meloxicam (MOBIC) 15 MG tablet; Take 0.5-1 tablets (7.5-15 mg total) by mouth daily as needed for pain.  Dispense: 30 tablet; Refill: 1  3. Biceps tendinitis of left upper extremity Multifactorial I would expect that trauma that occurred during venipuncture 9 months ago would have resolved and she does have superimposed tendinitis especially with lifting her bags on that particular arm Advised to obtain OTC Voltaren gel If symptoms persist consider referring for cortisone injection. - CBC with Differential/Platelet; Future  4. Pure hypercholesterolemia Statin intolerant due to myalgias - LP+Non-HDL Cholesterol; Future - ezetimibe (ZETIA) 10 MG tablet; Take 1 tablet (10 mg total) by mouth daily.  Dispense: 90 tablet; Refill: 1  5. Prediabetes Last A1c was 5.9 Continue to work on lifestyle modifications to prevent progression to type 2 diabetes mellitus. - Hemoglobin A1c; Future  Meds ordered this encounter  Medications   carvedilol (COREG) 25 MG tablet    Sig: Take 1 tablet (25 mg total) by mouth 2 (two) times daily with a meal.     Dispense:  180 tablet    Refill:  1   ezetimibe (ZETIA) 10 MG tablet    Sig: Take 1 tablet (10 mg total) by mouth daily.    Dispense:  90 tablet    Refill:  1   losartan (COZAAR) 100 MG tablet    Sig: Take 1 tablet (100 mg total) by mouth daily.    Dispense:  90 tablet    Refill:  1   meloxicam (MOBIC) 15 MG tablet    Sig: Take 0.5-1 tablets (7.5-15 mg total) by mouth daily as needed for pain.    Dispense:  30 tablet    Refill:  1   spironolactone (ALDACTONE) 25 MG tablet    Sig: Take 1 tablet (25 mg total) by mouth daily.    Dispense:  90 tablet    Refill:  1    Follow-up: Return in about 6 months (around 08/26/2022) for Chronic medical conditions.       Charlott Rakes, MD, FAAFP. [facility] and [facility], [place]   02/25/2022, 4:55 PM

## 2022-02-25 NOTE — Patient Instructions (Signed)
Distal Biceps Tendinitis  Distal biceps tendinitis is inflammation of the distal biceps tendon. This tendon is a strong cord of tissue that connects the biceps muscle on the front of the upper arm to the radius bone in the elbow. Distal biceps tendinitis can interfere with the ability to bend the elbow and to turn the hand palm up (supination). Distal biceps tendinitis may include a grade 1 or grade 2 strain of the tendon. A grade 1 strain is mild. There is a slight pull of the tendon without any stretching or tearing of the tendon. There is also usually no loss of biceps muscle strength. A grade 2 strain is moderate. There is a small tear in the tendon. The tendon is stretched, and biceps muscle strength is usually decreased. This condition is most often caused by overusing the elbow joint and the biceps muscle. It usually heals within 6 weeks. What are the causes? This condition may be caused by: A sudden increase in the frequency or intensity of activity that involves the elbow and the biceps muscle. Overuse of the biceps muscle. This can happen when you do the same movements over and over, such as: Turning your hand palm up. Forceful straightening (hyperextension) of the elbow. Bending of the elbow. A direct, forceful hit or injury (trauma) to the elbow. This is rare. What increases the risk? The following factors may make you more likely to develop this condition: Playing contact sports. Playing sports that involve throwing and overhead movements, including racket sports, gymnastics, weight lifting, or bodybuilding. Doing physical labor. Having poor strength and flexibility of the arm and shoulder. Having injured other parts of the elbow. What are the signs or symptoms? Symptoms of this condition may include: Pain and inflammation in the front of the elbow. Pain may get worse during certain movements, such as: Turning your hand palm up. Bending your elbow. Lifting or carrying  objects. Throwing. A feeling of warmth in the front of your elbow. A feeling of weakness. A crackling sound (crepitation) when you move or touch the elbow or the upper arm. In some cases, symptoms may return (recur) after treatment, and they may be long-lasting (chronic). How is this diagnosed? This condition is diagnosed based on: Your symptoms. Your medical history. A physical exam. Your health care provider may have you do arm movements to test your range of motion. You may have other tests, including: X-rays. MRI. How is this treated? Treatment for this condition depends on the severity of the injury. You may be treated by: Resting the injured arm. Avoid flexing the elbow. Applying ice or heat to the injured area. Taking medicines to relieve pain and inflammation. Ultrasound therapy. This applies sound waves to the injured area. Physical therapy. Follow these instructions at home: Managing pain, stiffness, and swelling     If directed, put ice on the injured area. Put ice in a plastic bag. Place a towel between your skin and the bag. Leave the ice on for 20 minutes, 2-3 times a day. If directed, apply heat to the affected area before you exercise. Use the heat source that your health care provider recommends, such as a moist heat pack or a heating pad. Place a towel between your skin and the heat source. Leave the heat on for 20-30 minutes. Remove the heat if your skin turns bright red. This is especially important if you are unable to feel pain, heat, or cold. You may have a greater risk of getting burned. Move your fingers  often to reduce stiffness and swelling. Raise (elevate) the injured area above the level of your heart while you are sitting or lying down. Activity Do not lift anything that is heavier than 10 lb (4.5 kg), or the limit that you are told, until your health care provider says that it is safe. Avoid activities that cause pain or make your condition  worse. Return to your normal activities as told by your health care provider. Ask your health care provider what activities are safe for you. Do exercises as told by your health care provider. General instructions Take over-the-counter and prescription medicines only as told by your health care provider. Do not use any products that contain nicotine or tobacco, such as cigarettes, e-cigarettes, and chewing tobacco. These can delay healing. If you need help quitting, ask your health care provider. Keep all follow-up visits as told by your health care provider. This is important. How is this prevented? Warm up and stretch before being active. Cool down and stretch after being active. Give your body time to rest between periods of activity. Make sure to use equipment that fits you. Be safe and responsible while being active. This will help you avoid falls. Maintain physical fitness, including: Strength. Flexibility. Heart health (cardiovascular fitness). The ability to use muscles for a long time (endurance). Contact a health care provider if: You have symptoms that get worse or do not get better after 2 weeks of treatment. You develop new symptoms. Get help right away if: You develop severe pain. Summary Distal biceps tendinitis is inflammation of the distal biceps tendon. This injury is caused by a sudden increase in the frequency or intensity of activity or overuse of the biceps muscle. Distal biceps tendinitis can interfere with the ability to bend the elbow and to turn the hand palm up. This condition is treated with rest, ice, heat, physical therapy, and pain medicines if needed. Follow activity and lifting restrictions as told by your health care provider. This information is not intended to replace advice given to you by your health care provider. Make sure you discuss any questions you have with your health care provider. Document Revised: 03/14/2020 Document Reviewed:  03/14/2020 Elsevier Patient Education  [name].

## 2022-02-25 NOTE — Telephone Encounter (Signed)
Requested medication (s) are due for refill today: yes  Requested medication (s) are on the active medication list: yes  Last refill:  05/21/21 #90 1 refill  Future visit scheduled: today   Notes to clinic:  protocol failed last labs 05/21/21. Do you want to refill Rxs?     Requested Prescriptions  Pending Prescriptions Disp Refills   losartan (COZAAR) 100 MG tablet [Pharmacy Med Name: Losartan Potassium 100 MG Oral Tablet] 90 tablet 0    Sig: Take 1 tablet by mouth once daily     Cardiovascular:  Angiotensin Receptor Blockers Failed - 02/25/2022  6:36 AM      Failed - Cr in normal range and within 180 days    Creat  Date Value Ref Range Status  03/27/2016 0.70 0.50 - 1.05 mg/dL Final    Comment:      For patients > or = 66 years of age: The upper reference limit for Creatinine is approximately 13% higher for people identified as African-American.      Creatinine, Ser  Date Value Ref Range Status  05/21/2021 0.95 0.57 - 1.00 mg/dL Final         Failed - K in normal range and within 180 days    Potassium  Date Value Ref Range Status  05/21/2021 4.4 3.5 - 5.2 mmol/L Final         Failed - Last BP in normal range    BP Readings from Last 1 Encounters:  02/21/22 (!) 166/81         Passed - Patient is not pregnant      Passed - Valid encounter within last 6 months    Recent Outpatient Visits           3 months ago Essential hypertension   [place], [place], NP   9 months ago Estrogen deficiency   [place], MD   1 year ago Pure hypercholesterolemia   [place], [place], MD   1 year ago Statin intolerance   [place], Enobong, MD   1 year ago Chronic bilateral low back pain, unspecified whether sciatica present   [facility], [place], NP        Future Appointments             Today Charlott Rakes, MD [place]             ezetimibe (ZETIA) 10 MG tablet [Pharmacy Med Name: Ezetimibe 10 MG Oral Tablet] 90 tablet 0    Sig: Take 1 tablet by mouth once daily     Cardiovascular:  Antilipid - Sterol Transport Inhibitors Failed - 02/25/2022  6:36 AM      Failed - Lipid Panel in normal range within the last 12 months    Cholesterol, Total  Date Value Ref Range Status  05/21/2021 199 100 - 199 mg/dL Final   LDL Chol Calc (NIH)  Date Value Ref Range Status  05/21/2021 125 (H) 0 - 99 mg/dL Final   HDL  Date Value Ref Range Status  05/21/2021 65 >39 mg/dL Final   Triglycerides  Date Value Ref Range Status  05/21/2021 47 0 - 149 mg/dL Final         Passed - AST in normal range and within 360 days  AST  Date Value Ref Range Status  05/21/2021 21 0 - 40 IU/L Final         Passed - ALT in normal range and within 360 days    ALT  Date Value Ref Range Status  05/21/2021 17 0 - 32 IU/L Final         Passed - Patient is not pregnant      Passed - Valid encounter within last 12 months    Recent Outpatient Visits           3 months ago Essential hypertension   [place], Vernia [place], NP   9 months ago Estrogen deficiency   [place] Charlott Rakes, MD   1 year ago Pure hypercholesterolemia   [place], Enobong, MD   1 year ago Statin intolerance   [place], MD   1 year ago Chronic bilateral low back pain, unspecified whether sciatica present   [facility], [place], NP       Future Appointments             Today Charlott Rakes, MD [place]

## 2022-03-03 ENCOUNTER — Ambulatory Visit: Payer: Medicaid Other | Attending: Family Medicine

## 2022-03-03 DIAGNOSIS — M7522 Bicipital tendinitis, left shoulder: Secondary | ICD-10-CM

## 2022-03-03 DIAGNOSIS — E7800 Pure hypercholesterolemia, unspecified: Secondary | ICD-10-CM

## 2022-03-03 DIAGNOSIS — I10 Essential (primary) hypertension: Secondary | ICD-10-CM

## 2022-03-03 DIAGNOSIS — R7303 Prediabetes: Secondary | ICD-10-CM

## 2022-03-04 LAB — CBC WITH DIFFERENTIAL/PLATELET
Basophils Absolute: 0 10*3/uL (ref 0.0–0.2)
Basos: 1 %
EOS (ABSOLUTE): 0.1 10*3/uL (ref 0.0–0.4)
Eos: 2 %
Hematocrit: 38 % (ref 34.0–46.6)
Hemoglobin: 12.7 g/dL (ref 11.1–15.9)
Immature Grans (Abs): 0 10*3/uL (ref 0.0–0.1)
Immature Granulocytes: 0 %
Lymphocytes Absolute: 1 10*3/uL (ref 0.7–3.1)
Lymphs: 25 %
MCH: 27.1 pg (ref 26.6–33.0)
MCHC: 33.4 g/dL (ref 31.5–35.7)
MCV: 81 fL (ref 79–97)
Monocytes Absolute: 0.4 10*3/uL (ref 0.1–0.9)
Monocytes: 9 %
Neutrophils Absolute: 2.5 10*3/uL (ref 1.4–7.0)
Neutrophils: 63 %
Platelets: 224 10*3/uL (ref 150–450)
RBC: 4.68 x10E6/uL (ref 3.77–5.28)
RDW: 12.8 % (ref 11.7–15.4)
WBC: 4 10*3/uL (ref 3.4–10.8)

## 2022-03-04 LAB — HEMOGLOBIN A1C
Est. average glucose Bld gHb Est-mCnc: 126 mg/dL
Hgb A1c MFr Bld: 6 % — ABNORMAL HIGH (ref 4.8–5.6)

## 2022-03-04 LAB — LP+NON-HDL CHOLESTEROL
Cholesterol, Total: 199 mg/dL (ref 100–199)
HDL: 58 mg/dL (ref >39)
LDL Chol Calc (NIH): 132 mg/dL — ABNORMAL HIGH (ref 0–99)
Total Non-HDL-Chol (LDL+VLDL): 141 mg/dL — ABNORMAL HIGH (ref 0–129)
Triglycerides: 46 mg/dL (ref 0–149)
VLDL Cholesterol Cal: 9 mg/dL (ref 5–40)

## 2022-03-04 LAB — CMP14+EGFR
ALT: 10 IU/L (ref 0–32)
AST: 18 IU/L (ref 0–40)
Albumin/Globulin Ratio: 1.3 (ref 1.2–2.2)
Albumin: 4.3 g/dL (ref 3.9–4.9)
Alkaline Phosphatase: 88 IU/L (ref 44–121)
BUN/Creatinine Ratio: 12 (ref 12–28)
BUN: 13 mg/dL (ref 8–27)
Bilirubin Total: 0.3 mg/dL (ref 0.0–1.2)
CO2: 20 mmol/L (ref 20–29)
Calcium: 9.6 mg/dL (ref 8.7–10.3)
Chloride: 106 mmol/L (ref 96–106)
Creatinine, Ser: 1.06 mg/dL — ABNORMAL HIGH (ref 0.57–1.00)
Globulin, Total: 3.4 g/dL (ref 1.5–4.5)
Glucose: 87 mg/dL (ref 70–99)
Potassium: 4.2 mmol/L (ref 3.5–5.2)
Sodium: 143 mmol/L (ref 134–144)
Total Protein: 7.7 g/dL (ref 6.0–8.5)
eGFR: 58 mL/min/{1.73_m2} — ABNORMAL LOW (ref >59)

## 2022-06-15 ENCOUNTER — Ambulatory Visit: Payer: Self-pay | Admitting: *Deleted

## 2022-06-15 NOTE — Telephone Encounter (Signed)
Summary: swelling in both of her feet   Pt stated experiencing swelling in both of her feet, especially her left foot. Going on for about two weeks. Pt requested to speak with a member of clinical staff.  Seeking clinical advice.       Chief Complaint: bilateral feet and ankle swelling  Symptoms: left foot and ankle more swollen than right foot and ankle. Can walk. Itching reported Frequency: 2 weeks  Pertinent Negatives: Patient denies chest pain no difficulty breathing no calf pain. No fever no redness Disposition: [] ED /[] Urgent Care (no appt availability in office) / [] Appointment(In office/virtual)/ []  [facility]/ [] Home Care/ [x] Refused Recommended Disposition /[] [facility]/ []  Follow-up with PCP Additional Notes:   No available appt until 07/01/22. Recommended mobile bus and patient declined. Please advise if earlier appt can be scheduled. Patient would like call back from PCP.      Reason for Disposition  MILD or MODERATE ankle swelling (e.g., can't move joint normally, can't do usual activities) (Exceptions: Itchy, localized swelling; swelling is chronic.)  Answer Assessment - Initial Assessment Questions 1. LOCATION: "Which ankle is swollen?" "Where is the swelling?"     Bilateral feet and ankles swelling left greater than right  2. ONSET: "When did the swelling start?"     2 weeks ago  3. SWELLING: "How bad is the swelling?" Or, "How large is it?" (e.g., mild, moderate, severe; size of localized swelling)    - NONE: No joint swelling.   - LOCALIZED: Localized; small area of puffy or swollen skin (e.g., insect bite, skin irritation).   - MILD: Joint looks or feels mildly swollen or puffy.   - MODERATE: Swollen; interferes with normal activities (e.g., work or school); decreased range of movement; may be limping.   - SEVERE: Very swollen; can't move swollen joint at all; limping a lot or unable to walk.     Can walk wearing soft shoes. 4. PAIN: "Is  there any pain?" If Yes, ask: "How bad is it?" (Scale 1-10; or mild, moderate, severe)   - NONE (0): no pain.   - MILD (1-3): doesn't interfere with normal activities.    - MODERATE (4-7): interferes with normal activities (e.g., work or school) or awakens from sleep, limping.    - SEVERE (8-10): excruciating pain, unable to do any normal activities, unable to walk.       No pain only itching 5. CAUSE: "What do you think caused the ankle swelling?"     Not  sure may be medication  6. OTHER SYMPTOMS: "Do you have any other symptoms?" (e.g., fever, chest pain, difficulty breathing, calf pain)     Bilateral feet ankle swelling .  7. PREGNANCY: "Is there any chance you are pregnant?" "When was your last menstrual period?"     na  Protocols used: Ankle Swelling-A-AH

## 2022-06-15 NOTE — Telephone Encounter (Signed)
Scheduled apt on 06/17/2022 at 1410. Patient aware of appt.

## 2022-06-17 ENCOUNTER — Ambulatory Visit: Payer: 59 | Attending: Family Medicine | Admitting: Family Medicine

## 2022-06-17 VITALS — BP 137/75 | HR 76 | Ht 68.0 in | Wt 226.4 lb

## 2022-06-17 DIAGNOSIS — Z1231 Encounter for screening mammogram for malignant neoplasm of breast: Secondary | ICD-10-CM | POA: Diagnosis not present

## 2022-06-17 DIAGNOSIS — M25551 Pain in right hip: Secondary | ICD-10-CM | POA: Diagnosis not present

## 2022-06-17 DIAGNOSIS — R600 Localized edema: Secondary | ICD-10-CM

## 2022-06-17 DIAGNOSIS — E7800 Pure hypercholesterolemia, unspecified: Secondary | ICD-10-CM

## 2022-06-17 DIAGNOSIS — E2839 Other primary ovarian failure: Secondary | ICD-10-CM | POA: Diagnosis not present

## 2022-06-17 DIAGNOSIS — M25552 Pain in left hip: Secondary | ICD-10-CM

## 2022-06-17 DIAGNOSIS — I10 Essential (primary) hypertension: Secondary | ICD-10-CM

## 2022-06-17 MED ORDER — SPIRONOLACTONE 25 MG PO TABS: 25.0000 mg | ORAL_TABLET | Freq: Every day | ORAL | 1 refills | Status: DC

## 2022-06-17 MED ORDER — MELOXICAM 15 MG PO TABS: 7.5000 mg | ORAL_TABLET | Freq: Every day | ORAL | 1 refills | Status: DC | PRN

## 2022-06-17 MED ORDER — LOSARTAN POTASSIUM 100 MG PO TABS: 100.0000 mg | ORAL_TABLET | Freq: Every day | ORAL | 1 refills | Status: DC

## 2022-06-17 MED ORDER — EZETIMIBE 10 MG PO TABS: 10.0000 mg | ORAL_TABLET | Freq: Every day | ORAL | 1 refills | Status: DC

## 2022-06-17 MED ORDER — CARVEDILOL 25 MG PO TABS: 25.0000 mg | ORAL_TABLET | Freq: Two times a day (BID) | ORAL | 1 refills | Status: DC

## 2022-06-17 MED ORDER — FUROSEMIDE 20 MG PO TABS: 20.0000 mg | ORAL_TABLET | Freq: Every day | ORAL | 3 refills | Status: AC | PRN

## 2022-06-17 NOTE — Progress Notes (Signed)
Subjective:  Patient ID: Kaylee Nelson, female    DOB: 05-02-1955  Age: 67 y.o. MRN: [id number]  CC: Foot Swelling   HPI Brandalyn Keaunna Delbosque is a 67 y.o. 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 She has been taking Sea moss pills to help with her BP. Edema does not resolve when she elevates her legs or wakes up in the morning. Denies presence of dyspnea or chest pains. Endorses sitting a lot at her desk.  Her blood pressure is elevated and she endorses adherence with her antihypertensive.  She has been unable to tolerate a statin due to myalgia but has been on Zetia and a low-cholesterol diet. Outside of work she does not get much exercise but is always moving at home. Her chronic knee pain is stable on meloxicam. Past Medical History:  Diagnosis Date   Arthritis    bilateral knees   Blood transfusion without reported diagnosis 2007   Heart murmur    as a child   Hypertension    on meds   Phlegm in throat    LAST WEEK OR TWO NO FEVER   Sickle cell trait (HCC)    on preventative iron po meds    Past Surgical History:  Procedure Laterality Date   ABDOMINAL HYSTERECTOMY  2007   ADRENALECTOMY N/A 08/18/2018   Procedure: OPEN LEFT ADRENALECTOMY;  Surgeon: Darnell Level, MD;  Location: [facility];  Service: General;  Laterality: N/A;   CESAREAN SECTION  1982   WISDOM TOOTH EXTRACTION  2017    Family History  Problem Relation Age of Onset   Cancer Mother    Breast cancer Mother    Cancer Maternal Aunt    Breast cancer Maternal Aunt    Cancer Daughter    Breast cancer Daughter    BRCA 1/2 Cousin    Colon polyps Neg Hx    Colon cancer Neg Hx    Esophageal cancer Neg Hx    Rectal cancer Neg Hx    Stomach cancer Neg Hx     Social History   Socioeconomic History    Marital status: Single    Spouse name: Not on file   Number of children: Not on file   Years of education: Not on file   Highest education level: Not on file  Occupational History   Not on file  Tobacco Use   Smoking status: Never   Smokeless tobacco: Never  Vaping Use   Vaping Use: Never used  Substance and Sexual Activity   Alcohol use: No    Alcohol/week: 0.0 standard drinks of alcohol   Drug use: No   Sexual activity: Never  Other Topics Concern   Not on file  Social History Narrative   Not on file   Social Determinants of Health   Financial Resource Strain: Not on file  Food Insecurity: Not on file  Transportation Needs: Not on file  Physical Activity: Not on file  Stress: Not on file  Social Connections: Not on file    Allergies  Allergen Reactions   Shrimp (Diagnostic) Hives, Itching, Swelling and Rash   Sulfa Antibiotics Hives, Itching and Rash   Shrimp [Shellfish Allergy]     Outpatient Medications Prior to Visit  Medication Sig Dispense Refill   Artificial Tear Ointment (DRY EYES OP) Apply 2  drops to eye daily at 6 (six) AM.     CALCIUM MAGNESIUM ZINC PO Take 1 tablet by mouth daily at 6 (six) AM.     CALCIUM PO Take 600 mg by mouth daily at 6 (six) AM.     Cyanocobalamin (VITAMIN B-12 PO) Take 5,000 mcg by mouth daily at 6 (six) AM.     Ferrous Sulfate (IRON PO) Take 18 mg by mouth daily at 6 (six) AM.     carvedilol (COREG) 25 MG tablet Take 1 tablet (25 mg total) by mouth 2 (two) times daily with a meal. 180 tablet 1   ezetimibe (ZETIA) 10 MG tablet Take 1 tablet (10 mg total) by mouth daily. 90 tablet 1   losartan (COZAAR) 100 MG tablet Take 1 tablet (100 mg total) by mouth daily. 90 tablet 1   meloxicam (MOBIC) 15 MG tablet Take 0.5-1 tablets (7.5-15 mg total) by mouth daily as needed for pain. 30 tablet 1   spironolactone (ALDACTONE) 25 MG tablet Take 1 tablet (25 mg total) by mouth daily. 90 tablet 1   PEG-KCl-NaCl-NaSulf-Na Asc-C (PLENVU) 140 g  SOLR Take 1 kit by mouth as directed. Generic okay-please use SingleCare or GoodRx for price if able; (Patient not taking: Reported on 02/25/2022) 1 each 0   traMADol (ULTRAM) 50 MG tablet Take 1 tablet (50 mg total) by mouth every 6 (six) hours as needed. (Patient not taking: Reported on 06/17/2022) 15 tablet 0   Facility-Administered Medications Prior to Visit  Medication Dose Route Frequency Provider Last Rate Last Admin   0.9 %  sodium chloride infusion  500 mL Intravenous Continuous Imogene Burn, MD         ROS Review of Systems  Constitutional:  Negative for activity change and appetite change.  HENT:  Negative for sinus pressure and sore throat.   Respiratory:  Negative for chest tightness, shortness of breath and wheezing.   Cardiovascular:  Positive for leg swelling. Negative for chest pain and palpitations.  Gastrointestinal:  Negative for abdominal distention, abdominal pain and constipation.  Genitourinary: Negative.   Musculoskeletal: Negative.   Psychiatric/Behavioral:  Negative for behavioral problems and dysphoric mood.     Objective:  BP 137/75   Pulse 76   Ht 5\' 8"  (1.727 m)   Wt 226 lb 6.4 oz (102.7 kg)   SpO2 99%   BMI 34.42 kg/m      06/17/2022    2:42 PM 06/17/2022    2:21 PM 02/25/2022    4:15 PM  BP/Weight  Systolic BP 137 158 139  Diastolic BP 75 76 72  Wt. (Lbs)  226.4 223  BMI  34.42 kg/m2 33.91 kg/m2   Wt Readings from Last 3 Encounters:  06/17/22 226 lb 6.4 oz (102.7 kg)  02/25/22 223 lb (101.2 kg)  01/16/22 226 lb (102.5 kg)      Physical Exam Constitutional:      Appearance: She is well-developed.  Cardiovascular:     Rate and Rhythm: Normal rate.     Heart sounds: Normal heart sounds. No murmur heard. Pulmonary:     Effort: Pulmonary effort is normal.     Breath sounds: Normal breath sounds. No wheezing or rales.  Chest:     Chest wall: No tenderness.  Abdominal:     General: Bowel sounds are normal. There is no distension.      Palpations: Abdomen is soft. There is no mass.     Tenderness: There is no abdominal tenderness.  Musculoskeletal:  General: Normal range of motion.     Right lower leg: Edema (1+ pitting) present.     Left lower leg: Edema (1+ pitting) present.  Neurological:     Mental Status: She is alert and oriented to person, place, and time.  Psychiatric:        Mood and Affect: Mood normal.        Latest Ref Rng & Units 03/03/2022    9:36 AM 05/21/2021   12:09 PM 07/25/2020    9:09 AM  CMP  Glucose 70 - 99 mg/dL 87  782  97   BUN 8 - 27 mg/dL 13  7  11    Creatinine 0.57 - 1.00 mg/dL 9.56  2.13  0.86   Sodium 134 - 144 mmol/L 143  145  144   Potassium 3.5 - 5.2 mmol/L 4.2  4.4  4.7   Chloride 96 - 106 mmol/L 106  108  108   CO2 20 - 29 mmol/L 20  22  25    Calcium 8.7 - 10.3 mg/dL 9.6  9.7  9.4   Total Protein 6.0 - 8.5 g/dL 7.7  7.1  6.8   Total Bilirubin 0.0 - 1.2 mg/dL 0.3  0.4  0.2   Alkaline Phos 44 - 121 IU/L 88  102  82   AST 0 - 40 IU/L 18  21  12    ALT 0 - 32 IU/L 10  17  12      Lipid Panel     Component Value Date/Time   CHOL 199 03/03/2022 0936   TRIG 46 03/03/2022 0936   HDL 58 03/03/2022 0936   CHOLHDL 3.6 07/25/2020 0909   CHOLHDL 2.8 12/17/2015 1035   VLDL 10 12/17/2015 1035   LDLCALC 132 (H) 03/03/2022 0936    CBC    Component Value Date/Time   WBC 4.0 03/03/2022 0936   WBC 9.7 08/23/2018 1255   RBC 4.68 03/03/2022 0936   RBC 3.59 (L) 08/23/2018 1255   HGB 12.7 03/03/2022 0936   HCT 38.0 03/03/2022 0936   PLT 224 03/03/2022 0936   MCV 81 03/03/2022 0936   MCH 27.1 03/03/2022 0936   MCH 28.7 08/23/2018 1255   MCHC 33.4 03/03/2022 0936   MCHC 32.2 08/23/2018 1255   RDW 12.8 03/03/2022 0936   LYMPHSABS 1.0 03/03/2022 0936   MONOABS 1.2 (H) 08/20/2018 0755   EOSABS 0.1 03/03/2022 0936   BASOSABS 0.0 03/03/2022 0936    Lab Results  Component Value Date   HGBA1C 6.0 (H) 03/03/2022    Assessment & Plan:  1. Essential hypertension Slightly  above goal Due to pedal edema will add on Lasix as needed Counseled on blood pressure goal of less than 130/80, low-sodium, DASH diet, medication compliance, 150 minutes of moderate intensity exercise per week. Discussed medication compliance, adverse effects. - CMP14+EGFR - carvedilol (COREG) 25 MG tablet; Take 1 tablet (25 mg total) by mouth 2 (two) times daily with a meal.  Dispense: 180 tablet; Refill: 1 - losartan (COZAAR) 100 MG tablet; Take 1 tablet (100 mg total) by mouth daily.  Dispense: 90 tablet; Refill: 1 - spironolactone (ALDACTONE) 25 MG tablet; Take 1 tablet (25 mg total) by mouth daily.  Dispense: 90 tablet; Refill: 1  2. Pedal edema She has no symptoms suggestive of cardiac disease Likely dependent edema - Pro b natriuretic peptide - furosemide (LASIX) 20 MG tablet; Take 1 tablet (20 mg total) by mouth daily as needed. For pedal edema  Dispense: 30 tablet; Refill:  3  3. Estrogen deficiency - DG Bone Density; Future  4. 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  Dispense: 90 tablet; Refill: 1  5. Bilateral hip pain Secondary to osteoarthritis Continue meloxicam - meloxicam (MOBIC) 15 MG tablet; Take 0.5-1 tablets (7.5-15 mg total) by mouth daily as needed for pain.  Dispense: 30 tablet; Refill: 1  6.  Encounter for screening for malignant neoplasm of breast Mammogram order placed  Health Care Maintenance: Due for mammogram and order has been placed. Meds ordered this encounter  Medications   furosemide (LASIX) 20 MG tablet    Sig: Take 1 tablet (20 mg total) by mouth daily as needed. For pedal edema    Dispense:  30 tablet    Refill:  3   carvedilol (COREG) 25 MG tablet    Sig: Take 1 tablet (25 mg total) by mouth 2 (two) times daily with a meal.    Dispense:  180 tablet    Refill:  1   ezetimibe  (ZETIA) 10 MG tablet    Sig: Take 1 tablet (10 mg total) by mouth daily.    Dispense:  90 tablet    Refill:  1   losartan (COZAAR) 100 MG tablet    Sig: Take 1 tablet (100 mg total) by mouth daily.    Dispense:  90 tablet    Refill:  1   meloxicam (MOBIC) 15 MG tablet    Sig: Take 0.5-1 tablets (7.5-15 mg total) by mouth daily as needed for pain.    Dispense:  30 tablet    Refill:  1   spironolactone (ALDACTONE) 25 MG tablet    Sig: Take 1 tablet (25 mg total) by mouth daily.    Dispense:  90 tablet    Refill:  1    Follow-up: Return in about 6 months (around 12/18/2022) for Chronic medical conditions.       Hoy Register, MD, FAAFP. [facility] [place], [place] [phone]   06/17/2022, 3:36 PM

## 2022-06-17 NOTE — Progress Notes (Signed)
Swelling in feet. 

## 2022-06-17 NOTE — Patient Instructions (Signed)
Edema ? ?Edema is when you have too much fluid in your body or under your skin. Edema may make your legs, feet, and ankles swell. Swelling often happens in looser tissues, such as around your eyes. This is a common condition. It gets more common as you get older. ?There are many possible causes of edema. These include: ?Eating too much salt (sodium). ?Being on your feet or sitting for a long time. ?Certain medical conditions, such as: ?Pregnancy. ?Heart failure. ?Liver disease. ?Kidney disease. ?Cancer. ?Hot weather may make edema worse. Edema is usually painless. Your skin may look swollen or shiny. ?Follow these instructions at home: ?Medicines ?Take over-the-counter and prescription medicines only as told by your doctor. ?Your doctor may prescribe a medicine to help your body get rid of extra water (diuretic). Take this medicine if you are told to take it. ?Eating and drinking ?Eat a low-salt (low-sodium) diet as told by your doctor. Sometimes, eating less salt may reduce swelling. ?Depending on the cause of your swelling, you may need to limit how much fluid you drink (fluid restriction). ?General instructions ?Raise the injured area above the level of your heart while you are sitting or lying down. ?Do not sit still or stand for a long time. ?Do not wear tight clothes. Do not wear garters on your upper legs. ?Exercise your legs. This can help the swelling go down. ?Wear compression stockings as told by your doctor. It is important that these are the right size. These should be prescribed by your doctor to prevent possible injuries. ?If elastic bandages or wraps are recommended, use them as told by your doctor. ?Contact a doctor if: ?Treatment is not working. ?You have heart, liver, or kidney disease and have symptoms of edema. ?You have sudden and unexplained weight gain. ?Get help right away if: ?You have shortness of breath or chest pain. ?You cannot breathe when you lie down. ?You have pain, redness, or  warmth in the swollen areas. ?You have heart, liver, or kidney disease and get edema all of a sudden. ?You have a fever and your symptoms get worse all of a sudden. ?These symptoms may be an emergency. Get help right away. Call 911. ?Do not wait to see if the symptoms will go away. ?Do not drive yourself to the hospital. ?Summary ?Edema is when you have too much fluid in your body or under your skin. ?Edema may make your legs, feet, and ankles swell. Swelling often happens in looser tissues, such as around your eyes. ?Raise the injured area above the level of your heart while you are sitting or lying down. ?Follow your doctor's instructions about diet and how much fluid you can drink. ?This information is not intended to replace advice given to you by your health care provider. Make sure you discuss any questions you have with your health care provider. ?Document Revised: 09/23/2020 Document Reviewed: 09/23/2020 ?Elsevier Patient Education ? 2023 Elsevier Inc. ? ?

## 2022-06-18 LAB — CMP14+EGFR
ALT: 12 IU/L (ref 0–32)
AST: 13 IU/L (ref 0–40)
Albumin/Globulin Ratio: 1.5 (ref 1.2–2.2)
Albumin: 4.1 g/dL (ref 3.9–4.9)
Alkaline Phosphatase: 91 IU/L (ref 44–121)
BUN/Creatinine Ratio: 10 — ABNORMAL LOW (ref 12–28)
BUN: 9 mg/dL (ref 8–27)
Bilirubin Total: 0.3 mg/dL (ref 0.0–1.2)
CO2: 23 mmol/L (ref 20–29)
Calcium: 9.5 mg/dL (ref 8.7–10.3)
Chloride: 110 mmol/L — ABNORMAL HIGH (ref 96–106)
Creatinine, Ser: 0.94 mg/dL (ref 0.57–1.00)
Globulin, Total: 2.7 g/dL (ref 1.5–4.5)
Glucose: 109 mg/dL — ABNORMAL HIGH (ref 70–99)
Potassium: 4.3 mmol/L (ref 3.5–5.2)
Sodium: 146 mmol/L — ABNORMAL HIGH (ref 134–144)
Total Protein: 6.8 g/dL (ref 6.0–8.5)
eGFR: 67 mL/min/{1.73_m2} (ref >59)

## 2022-06-18 LAB — PRO B NATRIURETIC PEPTIDE: NT-Pro BNP: 73 pg/mL (ref 0–301)

## 2022-07-24 ENCOUNTER — Telehealth: Payer: Self-pay | Admitting: Family Medicine

## 2022-07-24 NOTE — Telephone Encounter (Signed)
Pt is calling back because she hasn't received a call regarding her issue. Please advise.

## 2022-07-24 NOTE — Telephone Encounter (Signed)
Spoke with patient . Verified name & DOB   Patient voiced that she is having painful urination. Advised patient to got to UC to be assessed and to ensure that she does have a UTI. Patient is agreeable to go to [facility]

## 2022-07-24 NOTE — Telephone Encounter (Signed)
Pt is calling in requesting to speak with either Dr. Alvis Lemmings or her nurse. Pt thinks she has a UTI.

## 2022-08-26 ENCOUNTER — Ambulatory Visit: Payer: Medicaid Other | Admitting: Family Medicine

## 2022-09-09 ENCOUNTER — Other Ambulatory Visit: Payer: Self-pay | Admitting: Family Medicine

## 2022-09-09 DIAGNOSIS — E7800 Pure hypercholesterolemia, unspecified: Secondary | ICD-10-CM

## 2022-09-09 DIAGNOSIS — I10 Essential (primary) hypertension: Secondary | ICD-10-CM

## 2022-09-09 NOTE — Telephone Encounter (Unsigned)
Copied from CRM [id number]. Topic: General - Other >> Sep 09, 2022  9:17 AM Everette C wrote: Reason for CRM: Medication Refill - Medication: losartan (COZAAR) 100 MG tablet [[id number]]  ezetimibe (ZETIA) 10 MG tablet [[id number]]  Has the patient contacted their pharmacy? Yes.   (Agent: If no, request that the patient contact the pharmacy for the refill. If patient does not wish to contact the pharmacy document the reason why and proceed with request.) (Agent: If yes, when and what did the pharmacy advise?)  Preferred Pharmacy (with phone number or street name): [facility] [address], [place] - [address] [place] [address] Phone: [phone] Fax: [phone] Hours: Not open 24 hours   Has the patient been seen for an appointment in the last year OR does the patient have an upcoming appointment? Yes.    Agent: Please be advised that RX refills may take up to 3 business days. We ask that you follow-up with your pharmacy.

## 2022-09-10 NOTE — Telephone Encounter (Signed)
Requests are too soon for refill. Last refill 06/17/22 for 90 and 1 refill.E-Prescribing Status: Receipt confirmed by pharmacy (06/17/2022  2:43 PM EDT).  Requested Prescriptions  Pending Prescriptions Disp Refills   losartan (COZAAR) 100 MG tablet 90 tablet 1    Sig: Take 1 tablet (100 mg total) by mouth daily.     Cardiovascular:  Angiotensin Receptor Blockers Passed - 09/09/2022  9:51 AM      Passed - Cr in normal range and within 180 days    Creat  Date Value Ref Range Status  03/27/2016 0.70 0.50 - 1.05 mg/dL Final    Comment:      For patients > or = 66 years of age: The upper reference limit for Creatinine is approximately 13% higher for people identified as African-American.      Creatinine, Ser  Date Value Ref Range Status  06/17/2022 0.94 0.57 - 1.00 mg/dL Final         Passed - K in normal range and within 180 days    Potassium  Date Value Ref Range Status  06/17/2022 4.3 3.5 - 5.2 mmol/L Final         Passed - Patient is not pregnant      Passed - Last BP in normal range    BP Readings from Last 1 Encounters:  06/17/22 137/75         Passed - Valid encounter within last 6 months    Recent Outpatient Visits           2 months ago Essential hypertension   [place] [facility] [place], Odette Horns, MD   6 months ago Biceps tendinitis of left upper extremity   [place] [facility] Hoy Register, MD   9 months ago Essential hypertension   [place] [facility] [place], Shea Stakes, NP   1 year ago Estrogen deficiency   [place] [facility] Hoy Register, MD   1 year ago Pure hypercholesterolemia   [place] [facility] [place], Odette Horns, MD       Future Appointments             In 3 months Hoy Register, MD [place] [facility]             ezetimibe (ZETIA) 10 MG tablet 90 tablet 1    Sig: Take 1  tablet (10 mg total) by mouth daily.     Cardiovascular:  Antilipid - Sterol Transport Inhibitors Failed - 09/09/2022  9:51 AM      Failed - Lipid Panel in normal range within the last 12 months    Cholesterol, Total  Date Value Ref Range Status  03/03/2022 199 100 - 199 mg/dL Final   LDL Chol Calc (NIH)  Date Value Ref Range Status  03/03/2022 132 (H) 0 - 99 mg/dL Final   HDL  Date Value Ref Range Status  03/03/2022 58 >39 mg/dL Final   Triglycerides  Date Value Ref Range Status  03/03/2022 46 0 - 149 mg/dL Final         Passed - AST in normal range and within 360 days    AST  Date Value Ref Range Status  06/17/2022 13 0 - 40 IU/L Final         Passed - ALT in normal range and within 360 days    ALT  Date Value Ref Range Status  06/17/2022  12 0 - 32 IU/L Final         Passed - Patient is not pregnant      Passed - Valid encounter within last 12 months    Recent Outpatient Visits           2 months ago Essential hypertension   [place] [facility] [place], Odette Horns, MD   6 months ago Biceps tendinitis of left upper extremity   [place] [facility] Hoy Register, MD   9 months ago Essential hypertension   [place] [facility] [place], Shea Stakes, NP   1 year ago Estrogen deficiency   [place] [facility] Hoy Register, MD   1 year ago Pure hypercholesterolemia   [place] [facility] Hoy Register, MD       Future Appointments             In 3 months Hoy Register, MD [facility]

## 2022-09-14 ENCOUNTER — Telehealth: Payer: Self-pay | Admitting: Family Medicine

## 2022-09-14 NOTE — Telephone Encounter (Signed)
[facility] called and spoke to [place], Pensions consultant about the refill(s) losartan requested. Advised it was sent on 06/17/22 #90/1 refill(s). She says it's been there ready for pickup since 09/09/22. Patient called and advised of the above. She says she must have missed the alert. Advised to verify they have the right phone number to receive the text alerts when she picks up the losartan, she verbalized understanding.

## 2022-09-14 NOTE — Telephone Encounter (Signed)
Medication Refill - Medication: losartan (COZAAR) 100 MG tablet  This was denied saying refiill too soon, Pt is all out of this med for a week now  Has the patient contacted their pharmacy? yes (Agent: If no, request that the patient contact the pharmacy for the refill. If patient does not wish to contact the pharmacy document the reason why and proceed with request.) (Agent: If yes, when and what did the pharmacy advise?)contact pcp  Preferred Pharmacy (with phone number or street name):  [facility] [address] [place], [place] - [address] Phone: [phone]  Fax: [phone]     Has the patient been seen for an appointment in the last year OR does the patient have an upcoming appointment? yes  Agent: Please be advised that RX refills may take up to 3 business days. We ask that you follow-up with your pharmacy.

## 2022-09-25 ENCOUNTER — Ambulatory Visit
Admission: RE | Admit: 2022-09-25 | Discharge: 2022-09-25 | Disposition: A | Discharge status: Home / Self Care | Payer: Medicaid Other | Source: Ambulatory Visit | Attending: Family Medicine | Admitting: Family Medicine

## 2022-09-25 DIAGNOSIS — Z1231 Encounter for screening mammogram for malignant neoplasm of breast: Secondary | ICD-10-CM | POA: Diagnosis not present

## 2022-11-17 ENCOUNTER — Encounter: Payer: Self-pay | Admitting: Internal Medicine

## 2022-11-17 ENCOUNTER — Ambulatory Visit: Payer: 59 | Attending: Internal Medicine | Admitting: Internal Medicine

## 2022-11-17 VITALS — BP 152/87 | HR 71 | Temp 98.6°F | Ht 68.0 in | Wt 228.3 lb

## 2022-11-17 DIAGNOSIS — M170 Bilateral primary osteoarthritis of knee: Secondary | ICD-10-CM

## 2022-11-17 DIAGNOSIS — R7303 Prediabetes: Secondary | ICD-10-CM | POA: Diagnosis not present

## 2022-11-17 DIAGNOSIS — I10 Essential (primary) hypertension: Secondary | ICD-10-CM

## 2022-11-17 LAB — POCT GLYCOSYLATED HEMOGLOBIN (HGB A1C): Hemoglobin A1C: 5.9 % — AB (ref 4.0–5.6)

## 2022-11-17 MED ORDER — SPIRONOLACTONE 25 MG PO TABS
25.0000 mg | ORAL_TABLET | Freq: Every day | ORAL | 1 refills | Status: DC
Start: 2022-11-17 — End: 2022-12-21

## 2022-11-17 NOTE — Patient Instructions (Signed)
Keep record of your blood pressure and bring readings on next visit.  Prediabetes Eating Plan Prediabetes is a condition that causes blood sugar (glucose) levels to be higher than normal. This increases the risk for developing type 2 diabetes (type 2 diabetes mellitus). Working with a health care provider or nutrition specialist (dietitian) to make diet and lifestyle changes can help prevent the onset of diabetes. These changes may help you: Control your blood glucose levels. Improve your cholesterol levels. Manage your blood pressure. What are tips for following this plan? Reading food labels Read food labels to check the amount of fat, salt (sodium), and sugar in prepackaged foods. Avoid foods that have: Saturated fats. Trans fats. Added sugars. Avoid foods that have more than 300 milligrams (mg) of sodium per serving. Limit your sodium intake to less than 2,300 mg each day. Shopping Avoid buying pre-made and processed foods. Avoid buying drinks with added sugar. Cooking Cook with olive oil. Do not use butter, lard, or ghee. Bake, broil, grill, steam, or boil foods. Avoid frying. Meal planning  Work with your dietitian to create an eating plan that is right for you. This may include tracking how many calories you take in each day. Use a food diary, notebook, or mobile application to track what you eat at each meal. Consider following a Mediterranean diet. This includes: Eating several servings of fresh fruits and vegetables each day. Eating fish at least twice a week. Eating one serving each day of whole grains, beans, nuts, and seeds. Using olive oil instead of other fats. Limiting alcohol. Limiting red meat. Using nonfat or low-fat dairy products. Consider following a plant-based diet. This includes dietary choices that focus on eating mostly vegetables and fruit, grains, beans, nuts, and seeds. If you have high blood pressure, you may need to limit your sodium intake or follow a  diet such as the DASH (Dietary Approaches to Stop Hypertension) eating plan. The DASH diet aims to lower high blood pressure. Lifestyle Set weight loss goals with help from your health care team. It is recommended that most people with prediabetes lose 7% of their body weight. Exercise for at least 30 minutes 5 or more days a week. Attend a support group or seek support from a mental health counselor. Take over-the-counter and prescription medicines only as told by your health care provider. What foods are recommended? Fruits Berries. Bananas. Apples. Oranges. Grapes. Papaya. Mango. Pomegranate. Kiwi. Grapefruit. Cherries. Vegetables Lettuce. Spinach. Peas. Beets. Cauliflower. Cabbage. Broccoli. Carrots. Tomatoes. Squash. Eggplant. Herbs. Peppers. Onions. Cucumbers. Brussels sprouts. Grains Whole grains, such as whole-wheat or whole-grain breads, crackers, cereals, and pasta. Unsweetened oatmeal. Bulgur. Barley. Quinoa. Brown rice. Corn or whole-wheat flour tortillas or taco shells. Meats and other proteins Seafood. Poultry without skin. Lean cuts of pork and beef. Tofu. Eggs. Nuts. Beans. Dairy Low-fat or fat-free dairy products, such as yogurt, cottage cheese, and cheese. Beverages Water. Tea. Coffee. Sugar-free or diet soda. Seltzer water. Low-fat or nonfat milk. Milk alternatives, such as soy or almond milk. Fats and oils Olive oil. Canola oil. Sunflower oil. Grapeseed oil. Avocado. Walnuts. Sweets and desserts Sugar-free or low-fat pudding. Sugar-free or low-fat ice cream and other frozen treats. Seasonings and condiments Herbs. Sodium-free spices. Mustard. Relish. Low-salt, low-sugar ketchup. Low-salt, low-sugar barbecue sauce. Low-fat or fat-free mayonnaise. The items listed above may not be a complete list of recommended foods and beverages. Contact a dietitian for more information. What foods are not recommended? Fruits Fruits canned with syrup. Vegetables Canned vegetables.  Frozen vegetables with butter or cream sauce. Grains Refined white flour and flour products, such as bread, pasta, snack foods, and cereals. Meats and other proteins Fatty cuts of meat. Poultry with skin. Breaded or fried meat. Processed meats. Dairy Full-fat yogurt, cheese, or milk. Beverages Sweetened drinks, such as iced tea and soda. Fats and oils Butter. Lard. Ghee. Sweets and desserts Baked goods, such as cake, cupcakes, pastries, cookies, and cheesecake. Seasonings and condiments Spice mixes with added salt. Ketchup. Barbecue sauce. Mayonnaise. The items listed above may not be a complete list of foods and beverages that are not recommended. Contact a dietitian for more information. Where to find more information American Diabetes Association: www.diabetes.org Summary You may need to make diet and lifestyle changes to help prevent the onset of diabetes. These changes can help you control blood sugar, improve cholesterol levels, and manage blood pressure. Set weight loss goals with help from your health care team. It is recommended that most people with prediabetes lose 7% of their body weight. Consider following a Mediterranean diet. This includes eating plenty of fresh fruits and vegetables, whole grains, beans, nuts, seeds, fish, and low-fat dairy, and using olive oil instead of other fats. This information is not intended to replace advice given to you by your health care provider. Make sure you discuss any questions you have with your health care provider. Document Revised: 04/20/2019 Document Reviewed: 04/20/2019 Elsevier Patient Education  2024 [employer].

## 2022-11-17 NOTE — Progress Notes (Signed)
Patient ID: Kaylee Nelson, female    DOB: December 27, 1955  MRN: [id number]  CC: BL knee pain   Subjective: Kaylee Nelson is a 67 y.o. 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 Has been taking Advil BID (last took 4 days ago), Voltaren Gel, heating pad, iced and elevation.  She sustained a small burn on the lateral posterior aspect of the right knee from using the heating pad after putting Voltaren gel on the knee. Sleeps with pillow b/w legs Has not been taking Meloxicam which is on her medication list. Last week the pain was 10/10.  This week it has decreased to 4/10. Patient with known osteoarthritis of both knees with last imaging studies done 06/2020. Blood pressure noted to be elevated today.  She thinks it is due to her being on Advil most of last week.  Normally blood pressure at home runs low 130s/80.  A1c was checked today by RN.  Level came back at 5.9.  Patient states she was not aware that she had prediabetes.  Last A1c was 8 months ago and was 6. Patient Active Problem List   Diagnosis Date Noted  . 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 Pedal edema 10/12/2016  . Seasonal allergies 05/19/2016  . Hyperlipidemia 12/18/2015  . Essential hypertension 01/10/2014     Current Outpatient Medications on File Prior to Visit  Medication Sig Dispense Refill  . Artificial Tear Ointment (DRY EYES OP) Apply 2 drops to eye daily at 6 (six) AM.    . CALCIUM MAGNESIUM ZINC PO Take 1 tablet by mouth daily at 6 (six) AM.    . CALCIUM PO Take 600 mg by mouth daily at 6 (six) AM.    . carvedilol (COREG) 25 MG tablet Take 1 tablet (25 mg total) by mouth 2 (two) times daily with a  meal. 180 tablet 1  . Cyanocobalamin (VITAMIN B-12 PO) Take 5,000 mcg by mouth daily at 6 (six) AM.    . ezetimibe (ZETIA) 10 MG tablet Take 1 tablet (10 mg total) by mouth daily. 90 tablet 1  . Ferrous Sulfate (IRON PO) Take 18 mg by mouth daily at 6 (six) AM.    . furosemide (LASIX) 20 MG tablet Take 1 tablet (20 mg total) by mouth daily as needed. For pedal edema 30 tablet 3  . losartan (COZAAR) 100 MG tablet Take 1 tablet (100 mg total) by mouth daily. 90 tablet 1  . meloxicam (MOBIC) 15 MG tablet Take 0.5-1 tablets (7.5-15 mg total) by mouth daily as needed for pain. 30 tablet 1  . PEG-KCl-NaCl-NaSulf-Na Asc-C (PLENVU) 140 g SOLR Take 1 kit by mouth as directed. Generic okay-please use SingleCare or GoodRx for price if able; (Patient not taking: Reported on 02/25/2022) 1 each 0  . traMADol (ULTRAM) 50 MG tablet Take 1 tablet (50 mg total) by mouth every 6 (six) hours as needed. (Patient not taking: Reported on 06/17/2022) 15 tablet 0   Current Facility-Administered Medications on File Prior to Visit  Medication Dose Route Frequency Provider Last Rate Last Admin  . 0.9 %  sodium chloride infusion  500 mL Intravenous Continuous Imogene Burn, MD  Allergies  Allergen Reactions  . Shrimp (Diagnostic) Hives, Itching, Swelling and Rash  . Sulfa Antibiotics Hives, Itching and Rash  . Shrimp [Shellfish Allergy]     Social History   Socioeconomic History  . Marital status: Single    Spouse name: Not on file  . Number of children: Not on file  . Years of education: Not on file  . Highest education level: Not on file  Occupational History  . Not on file  Tobacco Use  . Smoking status: Never  . Smokeless tobacco: Never  Vaping Use  . Vaping status: Never Used  Substance and Sexual Activity  . Alcohol use: No    Alcohol/week: 0.0 standard drinks of alcohol  . Drug use: No  . Sexual activity: Never  Other Topics Concern  . Not on file  Social History Narrative  . Not on file    Social Determinants of Health   Financial Resource Strain: Not on file  Food Insecurity: Not on file  Transportation Needs: Not on file  Physical Activity: Not on file  Stress: Not on file  Social Connections: Unknown (06/17/2021)   [facility]   Social Network   . Social Network: Not on file  Intimate Partner Violence: Unknown (05/09/2021)   [facility]   HITS   . Physically Hurt: Not on file   . Insult or Talk Down To: Not on file   . Threaten Physical Harm: Not on file   . Scream or Curse: Not on file    Family History  Problem Relation Age of Onset  . Cancer Mother   . Breast cancer Mother   . Cancer Maternal Aunt   . Breast cancer Maternal Aunt   . Cancer Daughter   . Breast cancer Daughter   . BRCA 1/2 Cousin   . Colon polyps Neg Hx   . Colon cancer Neg Hx   . Esophageal cancer Neg Hx   . Rectal cancer Neg Hx   . Stomach cancer Neg Hx     Past Surgical History:  Procedure Laterality Date  . ABDOMINAL HYSTERECTOMY  2007  . ADRENALECTOMY N/A 08/18/2018   Procedure: OPEN LEFT ADRENALECTOMY;  Surgeon: Darnell Level, MD;  Location: [facility];  Service: General;  Laterality: N/A;  . CESAREAN SECTION  1982  . WISDOM TOOTH EXTRACTION  2017    ROS: Review of Systems Negative except as stated above  PHYSICAL EXAM: BP (!) 152/87   Pulse 71   Temp 98.6 F (37 C)   Ht 5\' 8"  (1.727 m)   Wt 228 lb 4.8 oz (103.6 kg)   SpO2 99%   BMI 34.71 kg/m   Physical Exam BP148/90 General appearance - alert, well appearing, and in no distress Mental status - normal mood, behavior, speech, dress, motor activity, and thought processes Extremities -patient with moderate to severe enlargement of both knee joints right greater than left.  She has some soft tissue edema on the right.  No point tenderness.  Good range of motion without crepitus.  Has a 0.5 cm small sore on the lateral posterior aspect of the right knee.  No  surrounding erythema.  No drainage.      Latest Ref Rng & Units 06/17/2022    2:48 PM 03/03/2022    9:36 AM 05/21/2021   12:09 PM  CMP  Glucose 70 - 99 mg/dL 161  87  096   BUN 8 - 27 mg/dL 9  13  7   Creatinine 0.57 - 1.00 mg/dL 5.63  8.75  6.43   Sodium 134 - 144 mmol/L 146  143  145   Potassium 3.5 - 5.2 mmol/L 4.3  4.2  4.4   Chloride 96 - 106 mmol/L 110  106  108   CO2 20 - 29 mmol/L 23  20  22    Calcium 8.7 - 10.3 mg/dL 9.5  9.6  9.7   Total Protein 6.0 - 8.5 g/dL 6.8  7.7  7.1   Total Bilirubin 0.0 - 1.2 mg/dL 0.3  0.3  0.4   Alkaline Phos 44 - 121 IU/L 91  88  102   AST 0 - 40 IU/L 13  18  21    ALT 0 - 32 IU/L 12  10  17     Lipid Panel     Component Value Date/Time   CHOL 199 03/03/2022 0936   TRIG 46 03/03/2022 0936   HDL 58 03/03/2022 0936   CHOLHDL 3.6 07/25/2020 0909   CHOLHDL 2.8 12/17/2015 1035   VLDL 10 12/17/2015 1035   LDLCALC 132 (H) 03/03/2022 0936    CBC    Component Value Date/Time   WBC 4.0 03/03/2022 0936   WBC 9.7 08/23/2018 1255   RBC 4.68 03/03/2022 0936   RBC 3.59 (L) 08/23/2018 1255   HGB 12.7 03/03/2022 0936   HCT 38.0 03/03/2022 0936   PLT 224 03/03/2022 0936   MCV 81 03/03/2022 0936   MCH 27.1 03/03/2022 0936   MCH 28.7 08/23/2018 1255   MCHC 33.4 03/03/2022 0936   MCHC 32.2 08/23/2018 1255   RDW 12.8 03/03/2022 0936   LYMPHSABS 1.0 03/03/2022 0936   MONOABS 1.2 (H) 08/20/2018 0755   EOSABS 0.1 03/03/2022 0936   BASOSABS 0.0 03/03/2022 0936    ASSESSMENT AND PLAN: 1. Primary osteoarthritis of both knees Patient with flare of pain in the knees due to osteoarthritis.  Stop Advil.  Advised that she can take meloxicam which was prescribed for her in the past.  Will refer to orthopedics.  May benefit from injections. - AMB referral to orthopedics  2. Essential hypertension Not at goal.  However patient reports blood pressure readings have been good at home.  Thinks elevation is due to use of NSAIDs over the past week.  Advised to  keep a log of readings and bring them with her when she comes to see her PCP next month.  In the meantime she will continue her current medications including spironolactone, Cozaar, and carvedilol. - spironolactone (ALDACTONE) 25 MG tablet; Take 1 tablet (25 mg total) by mouth daily.  Dispense: 90 tablet; Refill: 1  3. Prediabetes Discussed and encouraged healthy eating habits to prevent progression to full diabetes.  Printed information also given. - POCT glycosylated hemoglobin (Hb A1C)     Patient was given the opportunity to ask questions.  Patient verbalized understanding of the plan and was able to repeat key elements of the plan.   This documentation was completed using Paediatric nurse.  Any transcriptional errors are unintentional.  Orders Placed This Encounter  Procedures  . AMB referral to orthopedics  . POCT glycosylated hemoglobin (Hb A1C)     Requested Prescriptions   Signed Prescriptions Disp Refills  . spironolactone (ALDACTONE) 25 MG tablet 90 tablet 1    Sig: Take 1 tablet (25 mg total) by mouth daily.    No follow-ups on file.  Jonah Blue, MD, FACP

## 2022-12-08 ENCOUNTER — Ambulatory Visit: Payer: 59 | Admitting: Orthopaedic Surgery

## 2022-12-21 ENCOUNTER — Encounter: Payer: Self-pay | Admitting: Family Medicine

## 2022-12-21 ENCOUNTER — Ambulatory Visit: Payer: 59 | Attending: Family Medicine | Admitting: Family Medicine

## 2022-12-21 VITALS — BP 131/76 | HR 77 | Ht 68.0 in | Wt 225.2 lb

## 2022-12-21 DIAGNOSIS — I10 Essential (primary) hypertension: Secondary | ICD-10-CM | POA: Diagnosis not present

## 2022-12-21 DIAGNOSIS — E7800 Pure hypercholesterolemia, unspecified: Secondary | ICD-10-CM | POA: Diagnosis not present

## 2022-12-21 DIAGNOSIS — M170 Bilateral primary osteoarthritis of knee: Secondary | ICD-10-CM

## 2022-12-21 MED ORDER — SPIRONOLACTONE 25 MG PO TABS: 25.0000 mg | ORAL_TABLET | Freq: Every day | ORAL | 1 refills | Status: DC

## 2022-12-21 MED ORDER — LOSARTAN POTASSIUM 100 MG PO TABS: 100.0000 mg | ORAL_TABLET | Freq: Every day | ORAL | 1 refills | Status: DC

## 2022-12-21 MED ORDER — CARVEDILOL 25 MG PO TABS: 25.0000 mg | ORAL_TABLET | Freq: Two times a day (BID) | ORAL | 1 refills | Status: DC

## 2022-12-21 MED ORDER — EZETIMIBE 10 MG PO TABS: 10.0000 mg | ORAL_TABLET | Freq: Every day | ORAL | 1 refills | Status: DC

## 2022-12-21 NOTE — Progress Notes (Signed)
Subjective:  Patient ID: Kaylee Nelson, female    DOB: 06-10-1955  Age: 67 y.o. MRN: [id number]  CC: Medical Management of Chronic Issues   HPI Kaylee Nelson is a 67 y.o. 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  She presents with ongoing knee pain. She reports that the pain is manageable with meloxicam, an anti-inflammatory medication. The pain is described as a pressure sensation that is most noticeable upon standing, but improves with movement. The patient has started to incorporate walking into her routine to help manage the knee pain. She has a history of fluid accumulation in the knees, but currently denies any swelling.  The patient also has a history of hyperlipidemia, which is being managed with Zetia due to intolerance to other cholesterol medications.  She endorses adherence with her antihypertensives and her blood pressure is controlled. She missed her appointment with the [facility] for follow-up post retroperitoneal tumor resection and plans to reschedule.       Past Medical History:  Diagnosis Date   Arthritis    bilateral knees   Blood transfusion without reported diagnosis 2007   Heart murmur    as a child   Hypertension    on meds   Phlegm in throat    LAST WEEK OR TWO NO FEVER   Sickle cell trait (HCC)    on preventative iron po meds    Past Surgical History:  Procedure Laterality Date   ABDOMINAL HYSTERECTOMY  2007   ADRENALECTOMY N/A 08/18/2018   Procedure: OPEN LEFT ADRENALECTOMY;  Surgeon: Darnell Level, MD;  Location: [facility];  Service: General;  Laterality: N/A;   CESAREAN SECTION  1982   WISDOM TOOTH EXTRACTION  2017    Family History  Problem Relation Age of Onset    Cancer Mother    Breast cancer Mother    Cancer Maternal Aunt    Breast cancer Maternal Aunt    Cancer Daughter    Breast cancer Daughter    BRCA 1/2 Cousin    Colon polyps Neg Hx    Colon cancer Neg Hx    Esophageal cancer Neg Hx    Rectal cancer Neg Hx    Stomach cancer Neg Hx     Social History   Socioeconomic History   Marital status: Single    Spouse name: Not on file   Number of children: Not on file   Years of education: Not on file   Highest education level: Not on file  Occupational History   Not on file  Tobacco Use   Smoking status: Never   Smokeless tobacco: Never  Vaping Use   Vaping status: Never Used  Substance and Sexual Activity   Alcohol use: No    Alcohol/week: 0.0 standard drinks of alcohol   Drug use: No   Sexual activity: Never  Other Topics Concern   Not on file  Social History Narrative   Not on file   Social Determinants of Health   Financial Resource Strain: Low Risk  (12/21/2022)   Overall Financial Resource Strain (CARDIA)    Difficulty of Paying Living Expenses: Not hard at all  Food Insecurity: Patient Declined (12/21/2022)   Hunger Vital Sign    Worried About Programme researcher, broadcasting/film/video  in the Last Year: Patient declined    Ran Out of Food in the Last Year: Patient declined  Transportation Needs: No Transportation Needs (12/21/2022)   PRAPARE - Administrator, Civil Service (Medical): No    Lack of Transportation (Non-Medical): No  Physical Activity: Inactive (12/21/2022)   Exercise Vital Sign    Days of Exercise per Week: 0 days    Minutes of Exercise per Session: 0 min  Stress: No Stress Concern Present (12/21/2022)   [employer] of Occupational Health - Occupational Stress Questionnaire    Feeling of Stress : Not at all  Social Connections: Unknown (12/21/2022)   Social Connection and Isolation Panel [NHANES]    Frequency of Communication with Friends and Family: More than three times a week    Frequency of  Social Gatherings with Friends and Family: Never    Attends Religious Services: Patient declined    Database administrator or Organizations: Patient declined    Attends Banker Meetings: Patient declined    Marital Status: Patient declined    Allergies  Allergen Reactions   Shrimp (Diagnostic) Hives, Itching, Swelling and Rash   Sulfa Antibiotics Hives, Itching and Rash   Shrimp [Shellfish Allergy]     Outpatient Medications Prior to Visit  Medication Sig Dispense Refill   furosemide (LASIX) 20 MG tablet Take 1 tablet (20 mg total) by mouth daily as needed. For pedal edema 30 tablet 3   meloxicam (MOBIC) 15 MG tablet Take 0.5-1 tablets (7.5-15 mg total) by mouth daily as needed for pain. 30 tablet 1   traMADol (ULTRAM) 50 MG tablet Take 1 tablet (50 mg total) by mouth every 6 (six) hours as needed. 15 tablet 0   carvedilol (COREG) 25 MG tablet Take 1 tablet (25 mg total) by mouth 2 (two) times daily with a meal. 180 tablet 1   ezetimibe (ZETIA) 10 MG tablet Take 1 tablet (10 mg total) by mouth daily. 90 tablet 1   losartan (COZAAR) 100 MG tablet Take 1 tablet (100 mg total) by mouth daily. 90 tablet 1   spironolactone (ALDACTONE) 25 MG tablet Take 1 tablet (25 mg total) by mouth daily. 90 tablet 1   Artificial Tear Ointment (DRY EYES OP) Apply 2 drops to eye daily at 6 (six) AM. (Patient not taking: Reported on 12/21/2022)     CALCIUM MAGNESIUM ZINC PO Take 1 tablet by mouth daily at 6 (six) AM. (Patient not taking: Reported on 12/21/2022)     CALCIUM PO Take 600 mg by mouth daily at 6 (six) AM. (Patient not taking: Reported on 12/21/2022)     Cyanocobalamin (VITAMIN B-12 PO) Take 5,000 mcg by mouth daily at 6 (six) AM. (Patient not taking: Reported on 12/21/2022)     Ferrous Sulfate (IRON PO) Take 18 mg by mouth daily at 6 (six) AM. (Patient not taking: Reported on 12/21/2022)     PEG-KCl-NaCl-NaSulf-Na Asc-C (PLENVU) 140 g SOLR Take 1 kit by mouth as directed. Generic  okay-please use SingleCare or GoodRx for price if able; (Patient not taking: Reported on 02/25/2022) 1 each 0   0.9 %  sodium chloride infusion      No facility-administered medications prior to visit.     ROS Review of Systems  Constitutional:  Negative for activity change and appetite change.  HENT:  Negative for sinus pressure and sore throat.   Respiratory:  Negative for chest tightness, shortness of breath and wheezing.   Cardiovascular:  Negative for  chest pain and palpitations.  Gastrointestinal:  Negative for abdominal distention, abdominal pain and constipation.  Genitourinary: Negative.   Psychiatric/Behavioral:  Negative for behavioral problems and dysphoric mood.     Objective:  BP 131/76   Pulse 77   Ht 5\' 8"  (1.727 m)   Wt 225 lb 3.2 oz (102.2 kg)   SpO2 98%   BMI 34.24 kg/m      12/21/2022    3:20 PM 11/17/2022    4:03 PM 06/17/2022    2:42 PM  BP/Weight  Systolic BP 131 152 137  Diastolic BP 76 87 75  Wt. (Lbs) 225.2 228.3   BMI 34.24 kg/m2 34.71 kg/m2       Physical Exam Constitutional:      Appearance: She is well-developed.  Cardiovascular:     Rate and Rhythm: Normal rate.     Heart sounds: Normal heart sounds. No murmur heard. Pulmonary:     Effort: Pulmonary effort is normal.     Breath sounds: Normal breath sounds. No wheezing or rales.  Chest:     Chest wall: No tenderness.  Abdominal:     General: Bowel sounds are normal. There is no distension.     Palpations: Abdomen is soft. There is no mass.     Tenderness: There is no abdominal tenderness.  Musculoskeletal:        General: Normal range of motion.     Right knee: No effusion. Normal range of motion.     Left knee: No effusion. Normal range of motion.     Right lower leg: No edema.     Left lower leg: No edema.  Neurological:     Mental Status: She is alert and oriented to person, place, and time.  Psychiatric:        Mood and Affect: Mood normal.        Latest Ref Rng &  Units 06/17/2022    2:48 PM 03/03/2022    9:36 AM 05/21/2021   12:09 PM  CMP  Glucose 70 - 99 mg/dL 784  87  696   BUN 8 - 27 mg/dL 9  13  7    Creatinine 0.57 - 1.00 mg/dL 2.95  2.84  1.32   Sodium 134 - 144 mmol/L 146  143  145   Potassium 3.5 - 5.2 mmol/L 4.3  4.2  4.4   Chloride 96 - 106 mmol/L 110  106  108   CO2 20 - 29 mmol/L 23  20  22    Calcium 8.7 - 10.3 mg/dL 9.5  9.6  9.7   Total Protein 6.0 - 8.5 g/dL 6.8  7.7  7.1   Total Bilirubin 0.0 - 1.2 mg/dL 0.3  0.3  0.4   Alkaline Phos 44 - 121 IU/L 91  88  102   AST 0 - 40 IU/L 13  18  21    ALT 0 - 32 IU/L 12  10  17      Lipid Panel     Component Value Date/Time   CHOL 199 03/03/2022 0936   TRIG 46 03/03/2022 0936   HDL 58 03/03/2022 0936   CHOLHDL 3.6 07/25/2020 0909   CHOLHDL 2.8 12/17/2015 1035   VLDL 10 12/17/2015 1035   LDLCALC 132 (H) 03/03/2022 0936    CBC    Component Value Date/Time   WBC 4.0 03/03/2022 0936   WBC 9.7 08/23/2018 1255   RBC 4.68 03/03/2022 0936   RBC 3.59 (L) 08/23/2018 1255   HGB 12.7 03/03/2022  0936   HCT 38.0 03/03/2022 0936   PLT 224 03/03/2022 0936   MCV 81 03/03/2022 0936   MCH 27.1 03/03/2022 0936   MCH 28.7 08/23/2018 1255   MCHC 33.4 03/03/2022 0936   MCHC 32.2 08/23/2018 1255   RDW 12.8 03/03/2022 0936   LYMPHSABS 1.0 03/03/2022 0936   MONOABS 1.2 (H) 08/20/2018 0755   EOSABS 0.1 03/03/2022 0936   BASOSABS 0.0 03/03/2022 0936    Lab Results  Component Value Date   HGBA1C 5.9 (A) 11/17/2022   The 10-year ASCVD risk score (Arnett DK, et al., 2019) is: 10.1%   Values used to calculate the score:     Age: 60 years     Sex: Female     Is Non-Hispanic African American: Yes     Diabetic: No     Tobacco smoker: No     Systolic Blood Pressure: 131 mmHg     Is BP treated: Yes     HDL Cholesterol: 58 mg/dL     Total Cholesterol: 199 mg/dL   Assessment & Plan:      Knee Osteoarthritis Improved with Meloxicam. No current swelling. Discussed potential future referral to  orthopedics for knee injections if symptoms worsen. -Continue Meloxicam as needed. -Consider orthopedic referral for knee injections if symptoms worsen.  Hypertension Well controlled on current regimen of Carvedilol, Losartan, and Spironolactone. -Continue Carvedilol, Losartan, and Spironolactone. -Counseled on blood pressure goal of less than 130/80, low-sodium, DASH diet, medication compliance, 150 minutes of moderate intensity exercise per week. Discussed medication compliance, adverse effects.   Hyperlipidemia On Zetia due to intolerance to statins. Discussed the importance of continuing Zetia based on calculated cardiovascular risk score of 10.1% -Continue Zetia.  General Health Maintenance -Order blood work to monitor kidney function due to current medications. -Refill medications as needed. -Follow-up in 6 months or sooner if new issues arise.          Meds ordered this encounter  Medications   carvedilol (COREG) 25 MG tablet    Sig: Take 1 tablet (25 mg total) by mouth 2 (two) times daily with a meal.    Dispense:  180 tablet    Refill:  1   ezetimibe (ZETIA) 10 MG tablet    Sig: Take 1 tablet (10 mg total) by mouth daily.    Dispense:  90 tablet    Refill:  1   losartan (COZAAR) 100 MG tablet    Sig: Take 1 tablet (100 mg total) by mouth daily.    Dispense:  90 tablet    Refill:  1   spironolactone (ALDACTONE) 25 MG tablet    Sig: Take 1 tablet (25 mg total) by mouth daily.    Dispense:  90 tablet    Refill:  1    Follow-up: Return in about 6 months (around 06/20/2023).       Hoy Register, MD, FAAFP. [facility] [place], [place] [phone]   12/21/2022, 4:00 PM

## 2022-12-21 NOTE — Patient Instructions (Signed)
VISIT SUMMARY:  During today's visit, we discussed your ongoing knee pain, which is currently manageable with meloxicam. We also reviewed your hypertension and high cholesterol, both of which are well controlled with your current medications. Additionally, we talked about general health maintenance and the need for routine blood work to monitor your kidney function.  YOUR PLAN:  -KNEE OSTEOARTHRITIS: Knee osteoarthritis is a condition where the cartilage in the knee joint wears down over time, causing pain and stiffness. Your pain is currently manageable with meloxicam, and there is no swelling. If your symptoms worsen, we may consider referring you to orthopedics for knee injections. Continue taking meloxicam as needed.  -HYPERTENSION: Hypertension, or high blood pressure, is a condition where the force of the blood against your artery walls is too high. Your blood pressure is well controlled with Carvedilol, Losartan, and Spironolactone. Continue taking these medications as prescribed.  -HYPERLIPIDEMIA: Hyperlipidemia is a condition where there are high levels of fats (lipids) in the blood. You are taking Zetia to manage your cholesterol levels due to intolerance to statins. It is important to continue taking Zetia based on your cardiovascular risk score.  -GENERAL HEALTH MAINTENANCE: We will order blood work to monitor your kidney function due to your current medications. Please ensure you get your medications refilled as needed and follow up in 6 months or sooner if any new issues arise.  INSTRUCTIONS:  Please follow up in 6 months or sooner if new issues arise. Ensure you get your blood work done to monitor kidney function and refill your medications as needed.

## 2022-12-22 ENCOUNTER — Encounter: Payer: Self-pay | Admitting: Family Medicine

## 2022-12-22 LAB — CMP14+EGFR
ALT: 15 [IU]/L (ref 0–32)
AST: 23 [IU]/L (ref 0–40)
Albumin: 4.1 g/dL (ref 3.9–4.9)
Alkaline Phosphatase: 106 [IU]/L (ref 44–121)
BUN/Creatinine Ratio: 7 — ABNORMAL LOW (ref 12–28)
BUN: 7 mg/dL — ABNORMAL LOW (ref 8–27)
Bilirubin Total: 0.3 mg/dL (ref 0.0–1.2)
CO2: 21 mmol/L (ref 20–29)
Calcium: 9.6 mg/dL (ref 8.7–10.3)
Chloride: 108 mmol/L — ABNORMAL HIGH (ref 96–106)
Creatinine, Ser: 0.94 mg/dL (ref 0.57–1.00)
Globulin, Total: 2.9 g/dL (ref 1.5–4.5)
Glucose: 84 mg/dL (ref 70–99)
Potassium: 4.5 mmol/L (ref 3.5–5.2)
Sodium: 144 mmol/L (ref 134–144)
Total Protein: 7 g/dL (ref 6.0–8.5)
eGFR: 67 mL/min/{1.73_m2} (ref >59)

## 2023-02-15 ENCOUNTER — Ambulatory Visit: Payer: Self-pay | Admitting: *Deleted

## 2023-02-15 NOTE — Telephone Encounter (Signed)
 Message from [place] F sent at 02/15/2023  3:21 PM EST  Summary: Cold symptoms-vomiting   Pt is calling in because she is experiencing headaches, chills, sore throat while coughing, as well as vomiting this afternoon.          Call History  Contact Date/Time Type Contact Phone/Fax By  02/15/2023 03:18 PM EST Phone (Incoming) Kaylee Nelson, Kaylee Nelson (Self) [phone] (M) Epifanio Grant M   Reason for Disposition  ALSO, mild vomiting occurs only when coughing  Answer Assessment - Initial Assessment Questions 1. ONSET: When did the cough begin?      I'm coughing up clear mucus, headaches, chills, sore throat.   I'm at work and I started vomited.   I'm taking Coricidin HBP at night which is helping.   I have a runny nose.   2. SEVERITY: How bad is the cough today?      It's clear mucus I vomited here at work.   3. SPUTUM: Describe the color of your sputum (none, dry cough; clear, white, yellow, green)     Clear mucus when I cough up anything. 4. HEMOPTYSIS: Are you coughing up any blood? If so ask: How much? (flecks, streaks, tablespoons, etc.)     Not asked 5. DIFFICULTY BREATHING: Are you having difficulty breathing? If Yes, ask: How bad is it? (e.g., mild, moderate, severe)    - MILD: No SOB at rest, mild SOB with walking, speaks normally in sentences, can lie down, no retractions, pulse < 100.    - MODERATE: SOB at rest, SOB with minimal exertion and prefers to sit, cannot lie down flat, speaks in phrases, mild retractions, audible wheezing, pulse 100-120.    - SEVERE: Very SOB at rest, speaks in single words, struggling to breathe, sitting hunched forward, retractions, pulse > 120      No 6. FEVER: Do you have a fever? If Yes, ask: What is your temperature, how was it measured, and when did it start?     I'm having chills a lot. 7. CARDIAC HISTORY: Do you have any history of heart disease? (e.g., heart attack, congestive heart failure)      Not asked 8.  LUNG HISTORY: Do you have any history of lung disease?  (e.g., pulmonary embolus, asthma, emphysema)     Not asked 9. PE RISK FACTORS: Do you have a history of blood clots? (or: recent major surgery, recent prolonged travel, bedridden)     Not asked 10. OTHER SYMPTOMS: Do you have any other symptoms? (e.g., runny nose, wheezing, chest pain)       Above 11. PREGNANCY: Is there any chance you are pregnant? When was your last menstrual period?       N/A due to age 68. TRAVEL: Have you traveled out of the country in the last month? (e.g., travel history, exposures)       Not asked  Protocols used: Cough - Acute Productive-A-AH

## 2023-02-15 NOTE — Telephone Encounter (Signed)
  Chief Complaint: Having cold symptoms, with vomiting that just started this afternoon Symptoms: Headaches, chills and a sore throat with a runny nose.   Just vomited while at work a few minutes ago.   I'm still at work. Frequency: Now Pertinent Negatives: Patient denies taking anything for the vomiting.   It just happened here at work.   I'm taking Coricidin HBP and it's helping. Disposition: [] ED /[] Urgent Care (no appt availability in office) / [] Appointment(In office/virtual)/ []  [facility]/ [x] Home Care/ [] Refused Recommended Disposition /[] [facility]/ []  Follow-up with PCP Additional Notes: Went over the home care advice

## 2023-02-16 ENCOUNTER — Inpatient Hospital Stay: Admission: RE | Admit: 2023-02-16 | Payer: 59 | Source: Ambulatory Visit

## 2023-02-19 ENCOUNTER — Ambulatory Visit: Payer: Self-pay

## 2023-02-19 NOTE — Telephone Encounter (Signed)
Summary: flu   Pt had the flu last week and the medication they gave her is not working .  Pt is not feeling any better.  Weak, headache.         Chief Complaint: ED 02/16/23 with flu. Still sick. Did not take Tamiflu because of side effects. Asking for something for cough. Still coughing, weak, headache. Symptoms: Above Frequency: Last week Pertinent Negatives: Patient denies fever Disposition: [] ED /[] Urgent Care (no appt availability in office) / [] Appointment(In office/virtual)/ []  [facility]/ [] Home Care/ [] Refused Recommended Disposition /[] [facility]/ [x]  Follow-up with PCP Additional Notes: Please advise pt.  Reason for Disposition  [1] Fever returns after gone for over 24 hours AND [2] symptoms worse or not improved  Answer Assessment - Initial Assessment Questions 1. WORST SYMPTOM: "What is your worst symptom?" (e.g., cough, runny nose, muscle aches, headache, sore throat, fever)      Continued flu symptoms - headache, weak, cough 2. ONSET: "When did your flu symptoms start?"      Last week 3. COUGH: "How bad is the cough?"       Moderate 4. RESPIRATORY DISTRESS: "Describe your breathing."      No 5. FEVER: "Do you have a fever?" If Yes, ask: "What is your temperature, how was it measured, and when did it start?"     No 6. EXPOSURE: "Were you exposed to someone with influenza?"       Unsure 7. FLU VACCINE: "Did you get a flu shot this year?"     N/a 8. HIGH RISK DISEASE: "Do you have any chronic medical problems?" (e.g., heart or lung disease, asthma, weak immune system, or other HIGH RISK conditions)     no 9. PREGNANCY: "Is there any chance you are pregnant?" "When was your last menstrual period?"     No 10. OTHER SYMPTOMS: "Do you have any other symptoms?"  (e.g., runny nose, muscle aches, headache, sore throat)       Above  Protocols used: Influenza (Flu) - [facility]

## 2023-02-19 NOTE — Telephone Encounter (Signed)
Spoke with patient  . Patient voiced she was dx with the flu on 02/16/2023 and she that she is not feel better she is feeling worse. Voiced the medication she was given is not helping at all. No appointments available to today. Patient advised UC today for assessment. Agreeable will go to UC

## 2023-02-22 DIAGNOSIS — J101 Influenza due to other identified influenza virus with other respiratory manifestations: Secondary | ICD-10-CM | POA: Diagnosis not present

## 2023-02-22 DIAGNOSIS — R7989 Other specified abnormal findings of blood chemistry: Secondary | ICD-10-CM | POA: Diagnosis not present

## 2023-02-22 DIAGNOSIS — N179 Acute kidney failure, unspecified: Secondary | ICD-10-CM | POA: Diagnosis not present

## 2023-02-22 DIAGNOSIS — I10 Essential (primary) hypertension: Secondary | ICD-10-CM | POA: Diagnosis not present

## 2023-02-23 ENCOUNTER — Telehealth (HOSPITAL_BASED_OUTPATIENT_CLINIC_OR_DEPARTMENT_OTHER): Payer: 59 | Admitting: Internal Medicine

## 2023-02-23 DIAGNOSIS — N179 Acute kidney failure, unspecified: Secondary | ICD-10-CM | POA: Diagnosis not present

## 2023-02-23 DIAGNOSIS — Z538 Procedure and treatment not carried out for other reasons: Secondary | ICD-10-CM

## 2023-02-23 NOTE — Progress Notes (Unsigned)
I connected with patient via video.  She is currently in the hospital at [facility].  This visit was canceled.  Patient told to call us once she is discharged from the hospital for hospital follow-up visit.

## 2023-02-24 DIAGNOSIS — N179 Acute kidney failure, unspecified: Secondary | ICD-10-CM | POA: Diagnosis not present

## 2023-03-02 ENCOUNTER — Encounter: Payer: Self-pay | Admitting: Nurse Practitioner

## 2023-03-02 ENCOUNTER — Ambulatory Visit: Payer: 59 | Attending: Nurse Practitioner | Admitting: Nurse Practitioner

## 2023-03-02 VITALS — BP 114/67 | Resp 19 | Ht 68.0 in | Wt 215.6 lb

## 2023-03-02 DIAGNOSIS — J101 Influenza due to other identified influenza virus with other respiratory manifestations: Secondary | ICD-10-CM

## 2023-03-02 DIAGNOSIS — Z09 Encounter for follow-up examination after completed treatment for conditions other than malignant neoplasm: Secondary | ICD-10-CM | POA: Diagnosis not present

## 2023-03-02 NOTE — Progress Notes (Signed)
Assessment & Plan:  Kaylee Nelson was seen today for hospitalization follow-up.  Diagnoses and all orders for this visit:  Hospital discharge follow-up Work note given with return to work 03/07/2023  Influenza A For cough you can take Coricidin HBP over-the-counter. Need to increase fluid intake: This can include broth, Gatorade, Ensure. Encouraged zinc, vitamin C, B complex and vitamin D   Patient has been counseled on age-appropriate routine health concerns for screening and prevention. These are reviewed and up-to-date. Referrals have been placed accordingly. Immunizations are up-to-date or declined.    Subjective:   Chief Complaint  Patient presents with   Hospitalization Follow-up    Kaylee Nelson 68 y.o. female presents to office today for HFU.  She is a patient of Dr. Alvis Nelson.  Kaylee Nelson was diagnosed with flu a on February 16, 2023.  She declined the Tamiflu vaccine stating she had already gave people nightmares so she did not pick this up.  Today she states she is still feeling weak and experiencing dry cough which is worse with lying down.  BP Readings from Last 3 Encounters:  03/02/23 114/67  12/21/22 131/76  11/17/22 (!) 152/87     Review of Systems  Constitutional:  Positive for malaise/fatigue. Negative for fever and weight loss.  HENT: Negative.  Negative for nosebleeds.   Eyes: Negative.  Negative for blurred vision, double vision and photophobia.  Respiratory:  Positive for cough. Negative for shortness of breath.   Cardiovascular: Negative.  Negative for chest pain, palpitations and leg swelling.  Gastrointestinal: Negative.  Negative for heartburn, nausea and vomiting.  Musculoskeletal: Negative.  Negative for myalgias.  Neurological: Negative.  Negative for dizziness, focal weakness, seizures and headaches.  Psychiatric/Behavioral: Negative.  Negative for suicidal ideas.     Past Medical History:  Diagnosis Date   Arthritis    bilateral knees    Blood transfusion without reported diagnosis 2007   Heart murmur    as a child   Hypertension    on meds   Phlegm in throat    LAST WEEK OR TWO NO FEVER   Sickle cell trait (HCC)    on preventative iron po meds    Past Surgical History:  Procedure Laterality Date   ABDOMINAL HYSTERECTOMY  2007   ADRENALECTOMY N/A 08/18/2018   Procedure: OPEN LEFT ADRENALECTOMY;  Surgeon: Darnell Level, MD;  Location: [facility];  Service: General;  Laterality: N/A;   CESAREAN SECTION  1982   WISDOM TOOTH EXTRACTION  2017    Family History  Problem Relation Age of Onset   Cancer Mother    Breast cancer Mother    Cancer Maternal Aunt    Breast cancer Maternal Aunt    Cancer Daughter    Breast cancer Daughter    BRCA 1/2 Cousin    Colon polyps Neg Hx    Colon cancer Neg Hx    Esophageal cancer Neg Hx    Rectal cancer Neg Hx    Stomach cancer Neg Hx     Social History Reviewed with no changes to be made today.   Outpatient Medications Prior to Visit  Medication Sig Dispense Refill   carvedilol (COREG) 25 MG tablet Take 1 tablet (25 mg total) by mouth 2 (two) times daily with a meal. 180 tablet 1   ezetimibe (ZETIA) 10 MG tablet Take 1 tablet (10 mg total) by mouth daily. 90 tablet 1   furosemide (LASIX) 20 MG tablet Take 1 tablet (20 mg total) by mouth daily  as needed. For pedal edema 30 tablet 3   losartan (COZAAR) 100 MG tablet Take 1 tablet (100 mg total) by mouth daily. 90 tablet 1   meloxicam (MOBIC) 15 MG tablet Take 0.5-1 tablets (7.5-15 mg total) by mouth daily as needed for pain. 30 tablet 1   spironolactone (ALDACTONE) 25 MG tablet Take 1 tablet (25 mg total) by mouth daily. 90 tablet 1   Artificial Tear Ointment (DRY EYES OP) Apply 2 drops to eye daily at 6 (six) AM. (Patient not taking: Reported on 03/02/2023)     CALCIUM MAGNESIUM ZINC PO Take 1 tablet by mouth daily at 6 (six) AM. (Patient not taking: Reported on 03/02/2023)     CALCIUM PO Take 600 mg by mouth daily at 6 (six)  AM. (Patient not taking: Reported on 03/02/2023)     Cyanocobalamin (VITAMIN B-12 PO) Take 5,000 mcg by mouth daily at 6 (six) AM. (Patient not taking: Reported on 03/02/2023)     Ferrous Sulfate (IRON PO) Take 18 mg by mouth daily at 6 (six) AM. (Patient not taking: Reported on 03/02/2023)     PEG-KCl-NaCl-NaSulf-Na Asc-C (PLENVU) 140 g SOLR Take 1 kit by mouth as directed. Generic okay-please use SingleCare or GoodRx for price if able; (Patient not taking: Reported on 03/02/2023) 1 each 0   traMADol (ULTRAM) 50 MG tablet Take 1 tablet (50 mg total) by mouth every 6 (six) hours as needed. (Patient not taking: Reported on 03/02/2023) 15 tablet 0   No facility-administered medications prior to visit.    Allergies  Allergen Reactions   Shrimp (Diagnostic) Hives, Itching, Swelling and Rash   Sulfa Antibiotics Hives, Itching and Rash   Shrimp [Shellfish Allergy]        Objective:    BP 114/67 (BP Location: Left Arm, Patient Position: Sitting, Cuff Size: Normal)   Resp 19   Ht 5\' 8"  (1.727 m)   Wt 215 lb 9.6 oz (97.8 kg)   SpO2 100%   BMI 32.78 kg/m  Wt Readings from Last 3 Encounters:  03/02/23 215 lb 9.6 oz (97.8 kg)  12/21/22 225 lb 3.2 oz (102.2 kg)  11/17/22 228 lb 4.8 oz (103.6 kg)    Physical Exam Vitals and nursing note reviewed.  Constitutional:      Appearance: She is well-developed.  HENT:     Head: Normocephalic and atraumatic.  Cardiovascular:     Rate and Rhythm: Normal rate and regular rhythm.     Heart sounds: Normal heart sounds. No murmur heard.    No friction rub. No gallop.  Pulmonary:     Effort: Pulmonary effort is normal. No tachypnea or respiratory distress.     Breath sounds: Normal breath sounds. No decreased breath sounds, wheezing, rhonchi or rales.  Chest:     Chest wall: No tenderness.  Abdominal:     General: Bowel sounds are normal.     Palpations: Abdomen is soft.  Musculoskeletal:        General: Normal range of motion.     Cervical back:  Normal range of motion.  Skin:    General: Skin is warm and dry.  Neurological:     Mental Status: She is alert and oriented to person, place, and time.     Coordination: Coordination normal.  Psychiatric:        Behavior: Behavior normal. Behavior is cooperative.        Thought Content: Thought content normal.        Judgment: Judgment normal.  Patient has been counseled extensively about nutrition and exercise as well as the importance of adherence with medications and regular follow-up. The patient was given clear instructions to go to ER or return to medical center if symptoms don't improve, worsen or new problems develop. The patient verbalized understanding.   Follow-up: Return if symptoms worsen or fail to improve.   Claiborne Rigg, FNP-BC [facility] [place], [place] [phone]   03/02/2023, 3:50 PM

## 2023-03-02 NOTE — Patient Instructions (Signed)
You can take zinc, B complex, vitamin D and Vitamin C for the next few days

## 2023-06-15 IMAGING — MG MM DIGITAL SCREENING BILAT W/ TOMO AND CAD
8 series · 8 of 24 positions shown · non-contrast
Comparison: Previous exam(s).

CLINICAL DATA: Screening.

EXAM:
DIGITAL SCREENING BILATERAL MAMMOGRAM WITH TOMOSYNTHESIS AND CAD
TECHNIQUE: Bilateral screening digital craniocaudal and mediolateral oblique
mammograms were obtained. Bilateral screening digital breast
tomosynthesis was performed. The images were evaluated with
computer-aided detection.

[R MLO synth-2D]
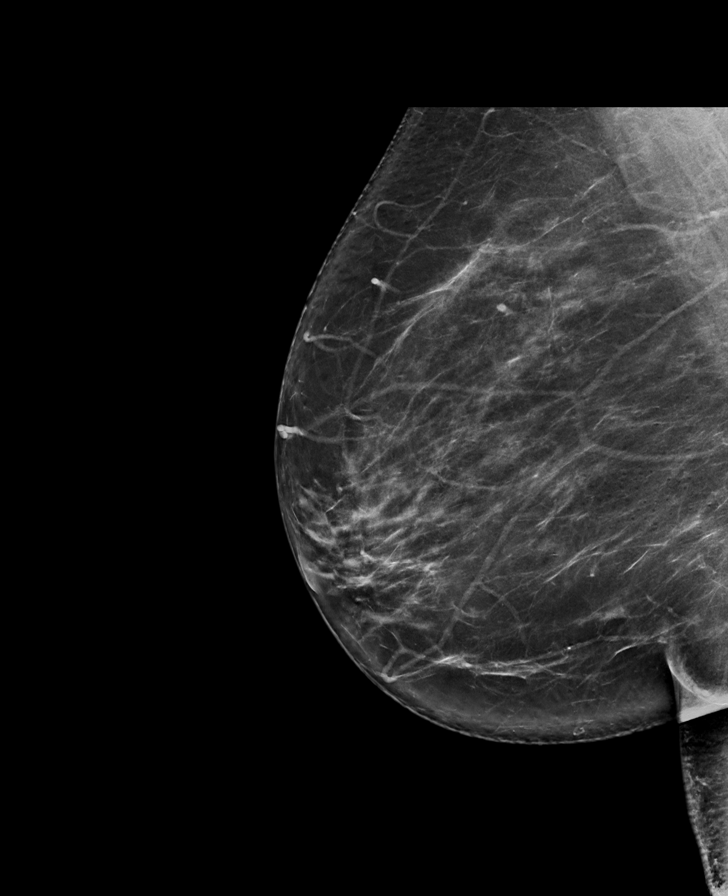

[L MLO synth-2D]
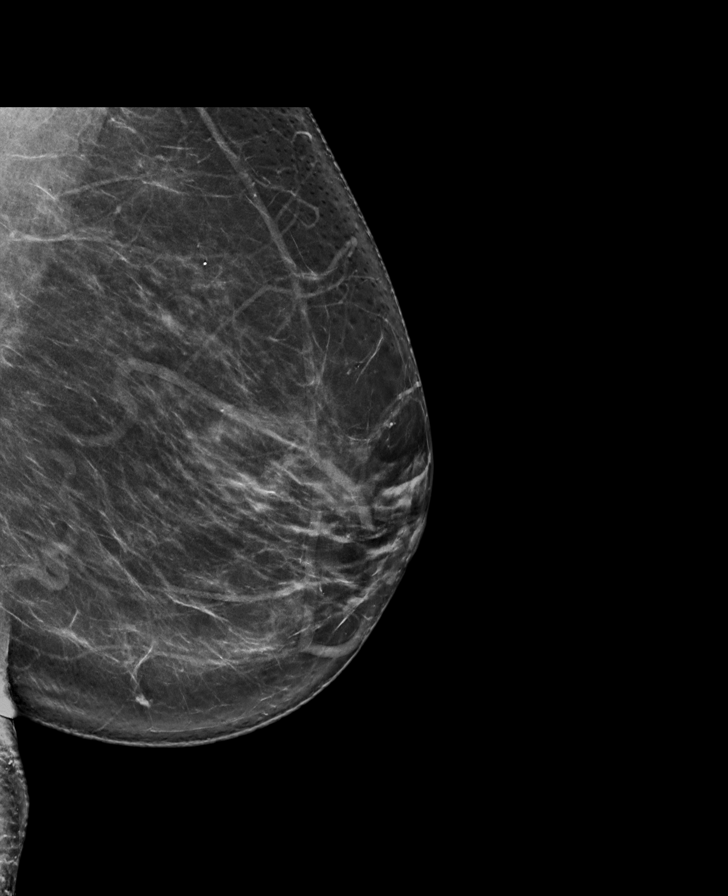

[R CC synth-2D]
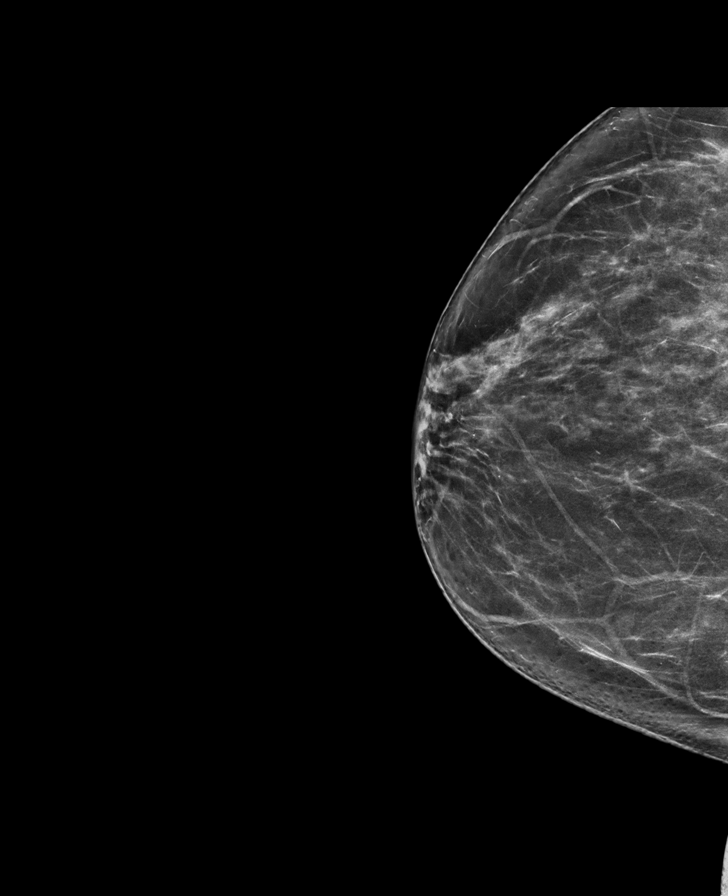

[L CC synth-2D]
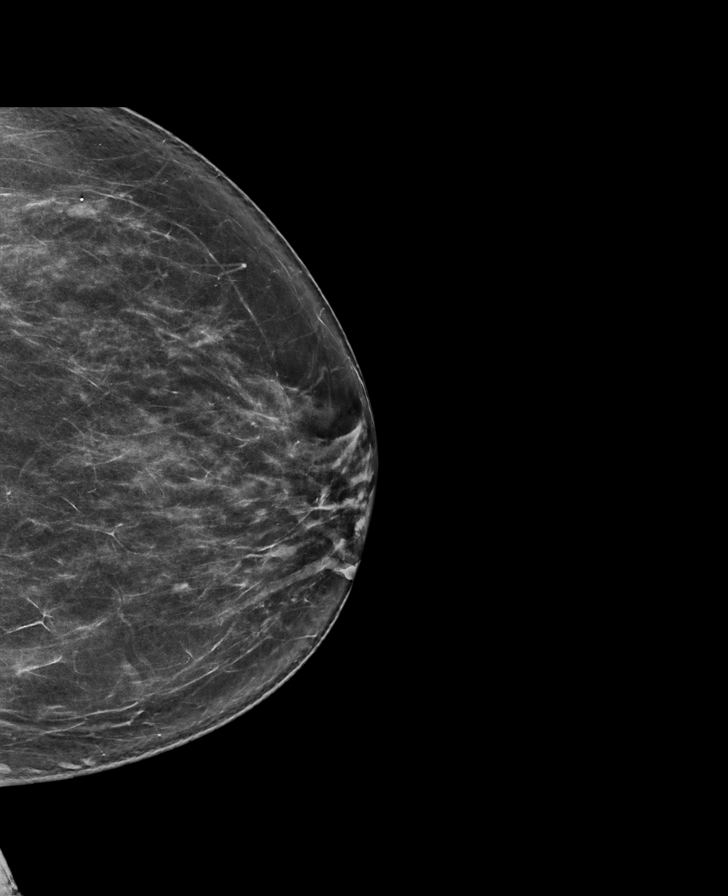

[L CC tomo · tomo slice 43/85.0]
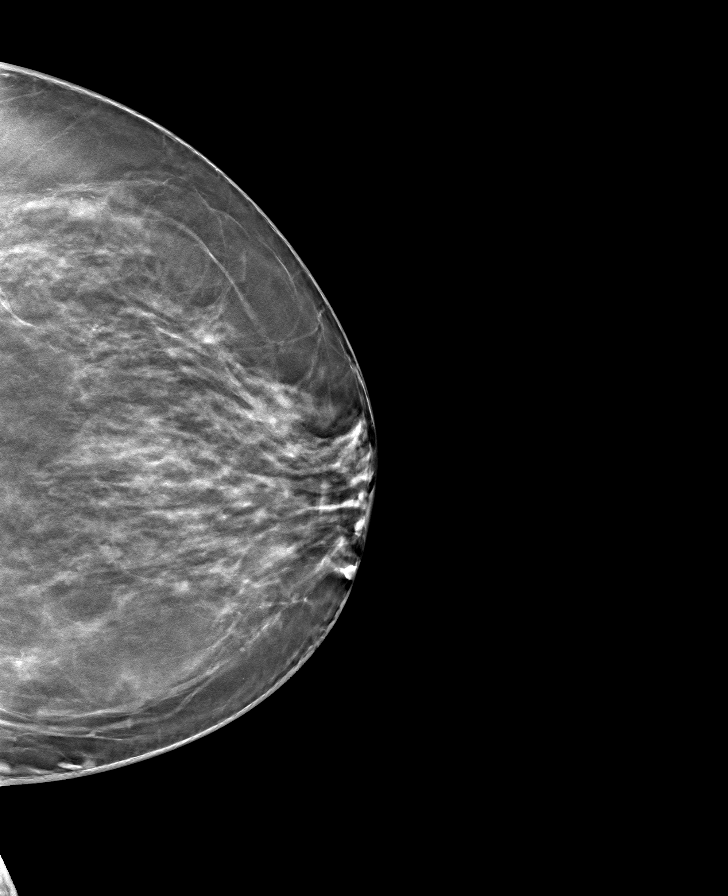

[R CC tomo · tomo slice 41/82.0]
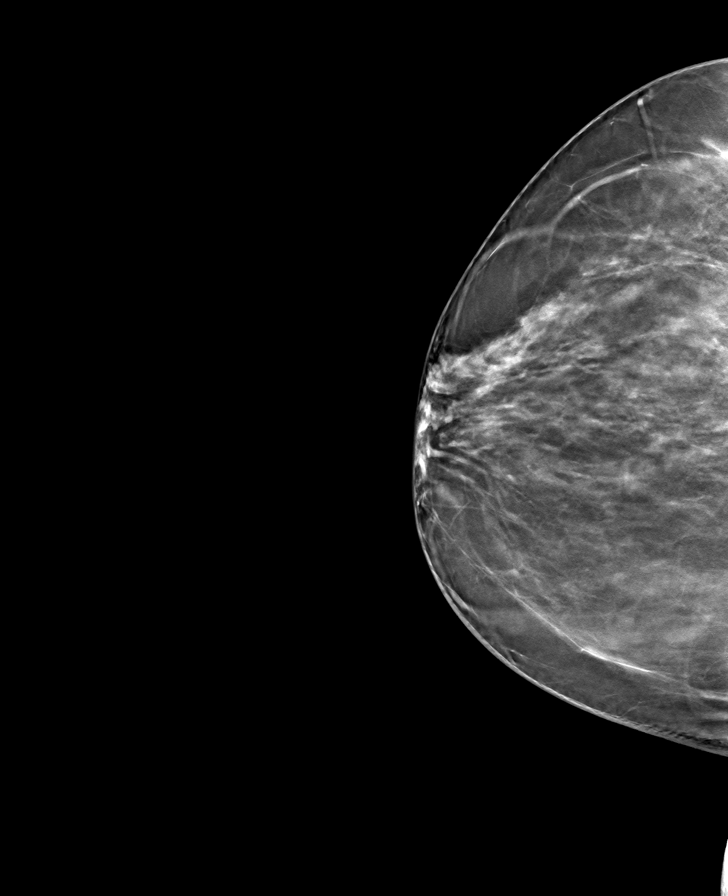

[R MLO tomo · tomo slice 45/90.0]
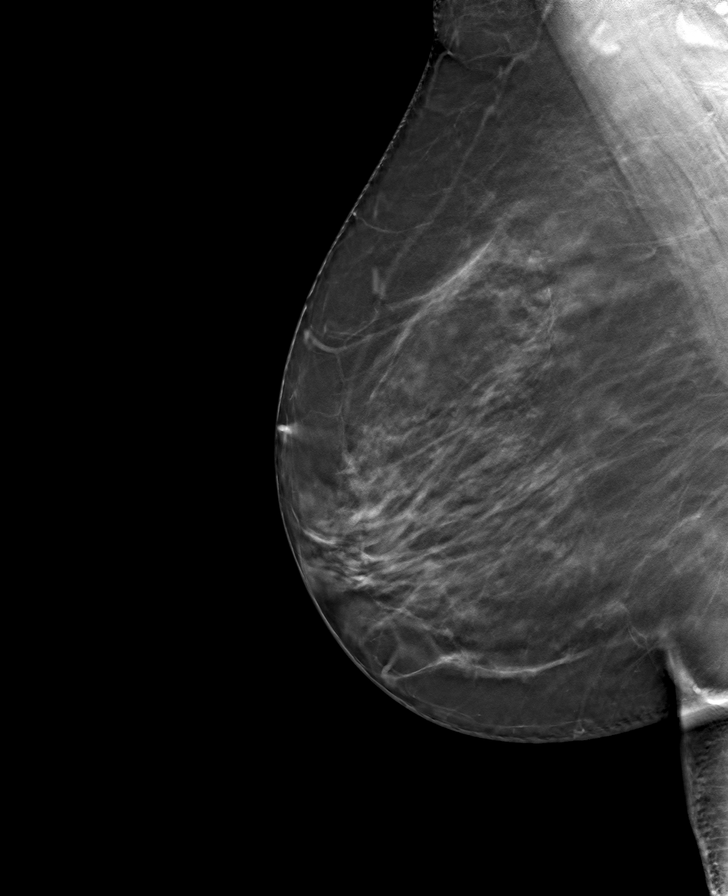

[L MLO tomo · tomo slice 44/87.0]
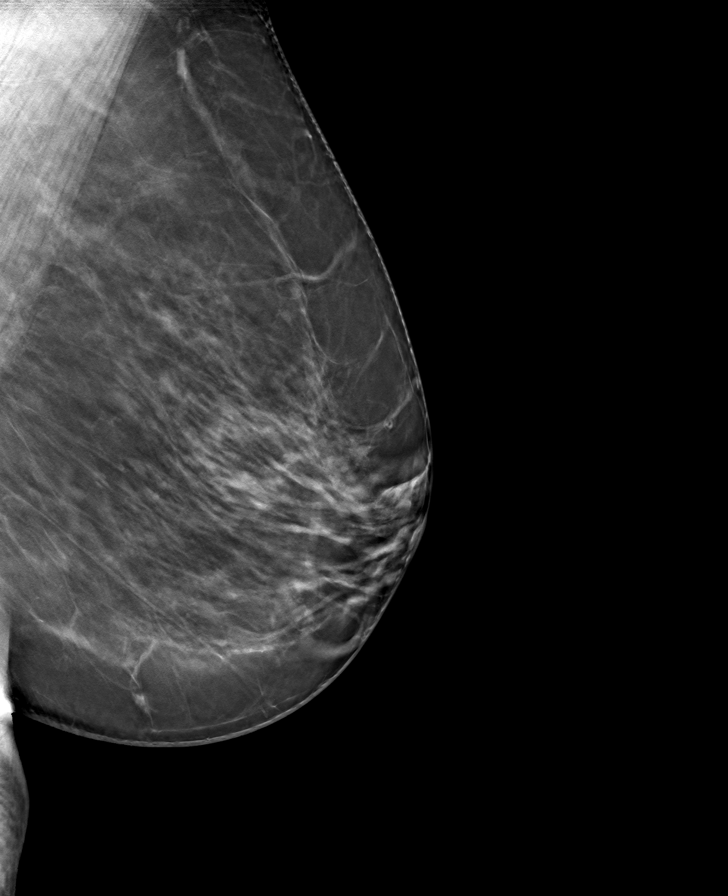

[8 of 24 positions shown; findings below may reference images not displayed]

ACR Breast Density Category b: There are scattered areas of
fibroglandular density.
FINDINGS: In the left breast, a possible mass warrants further evaluation. In
the right breast, no findings suspicious for malignancy.
IMPRESSION: Further evaluation is suggested for a possible mass in the left
breast.

RECOMMENDATION:
Diagnostic mammogram and possibly ultrasound of the left breast.
(Code:[id number]-X-44M)

The patient will be contacted regarding the findings, and additional
imaging will be scheduled.

BI-RADS CATEGORY  0: Incomplete. Need additional imaging evaluation
and/or prior mammograms for comparison.

## 2023-06-21 ENCOUNTER — Ambulatory Visit: Payer: 59 | Admitting: Family Medicine

## 2023-06-25 ENCOUNTER — Other Ambulatory Visit: Payer: Self-pay | Admitting: Family Medicine

## 2023-06-25 DIAGNOSIS — M25551 Pain in right hip: Secondary | ICD-10-CM

## 2023-07-14 DIAGNOSIS — Z85831 Personal history of malignant neoplasm of soft tissue: Secondary | ICD-10-CM | POA: Diagnosis not present

## 2023-07-14 DIAGNOSIS — Z803 Family history of malignant neoplasm of breast: Secondary | ICD-10-CM | POA: Diagnosis not present

## 2023-07-14 DIAGNOSIS — C499 Malignant neoplasm of connective and soft tissue, unspecified: Secondary | ICD-10-CM | POA: Diagnosis not present

## 2023-07-14 DIAGNOSIS — Z08 Encounter for follow-up examination after completed treatment for malignant neoplasm: Secondary | ICD-10-CM | POA: Diagnosis not present

## 2023-07-14 DIAGNOSIS — Z79899 Other long term (current) drug therapy: Secondary | ICD-10-CM | POA: Diagnosis not present

## 2023-07-28 ENCOUNTER — Ambulatory Visit: Attending: Family Medicine | Admitting: Family Medicine

## 2023-07-28 ENCOUNTER — Encounter: Payer: Self-pay | Admitting: Family Medicine

## 2023-07-28 VITALS — BP 123/71 | HR 94 | Ht 68.0 in | Wt 232.2 lb

## 2023-07-28 DIAGNOSIS — R7303 Prediabetes: Secondary | ICD-10-CM | POA: Diagnosis not present

## 2023-07-28 DIAGNOSIS — M170 Bilateral primary osteoarthritis of knee: Secondary | ICD-10-CM

## 2023-07-28 DIAGNOSIS — M25551 Pain in right hip: Secondary | ICD-10-CM | POA: Diagnosis not present

## 2023-07-28 DIAGNOSIS — I10 Essential (primary) hypertension: Secondary | ICD-10-CM

## 2023-07-28 DIAGNOSIS — E2839 Other primary ovarian failure: Secondary | ICD-10-CM

## 2023-07-28 DIAGNOSIS — M25552 Pain in left hip: Secondary | ICD-10-CM

## 2023-07-28 DIAGNOSIS — E7800 Pure hypercholesterolemia, unspecified: Secondary | ICD-10-CM

## 2023-07-28 DIAGNOSIS — M461 Sacroiliitis, not elsewhere classified: Secondary | ICD-10-CM

## 2023-07-28 MED ORDER — SPIRONOLACTONE 25 MG PO TABS: 25.0000 mg | ORAL_TABLET | Freq: Every day | ORAL | 1 refills | Status: DC

## 2023-07-28 MED ORDER — LOSARTAN POTASSIUM 100 MG PO TABS: 100.0000 mg | ORAL_TABLET | Freq: Every day | ORAL | 1 refills | Status: DC

## 2023-07-28 MED ORDER — CARVEDILOL 25 MG PO TABS: 25.0000 mg | ORAL_TABLET | Freq: Two times a day (BID) | ORAL | 1 refills | Status: DC

## 2023-07-28 MED ORDER — MELOXICAM 15 MG PO TABS: 7.5000 mg | ORAL_TABLET | Freq: Every day | ORAL | 1 refills | Status: DC | PRN

## 2023-07-28 MED ORDER — EZETIMIBE 10 MG PO TABS: 10.0000 mg | ORAL_TABLET | Freq: Every day | ORAL | 1 refills | Status: DC

## 2023-07-28 NOTE — Patient Instructions (Signed)
 VISIT SUMMARY:  Today, we discussed your worsening back and hip pain, which is likely due to osteoarthritis. We also reviewed your knee osteoarthritis, prediabetes, hypertension, and hyperlipidemia. We talked about your recent weight gain and its possible causes. Additionally, we discussed the importance of bone health and the need for a bone density test.  YOUR PLAN:  -BACK AND HIP PAIN: Your back and hip pain are likely due to osteoarthritis, which is a condition where the protective cartilage that cushions the ends of your bones wears down over time. You should continue taking meloxicam  as needed and use non-drug methods like hot or cold pads for relief. We are referring you to water therapy and physical therapy to help manage the pain. Remember to rest during physical activity when you feel fatigued.  -KNEE OSTEOARTHRITIS: Osteoarthritis in your knee is causing chronic pain. Continue taking meloxicam  as needed. We are also referring you to water therapy and physical therapy to help manage this condition.  -PREDIABETES: Prediabetes means your blood sugar levels are higher than normal but not high enough to be classified as diabetes. We will order an A1c test to check your current blood sugar levels. It's important to make dietary changes, such as reducing late-night eating and increasing your meals during the day.  -HYPERTENSION: Your blood pressure is well-controlled with your current medications. We will refill your prescriptions for carvedilol , losartan , and spironolactone .  -HYPERLIPIDEMIA: Hyperlipidemia means you have high levels of fats (lipids) in your blood, which can increase your risk of heart disease. You are taking ezetimibe  because you had muscle aches with other cholesterol medications. We will refill your ezetimibe  prescription and encourage you to take it daily. We also discussed making diet and exercise changes to help manage your cholesterol levels.  -BONE HEALTH: Bone health is  important, especially after menopause, to prevent osteoporosis, which is a condition where bones become weak and brittle. We are referring you for a bone density test to check your bone health.  INSTRUCTIONS:  Please follow up with the recommended water therapy and physical therapy for your back, hip, and knee pain. Make sure to get the A1c test done to monitor your blood sugar levels. Continue taking your medications as prescribed, and make the suggested dietary and lifestyle changes. Schedule a bone density test to assess your bone health.

## 2023-07-28 NOTE — Progress Notes (Addendum)
 Subjective:  Patient ID: Kaylee Nelson, female    DOB: 12-25-1955  Age: 68 y.o. MRN: [id number]  CC: Medical Management of Chronic Issues (Bilateral leg pain)     Discussed the use of AI scribe software for clinical note transcription with the patient, who gave verbal consent to proceed.  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  She experiences periodic back pain radiating downwards, causing fatigue during walking. The pain worsens with long-distance walking, such as in malls, and improves with rest and hot or cold pads. Hip pain also occurs with long-distance walking.   X-ray of the hips and pelvis from 06/2020 revealed: IMPRESSION: Mild osteoarthritis of the right hip, pubic symphysis and sacroiliac joints.  She takes meloxicam  for knee pain, which she describes as pressure. She uses a heating pad and cold pad for relief. She has not been taking tramadol  or iron pills.  Her medical history includes hypertension, managed with carvedilol , losartan , and spironolactone . She takes Zetia  for high cholesterol but has reduced the frequency due to concerns about pill burden. She previously experienced muscle aches with other cholesterol medications.  She has gained approximately 17 pounds since January.  She endorses decreased activity due to pain and changes in eating habits, including late-night eating. She was noted to be prediabetic in October with an A1c of 5.9.  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 Her daughter advises her to take more time for herself.  She continues to follow-up with her surgeons at [facility].    Past Medical History:  Diagnosis Date   Arthritis    bilateral knees   Blood transfusion without reported diagnosis  2007   Heart murmur    as a child   Hypertension    on meds   Phlegm in throat    LAST WEEK OR TWO NO FEVER   Sickle cell trait (HCC)    on preventative iron po meds    Past Surgical History:  Procedure Laterality Date   ABDOMINAL HYSTERECTOMY  2007   ADRENALECTOMY N/A 08/18/2018   Procedure: OPEN LEFT ADRENALECTOMY;  Surgeon: Eletha Boas, MD;  Location: [facility];  Service: General;  Laterality: N/A;   CESAREAN SECTION  1982   WISDOM TOOTH EXTRACTION  2017    Family History  Problem Relation Age of Onset   Cancer Mother    Breast cancer Mother    Cancer Maternal Aunt    Breast cancer Maternal Aunt    Cancer Daughter    Breast cancer Daughter    BRCA 1/2 Cousin    Colon polyps Neg Hx    Colon cancer Neg Hx    Esophageal cancer Neg Hx    Rectal cancer Neg Hx    Stomach cancer Neg Hx     Social History   Socioeconomic History   Marital status: Single    Spouse name: Not on file   Number of children: Not on file   Years of education: Not on file   Highest education level: Not on file  Occupational History   Not on file  Tobacco Use   Smoking status: Never   Smokeless tobacco: Never  Vaping Use   Vaping status: Never Used  Substance and Sexual Activity   Alcohol use: No  Alcohol/week: 0.0 standard drinks of alcohol   Drug use: No   Sexual activity: Never  Other Topics Concern   Not on file  Social History Narrative   Not on file   Social Drivers of Health   Financial Resource Strain: Low Risk  (12/21/2022)   Overall Financial Resource Strain (CARDIA)    Difficulty of Paying Living Expenses: Not hard at all  Food Insecurity: No Food Insecurity (02/22/2023)   Received from [facility]   Hunger Vital Sign    Within the past 12 months, you worried that your food would run out before you got the money to buy more.: Never true    Within the past 12 months, the food you bought just didn't last and you didn't have money to get more.: Never true   Transportation Needs: No Transportation Needs (02/22/2023)   Received from [facility] - Transportation    Lack of Transportation (Medical): No    Lack of Transportation (Non-Medical): No  Physical Activity: Inactive (12/21/2022)   Exercise Vital Sign    Days of Exercise per Week: 0 days    Minutes of Exercise per Session: 0 min  Stress: No Stress Concern Present (02/22/2023)   Received from [facility] - Occupational Stress Questionnaire    Feeling of Stress : Not at all  Social Connections: Unknown (03/02/2023)   Social Connection and Isolation Panel    Frequency of Communication with Friends and Family: Twice a week    Frequency of Social Gatherings with Friends and Family: More than three times a week    Attends Religious Services: Patient declined    Database administrator or Organizations: Patient declined    Attends Banker Meetings: Patient declined    Marital Status: Patient declined    Allergies  Allergen Reactions   Shrimp (Diagnostic) Hives, Itching, Swelling and Rash   Sulfa Antibiotics Hives, Itching and Rash   Shrimp [Shellfish Allergy]     Outpatient Medications Prior to Visit  Medication Sig Dispense Refill   furosemide  (LASIX ) 20 MG tablet Take 1 tablet (20 mg total) by mouth daily as needed. For pedal edema 30 tablet 3   carvedilol  (COREG ) 25 MG tablet Take 1 tablet (25 mg total) by mouth 2 (two) times daily with a meal. 180 tablet 1   ezetimibe  (ZETIA ) 10 MG tablet Take 1 tablet (10 mg total) by mouth daily. 90 tablet 1   losartan  (COZAAR ) 100 MG tablet Take 1 tablet (100 mg total) by mouth daily. 90 tablet 1   meloxicam  (MOBIC ) 15 MG tablet Take 0.5-1 tablets (7.5-15 mg total) by mouth daily as needed for pain. 30 tablet 1   spironolactone  (ALDACTONE ) 25 MG tablet Take 1 tablet (25 mg total) by mouth daily. 90 tablet 1   Artificial Tear Ointment (DRY EYES OP) Apply 2 drops to eye daily at 6  (six) AM. (Patient not taking: Reported on 03/02/2023)     CALCIUM  MAGNESIUM  ZINC PO Take 1 tablet by mouth daily at 6 (six) AM. (Patient not taking: Reported on 03/02/2023)     CALCIUM  PO Take 600 mg by mouth daily at 6 (six) AM. (Patient not taking: Reported on 03/02/2023)     Cyanocobalamin (VITAMIN B-12 PO) Take 5,000 mcg by mouth daily at 6 (six) AM. (Patient not taking: Reported on 03/02/2023)     Ferrous Sulfate (IRON PO) Take 18 mg by mouth daily at 6 (six) AM. (Patient  not taking: Reported on 03/02/2023)     PEG-KCl-NaCl-NaSulf-Na Asc-C (PLENVU ) 140 g SOLR Take 1 kit by mouth as directed. Generic okay-please use SingleCare or GoodRx for price if able; (Patient not taking: Reported on 07/28/2023) 1 each 0   traMADol  (ULTRAM ) 50 MG tablet Take 1 tablet (50 mg total) by mouth every 6 (six) hours as needed. (Patient not taking: Reported on 03/02/2023) 15 tablet 0   No facility-administered medications prior to visit.     ROS Review of Systems  Constitutional:  Negative for activity change and appetite change.  HENT:  Negative for sinus pressure and sore throat.   Respiratory:  Negative for chest tightness, shortness of breath and wheezing.   Cardiovascular:  Negative for chest pain and palpitations.  Gastrointestinal:  Negative for abdominal distention, abdominal pain and constipation.  Genitourinary: Negative.   Musculoskeletal:        See HPI  Psychiatric/Behavioral:  Negative for behavioral problems and dysphoric mood.     Objective:  BP 123/71   Pulse 94   Ht 5' 8 (1.727 m)   Wt 232 lb 3.2 oz (105.3 kg)   SpO2 98%   BMI 35.31 kg/m      07/28/2023    1:50 PM 03/02/2023    2:48 PM 12/21/2022    3:20 PM  BP/Weight  Systolic BP 123 114 131  Diastolic BP 71 67 76  Wt. (Lbs) 232.2 215.6 225.2  BMI 35.31 kg/m2 32.78 kg/m2 34.24 kg/m2    Wt Readings from Last 3 Encounters:  07/28/23 232 lb 3.2 oz (105.3 kg)  03/02/23 215 lb 9.6 oz (97.8 kg)  12/21/22 225 lb 3.2 oz (102.2 kg)       Physical Exam Constitutional:      Appearance: She is well-developed.   Cardiovascular:     Rate and Rhythm: Normal rate.     Heart sounds: Normal heart sounds. No murmur heard. Pulmonary:     Effort: Pulmonary effort is normal.     Breath sounds: Normal breath sounds. No wheezing or rales.  Chest:     Chest wall: No tenderness.  Abdominal:     General: Bowel sounds are normal. There is no distension.     Palpations: Abdomen is soft. There is no mass.     Tenderness: There is no abdominal tenderness.   Musculoskeletal:        General: Normal range of motion.     Right lower leg: No edema.     Left lower leg: Edema present.   Neurological:     Mental Status: She is alert and oriented to person, place, and time.   Psychiatric:        Mood and Affect: Mood normal.        Latest Ref Rng & Units 12/21/2022    4:15 PM 06/17/2022    2:48 PM 03/03/2022    9:36 AM  CMP  Glucose 70 - 99 mg/dL 84  890  87   BUN 8 - 27 mg/dL 7  9  13    Creatinine 0.57 - 1.00 mg/dL 9.05  9.05  8.93   Sodium 134 - 144 mmol/L 144  146  143   Potassium 3.5 - 5.2 mmol/L 4.5  4.3  4.2   Chloride 96 - 106 mmol/L 108  110  106   CO2 20 - 29 mmol/L 21  23  20    Calcium  8.7 - 10.3 mg/dL 9.6  9.5  9.6   Total Protein 6.0 - 8.5  g/dL 7.0  6.8  7.7   Total Bilirubin 0.0 - 1.2 mg/dL 0.3  0.3  0.3   Alkaline Phos 44 - 121 IU/L 106  91  88   AST 0 - 40 IU/L 23  13  18    ALT 0 - 32 IU/L 15  12  10      Lipid Panel     Component Value Date/Time   CHOL 199 03/03/2022 0936   TRIG 46 03/03/2022 0936   HDL 58 03/03/2022 0936   CHOLHDL 3.6 07/25/2020 0909   CHOLHDL 2.8 12/17/2015 1035   VLDL 10 12/17/2015 1035   LDLCALC 132 (H) 03/03/2022 0936    CBC    Component Value Date/Time   WBC 4.0 03/03/2022 0936   WBC 9.7 08/23/2018 1255   RBC 4.68 03/03/2022 0936   RBC 3.59 (L) 08/23/2018 1255   HGB 12.7 03/03/2022 0936   HCT 38.0 03/03/2022 0936   PLT 224 03/03/2022 0936   MCV 81 03/03/2022 0936    MCH 27.1 03/03/2022 0936   MCH 28.7 08/23/2018 1255   MCHC 33.4 03/03/2022 0936   MCHC 32.2 08/23/2018 1255   RDW 12.8 03/03/2022 0936   LYMPHSABS 1.0 03/03/2022 0936   MONOABS 1.2 (H) 08/20/2018 0755   EOSABS 0.1 03/03/2022 0936   BASOSABS 0.0 03/03/2022 0936    Lab Results  Component Value Date   HGBA1C 5.9 (A) 11/17/2022      1. Essential hypertension Controlled Continue current regimen - carvedilol  (COREG ) 25 MG tablet; Take 1 tablet (25 mg total) by mouth 2 (two) times daily with a meal.  Dispense: 180 tablet; Refill: 1 - losartan  (COZAAR ) 100 MG tablet; Take 1 tablet (100 mg total) by mouth daily.  Dispense: 90 tablet; Refill: 1 - spironolactone  (ALDACTONE ) 25 MG tablet; Take 1 tablet (25 mg total) by mouth daily.  Dispense: 90 tablet; Refill: 1 - CMP14+EGFR  2. Pure hypercholesterolemia LDL above goal Statin intolerance She has been taking Zetia  every other day but has been encouraged to take this regularly - ezetimibe  (ZETIA ) 10 MG tablet; Take 1 tablet (10 mg total) by mouth daily.  Dispense: 90 tablet; Refill: 1  3. Bilateral hip pain Sacroiliac osteoarthritis Offered to refer to orthopedic for cortisone injection but she declines Will refer to aquatic therapy - Ambulatory referral to Physical Therapy - meloxicam  (MOBIC ) 15 MG tablet; Take 0.5-1 tablets (7.5-15 mg total) by mouth daily as needed for pain.  Dispense: 30 tablet; Refill: 1  4. Primary osteoarthritis of both knees (Primary) Uncontrolled Currently on meloxicam  Will refer for aquatic therapy - Ambulatory referral to Physical Therapy  5. Osteoarthritis of both sacroiliac joints (HCC) See #3 above - Ambulatory referral to Physical Therapy  6. Prediabetes Labs reveal prediabetes with an A1c of 5.9.  An A1c of 6.5 is supportive of a diagnosis of type 2 diabetes mellitus.  Working on a low carbohydrate diet, exercise, weight loss is recommended in order to prevent progression to type 2 diabetes  mellitus.  - Hemoglobin A1c  7. Estrogen deficiency - DG Bone Density; Future   Meds ordered this encounter  Medications   carvedilol  (COREG ) 25 MG tablet    Sig: Take 1 tablet (25 mg total) by mouth 2 (two) times daily with a meal.    Dispense:  180 tablet    Refill:  1   ezetimibe  (ZETIA ) 10 MG tablet    Sig: Take 1 tablet (10 mg total) by mouth daily.    Dispense:  90  tablet    Refill:  1   losartan  (COZAAR ) 100 MG tablet    Sig: Take 1 tablet (100 mg total) by mouth daily.    Dispense:  90 tablet    Refill:  1   meloxicam  (MOBIC ) 15 MG tablet    Sig: Take 0.5-1 tablets (7.5-15 mg total) by mouth daily as needed for pain.    Dispense:  30 tablet    Refill:  1   spironolactone  (ALDACTONE ) 25 MG tablet    Sig: Take 1 tablet (25 mg total) by mouth daily.    Dispense:  90 tablet    Refill:  1    Follow-up: Return in about 6 months (around 01/27/2024) for Chronic medical conditions.       Corrina Sabin, MD, FAAFP. [facility] [place], [place] [phone]   07/28/2023, 2:48 PM

## 2023-07-29 ENCOUNTER — Ambulatory Visit: Payer: Self-pay | Admitting: Family Medicine

## 2023-07-29 LAB — CMP14+EGFR
ALT: 16 IU/L (ref 0–32)
AST: 19 IU/L (ref 0–40)
Albumin: 4.1 g/dL (ref 3.9–4.9)
Alkaline Phosphatase: 82 IU/L (ref 44–121)
BUN/Creatinine Ratio: 9 — ABNORMAL LOW (ref 12–28)
BUN: 9 mg/dL (ref 8–27)
Bilirubin Total: 0.3 mg/dL (ref 0.0–1.2)
CO2: 21 mmol/L (ref 20–29)
Calcium: 9.2 mg/dL (ref 8.7–10.3)
Chloride: 111 mmol/L — ABNORMAL HIGH (ref 96–106)
Creatinine, Ser: 0.95 mg/dL (ref 0.57–1.00)
Globulin, Total: 2.5 g/dL (ref 1.5–4.5)
Glucose: 90 mg/dL (ref 70–99)
Potassium: 4.1 mmol/L (ref 3.5–5.2)
Sodium: 148 mmol/L — ABNORMAL HIGH (ref 134–144)
Total Protein: 6.6 g/dL (ref 6.0–8.5)
eGFR: 66 mL/min/{1.73_m2} (ref >59)

## 2023-07-29 LAB — HEMOGLOBIN A1C
Est. average glucose Bld gHb Est-mCnc: 120 mg/dL
Hgb A1c MFr Bld: 5.8 % — ABNORMAL HIGH (ref 4.8–5.6)

## 2023-09-13 ENCOUNTER — Ambulatory Visit (HOSPITAL_BASED_OUTPATIENT_CLINIC_OR_DEPARTMENT_OTHER): Attending: Family Medicine | Admitting: Physical Therapy

## 2023-10-26 ENCOUNTER — Encounter: Payer: Self-pay | Admitting: Family Medicine

## 2023-12-06 ENCOUNTER — Encounter: Payer: Self-pay | Admitting: Radiology

## 2024-02-04 ENCOUNTER — Telehealth: Payer: Self-pay | Admitting: Family Medicine

## 2024-02-04 NOTE — Telephone Encounter (Signed)
 Contacted pt left vm to confirmed appt

## 2024-02-07 ENCOUNTER — Ambulatory Visit: Attending: Family Medicine | Admitting: Family Medicine

## 2024-02-07 ENCOUNTER — Encounter: Payer: Self-pay | Admitting: Family Medicine

## 2024-02-07 VITALS — BP 135/81 | HR 89 | Temp 97.8°F | Ht 68.0 in | Wt 229.0 lb

## 2024-02-07 DIAGNOSIS — E7800 Pure hypercholesterolemia, unspecified: Secondary | ICD-10-CM

## 2024-02-07 DIAGNOSIS — M25552 Pain in left hip: Secondary | ICD-10-CM

## 2024-02-07 DIAGNOSIS — R002 Palpitations: Secondary | ICD-10-CM | POA: Diagnosis not present

## 2024-02-07 DIAGNOSIS — M25551 Pain in right hip: Secondary | ICD-10-CM

## 2024-02-07 DIAGNOSIS — R9431 Abnormal electrocardiogram [ECG] [EKG]: Secondary | ICD-10-CM

## 2024-02-07 DIAGNOSIS — I10 Essential (primary) hypertension: Secondary | ICD-10-CM

## 2024-02-07 DIAGNOSIS — M170 Bilateral primary osteoarthritis of knee: Secondary | ICD-10-CM

## 2024-02-07 MED ORDER — CARVEDILOL 25 MG PO TABS: 25.0000 mg | ORAL_TABLET | Freq: Two times a day (BID) | ORAL | 1 refills | Status: AC

## 2024-02-07 MED ORDER — EZETIMIBE 10 MG PO TABS: 10.0000 mg | ORAL_TABLET | Freq: Every day | ORAL | 1 refills | Status: AC

## 2024-02-07 MED ORDER — LOSARTAN POTASSIUM 100 MG PO TABS: 100.0000 mg | ORAL_TABLET | Freq: Every day | ORAL | 1 refills | Status: AC

## 2024-02-07 MED ORDER — SPIRONOLACTONE 25 MG PO TABS: 25.0000 mg | ORAL_TABLET | Freq: Every day | ORAL | 1 refills | Status: AC

## 2024-02-07 MED ORDER — MELOXICAM 15 MG PO TABS: 7.5000 mg | ORAL_TABLET | Freq: Every day | ORAL | 1 refills | Status: DC | PRN

## 2024-02-07 MED ORDER — NAPROXEN 500 MG PO TABS: 500.0000 mg | ORAL_TABLET | Freq: Two times a day (BID) | ORAL | 1 refills | Status: AC

## 2024-02-07 NOTE — Patient Instructions (Signed)
 Placed in [facility]  [address]  [place] [address]Ph# [phone] Fax [phone]

## 2024-02-07 NOTE — Progress Notes (Signed)
 "  Subjective:  Patient ID: Kaylee Nelson, female    DOB: [birth year]  Age: 69 y.o. MRN: [id number]  CC: Medical Management of Chronic Issues     Discussed the use of AI scribe software for clinical note transcription with the patient, who gave verbal consent to proceed.  History of Present Illness Kaylee Nelson is a 69 year old female with osteoarthritis who presents with worsening knee and back pain.  She reports progressive knee pain despite meloxicam , brace use, and topical analgesics, and feels her function is declining. She previously had effective knee injections and an oral medication she cannot name but has not received injections since moving and missed an orthopedics visit that she has been unable to reschedule.  She was also referred for aquatic therapy but missed that appointment.  X-rays reveal tricompartmental osteoarthritis.  Cold or rainy weather worsens her pain, and she has episodes of sharp pain in her back.  Her back pain worsens with prolonged standing and she believes it is related to compensating for her knee pain. She had x-rays in 2022 but no recent imaging.  Since last month she has had episodes of chest fluttering and shortness of breath, mainly when upset, which feel like her heart is racing and improve when she takes deep breaths.  She has not noticed leg swelling and uses furosemide  as needed for fluid retention.  Endorses adherence with her antihypertensives.    Past Medical History:  Diagnosis Date   Arthritis    bilateral knees   Blood transfusion without reported diagnosis 2007   Heart murmur    as a child   Hypertension    on meds   Phlegm in throat    LAST WEEK OR TWO NO FEVER   Sickle cell trait    on preventative iron po meds    Past Surgical History:  Procedure Laterality Date   ABDOMINAL HYSTERECTOMY  2007   ADRENALECTOMY N/A 08/18/2018   Procedure: OPEN LEFT ADRENALECTOMY;  Surgeon: Eletha Boas, MD;  Location: [facility];  Service: General;  Laterality: N/A;   CESAREAN SECTION  1982   WISDOM TOOTH EXTRACTION  2017    Family History  Problem Relation Age of Onset   Cancer Mother    Breast cancer Mother    Cancer Maternal Aunt    Breast cancer Maternal Aunt    Cancer Daughter    Breast cancer Daughter    BRCA 1/2 Cousin    Colon polyps Neg Hx    Colon cancer Neg Hx    Esophageal cancer Neg Hx    Rectal cancer Neg Hx    Stomach cancer Neg Hx     Social History   Socioeconomic History   Marital status: Single    Spouse name: Not on file   Number of children: Not on file   Years of education: Not on file   Highest education level: Not on file  Occupational History   Not on file  Tobacco Use   Smoking status: Never   Smokeless tobacco: Never  Vaping Use   Vaping status: Never Used  Substance and Sexual Activity   Alcohol use: No    Alcohol/week: 0.0 standard drinks of alcohol   Drug use: No   Sexual activity: Never  Other Topics Concern   Not on file  Social History Narrative   Not on file   Social Drivers of Health   Tobacco Use: Low Risk (02/07/2024)   Patient History  Smoking Tobacco Use: Never    Smokeless Tobacco Use: Never    Passive Exposure: Not on file  Financial Resource Strain: Low Risk (12/21/2022)   Overall Financial Resource Strain (CARDIA)    Difficulty of Paying Living Expenses: Not hard at all  Food Insecurity: No Food Insecurity (02/22/2023)   [facility]   Epic    Within the past 12 months, you worried that your food would run out before you got the money to buy more.: Never true    Within the past 12 months, the food you bought just didn't last and you didn't have money to get more.: Never true  Transportation Needs: No Transportation Needs (02/22/2023)   Received from [facility] - Transportation    Lack of Transportation (Medical): No    Lack of Transportation (Non-Medical): No  Physical Activity: Inactive (12/21/2022)    Exercise Vital Sign    Days of Exercise per Week: 0 days    Minutes of Exercise per Session: 0 min  Stress: No Stress Concern Present (02/22/2023)   Received from [facility] - Occupational Stress Questionnaire    Feeling of Stress : Not at all  Social Connections: Unknown (03/02/2023)   Social Connection and Isolation Panel    Frequency of Communication with Friends and Family: Twice a week    Frequency of Social Gatherings with Friends and Family: More than three times a week    Attends Religious Services: Patient declined    Active Member of Clubs or Organizations: Patient declined    Attends Banker Meetings: Patient declined    Marital Status: Patient declined  Depression (PHQ2-9): Low Risk (07/28/2023)   Depression (PHQ2-9)    PHQ-2 Score: 0  Alcohol Screen: Low Risk (12/21/2022)   Alcohol Screen    Last Alcohol Screening Score (AUDIT): 0  Housing: Unknown (07/14/2023)   Received from [facility]   Epic    At any time in the past 12 months, were you homeless or living in a shelter (including now)?: No    Unable to Pay for Housing in the Last Year: Not on file    Number of Times Moved in the Last Year: Not on file  Utilities: Not At Risk (02/22/2023)   Received from [facility]    Threatened with loss of utilities: No  Health Literacy: Adequate Health Literacy (12/21/2022)   B1300 Health Literacy    Frequency of need for help with medical instructions: Never    Allergies[1]  Outpatient Medications Prior to Visit  Medication Sig Dispense Refill   furosemide  (LASIX ) 20 MG tablet Take 1 tablet (20 mg total) by mouth daily as needed. For pedal edema 30 tablet 3   carvedilol  (COREG ) 25 MG tablet Take 1 tablet (25 mg total) by mouth 2 (two) times daily with a meal. 180 tablet 1   ezetimibe  (ZETIA ) 10 MG tablet Take 1 tablet (10 mg total) by mouth daily. 90 tablet 1   losartan  (COZAAR )  100 MG tablet Take 1 tablet (100 mg total) by mouth daily. 90 tablet 1   meloxicam  (MOBIC ) 15 MG tablet Take 0.5-1 tablets (7.5-15 mg total) by mouth daily as needed for pain. 30 tablet 1   spironolactone  (ALDACTONE ) 25 MG tablet Take 1 tablet (25 mg total) by mouth daily. 90 tablet 1   No facility-administered medications prior to visit.     ROS Review of Systems  Constitutional:  Negative for activity change and appetite change.  HENT:  Negative for sinus pressure and sore throat.   Respiratory:  Negative for chest tightness, shortness of breath and wheezing.   Cardiovascular:  Negative for chest pain and palpitations.  Gastrointestinal:  Negative for abdominal distention, abdominal pain and constipation.  Genitourinary: Negative.   Musculoskeletal: Negative.   Psychiatric/Behavioral:  Negative for behavioral problems and dysphoric mood.     Objective:  BP 135/81   Pulse 89   Temp 97.8 F (36.6 C) (Oral)   Ht 5' 8 (1.727 m)   Wt 229 lb (103.9 kg)   SpO2 98%   BMI 34.82 kg/m      02/07/2024    4:21 PM 02/07/2024    3:42 PM 07/28/2023    1:50 PM  BP/Weight  Systolic BP 135 145 123  Diastolic BP 81 72 71  Wt. (Lbs)  229 232.2  BMI  34.82 kg/m2 35.31 kg/m2      Physical Exam Constitutional:      Appearance: She is well-developed.  Cardiovascular:     Rate and Rhythm: Normal rate.     Heart sounds: Normal heart sounds. No murmur heard. Pulmonary:     Effort: Pulmonary effort is normal.     Breath sounds: Normal breath sounds. No wheezing or rales.  Chest:     Chest wall: No tenderness.  Abdominal:     General: Bowel sounds are normal. There is no distension.     Palpations: Abdomen is soft. There is no mass.     Tenderness: There is no abdominal tenderness.  Musculoskeletal:        General: Normal range of motion.     Right lower leg: No edema.     Left lower leg: No edema.     Comments: Genu varum  bilaterally No tenderness on palpation of lumbar spine.   Negative straight leg raise bilaterally  Neurological:     Mental Status: She is alert and oriented to person, place, and time.  Psychiatric:        Mood and Affect: Mood normal.        Latest Ref Rng & Units 07/28/2023    2:22 PM 12/21/2022    4:15 PM 06/17/2022    2:48 PM  CMP  Glucose 70 - 99 mg/dL 90  84  890   BUN 8 - 27 mg/dL 9  7  9    Creatinine 0.57 - 1.00 mg/dL 9.04  9.05  9.05   Sodium 134 - 144 mmol/L 148  144  146   Potassium 3.5 - 5.2 mmol/L 4.1  4.5  4.3   Chloride 96 - 106 mmol/L 111  108  110   CO2 20 - 29 mmol/L 21  21  23    Calcium  8.7 - 10.3 mg/dL 9.2  9.6  9.5   Total Protein 6.0 - 8.5 g/dL 6.6  7.0  6.8   Total Bilirubin 0.0 - 1.2 mg/dL 0.3  0.3  0.3   Alkaline Phos 44 - 121 IU/L 82  106  91   AST 0 - 40 IU/L 19  23  13    ALT 0 - 32 IU/L 16  15  12      Lipid Panel     Component Value Date/Time   CHOL 199 03/03/2022 0936   TRIG 46 03/03/2022 0936   HDL 58 03/03/2022 0936   CHOLHDL 3.6 07/25/2020 0909   CHOLHDL 2.8 12/17/2015 1035   VLDL 10 12/17/2015 1035  LDLCALC 132 (H) 03/03/2022 0936    CBC    Component Value Date/Time   WBC 4.0 03/03/2022 0936   WBC 9.7 08/23/2018 1255   RBC 4.68 03/03/2022 0936   RBC 3.59 (L) 08/23/2018 1255   HGB 12.7 03/03/2022 0936   HCT 38.0 03/03/2022 0936   PLT 224 03/03/2022 0936   MCV 81 03/03/2022 0936   MCH 27.1 03/03/2022 0936   MCH 28.7 08/23/2018 1255   MCHC 33.4 03/03/2022 0936   MCHC 32.2 08/23/2018 1255   RDW 12.8 03/03/2022 0936   LYMPHSABS 1.0 03/03/2022 0936   MONOABS 1.2 (H) 08/20/2018 0755   EOSABS 0.1 03/03/2022 0936   BASOSABS 0.0 03/03/2022 0936    Lab Results  Component Value Date   HGBA1C 5.8 (H) 07/28/2023       Assessment & Plan Palpitations with abnormal EKG EKG shows new ST depression in V2 which was absent on previous EKG.  Anxiety may contribute to symptoms, but EKG findings require further investigation. - Ordered referral to cardiologist for evaluation and stress  test.  Osteoarthritis of knees, hips, and lumbar spine Chronic osteoarthritis with worsening knee and compensatory back pain. Previous knee injections provided relief. Current management with meloxicam  inconsistent. She prefers to avoid surgery. - Referred to aquatic therapy for joint pain relief. - She said she did naproxen  in the past which was more effective and so I have switched from meloxicam  to naproxen  - Provided orthopedics contact for potential knee injections. - Encouraged use of knee brace and topical treatments like Voltaren gel.  Essential hypertension Blood pressure slightly elevated during visit. - Rechecked blood pressure before leaving office revealed improvement. -Continue current regimen -Counseled on blood pressure goal of less than 130/80, low-sodium, DASH diet, medication compliance, 150 minutes of moderate intensity exercise per week. Discussed medication compliance, adverse effects.   Hyperlipidemia Cholesterol levels not checked recently.  - Scheduled fasting blood work on Friday for cholesterol, kidney, and liver function.       Meds ordered this encounter  Medications   carvedilol  (COREG ) 25 MG tablet    Sig: Take 1 tablet (25 mg total) by mouth 2 (two) times daily with a meal.    Dispense:  180 tablet    Refill:  1   ezetimibe  (ZETIA ) 10 MG tablet    Sig: Take 1 tablet (10 mg total) by mouth daily.    Dispense:  90 tablet    Refill:  1   losartan  (COZAAR ) 100 MG tablet    Sig: Take 1 tablet (100 mg total) by mouth daily.    Dispense:  90 tablet    Refill:  1   meloxicam  (MOBIC ) 15 MG tablet    Sig: Take 0.5-1 tablets (7.5-15 mg total) by mouth daily as needed for pain.    Dispense:  30 tablet    Refill:  1   spironolactone  (ALDACTONE ) 25 MG tablet    Sig: Take 1 tablet (25 mg total) by mouth daily.    Dispense:  90 tablet    Refill:  1    Follow-up: Return in about 6 months (around 08/06/2024) for Chronic medical conditions.        Corrina Sabin, MD, FAAFP. [facility] [place], [place] [phone]   02/07/2024, 4:36 PM    [1]  Allergies Allergen Reactions   Shrimp (Diagnostic) Hives, Itching, Swelling and Rash   Sulfa Antibiotics Hives, Itching and Rash   Shrimp [Shellfish Allergy]    "

## 2024-02-08 DIAGNOSIS — M1990 Unspecified osteoarthritis, unspecified site: Secondary | ICD-10-CM | POA: Insufficient documentation

## 2024-02-08 DIAGNOSIS — R011 Cardiac murmur, unspecified: Secondary | ICD-10-CM | POA: Insufficient documentation

## 2024-02-08 DIAGNOSIS — D573 Sickle-cell trait: Secondary | ICD-10-CM | POA: Insufficient documentation

## 2024-02-08 DIAGNOSIS — R0989 Other specified symptoms and signs involving the circulatory and respiratory systems: Secondary | ICD-10-CM | POA: Insufficient documentation

## 2024-02-11 ENCOUNTER — Ambulatory Visit: Attending: Family Medicine

## 2024-02-11 DIAGNOSIS — I10 Essential (primary) hypertension: Secondary | ICD-10-CM

## 2024-02-11 DIAGNOSIS — E7800 Pure hypercholesterolemia, unspecified: Secondary | ICD-10-CM

## 2024-02-11 DIAGNOSIS — R002 Palpitations: Secondary | ICD-10-CM

## 2024-02-12 LAB — CMP14+EGFR
ALT: 13 IU/L (ref 0–32)
AST: 15 IU/L (ref 0–40)
Albumin: 4 g/dL (ref 3.9–4.9)
Alkaline Phosphatase: 105 IU/L (ref 49–135)
BUN/Creatinine Ratio: 7 — ABNORMAL LOW (ref 12–28)
BUN: 7 mg/dL — ABNORMAL LOW (ref 8–27)
Bilirubin Total: 0.4 mg/dL (ref 0.0–1.2)
CO2: 22 mmol/L (ref 20–29)
Calcium: 9.1 mg/dL (ref 8.7–10.3)
Chloride: 110 mmol/L — ABNORMAL HIGH (ref 96–106)
Creatinine, Ser: 1.02 mg/dL — ABNORMAL HIGH (ref 0.57–1.00)
Globulin, Total: 2.8 g/dL (ref 1.5–4.5)
Glucose: 95 mg/dL (ref 70–99)
Potassium: 3.9 mmol/L (ref 3.5–5.2)
Sodium: 146 mmol/L — ABNORMAL HIGH (ref 134–144)
Total Protein: 6.8 g/dL (ref 6.0–8.5)
eGFR: 60 mL/min/1.73

## 2024-02-12 LAB — LP+NON-HDL CHOLESTEROL
Cholesterol, Total: 178 mg/dL (ref 100–199)
HDL: 53 mg/dL
LDL Chol Calc (NIH): 114 mg/dL — ABNORMAL HIGH (ref 0–99)
Total Non-HDL-Chol (LDL+VLDL): 125 mg/dL (ref 0–129)
Triglycerides: 55 mg/dL (ref 0–149)
VLDL Cholesterol Cal: 11 mg/dL (ref 5–40)

## 2024-02-12 LAB — TSH: TSH: 0.669 u[IU]/mL (ref 0.450–4.500)

## 2024-02-12 LAB — T4, FREE: Free T4: 0.96 ng/dL (ref 0.82–1.77)

## 2024-02-12 LAB — T3: T3, Total: 95 ng/dL (ref 71–180)

## 2024-02-14 ENCOUNTER — Ambulatory Visit: Payer: Self-pay | Admitting: Family Medicine

## 2024-02-16 ENCOUNTER — Ambulatory Visit

## 2024-02-16 VITALS — BP 128/84 | HR 97 | Ht 68.0 in | Wt 224.1 lb

## 2024-02-16 DIAGNOSIS — R9431 Abnormal electrocardiogram [ECG] [EKG]: Secondary | ICD-10-CM | POA: Diagnosis not present

## 2024-02-16 DIAGNOSIS — R002 Palpitations: Secondary | ICD-10-CM | POA: Insufficient documentation

## 2024-02-16 DIAGNOSIS — E782 Mixed hyperlipidemia: Secondary | ICD-10-CM | POA: Diagnosis not present

## 2024-02-16 DIAGNOSIS — I10 Essential (primary) hypertension: Secondary | ICD-10-CM | POA: Diagnosis not present

## 2024-02-16 NOTE — Assessment & Plan Note (Signed)
 Frequent palpitations recently over the past few weeks notably when she is emotionally upset. Nonspecific EKG abnormalities at PCPs office not evident on today's EKG, likely related to lead placement.  Has longstanding history of hypertension and associated bilateral lower extremity edema infrequently requiring Lasix .  Will proceed with transthoracic echocardiogram to rule out any cardiac structural and functional abnormalities. Will obtain Zio patch for 14 days.

## 2024-02-16 NOTE — Patient Instructions (Signed)
 Medication Instructions:  Your physician recommends that you continue on your current medications as directed. Please refer to the Current Medication list given to you today.  *If you need a refill on your cardiac medications before your next appointment, please call your pharmacy*  Lab Work: None If you have labs (blood work) drawn today and your tests are completely normal, you will receive your results only by: MyChart Message (if you have MyChart) OR A paper copy in the mail If you have any lab test that is abnormal or we need to change your treatment, we will call you to review the results.  Testing/Procedures: A zio monitor was ordered today. It will remain on for 14 days. You will then return monitor and event diary in provided box. It takes 1-2 weeks for report to be downloaded and returned to us . We will call you with the results. If monitor falls off or has orange flashing light, please call Zio for further instructions.   Your physician has requested that you have an echocardiogram. Echocardiography is a painless test that uses sound waves to create images of your heart. It provides your doctor with information about the size and shape of your heart and how well your heart's chambers and valves are working. This procedure takes approximately one hour. There are no restrictions for this procedure. Please do NOT wear cologne, perfume, aftershave, or lotions (deodorant is allowed). Please arrive 15 minutes prior to your appointment time.  Please note: We ask at that you not bring children with you during ultrasound (echo/ vascular) testing. Due to room size and safety concerns, children are not allowed in the ultrasound rooms during exams. Our front office staff cannot provide observation of children in our lobby area while testing is being conducted. An adult accompanying a patient to their appointment will only be allowed in the ultrasound room at the discretion of the ultrasound  technician under special circumstances. We apologize for any inconvenience.   Follow-Up: At [facility], you and your health needs are our priority.  As part of our continuing mission to provide you with exceptional heart care, our providers are all part of one team.  This team includes your primary Cardiologist (physician) and Advanced Practice Providers or APPs (Physician Assistants and Nurse Practitioners) who all work together to provide you with the care you need, when you need it.  Your next appointment:   Follow up based on test results.  Provider:   Alean Kobus, MD    We recommend signing up for the patient portal called MyChart.  Sign up information is provided on this After Visit Summary.  MyChart is used to connect with patients for Virtual Visits (Telemedicine).  Patients are able to view lab/test results, encounter notes, upcoming appointments, etc.  Non-urgent messages can be sent to your provider as well.   To learn more about what you can do with MyChart, go to [email or web address].   Other Instructions None

## 2024-02-16 NOTE — Assessment & Plan Note (Addendum)
 Well-controlled. Continue carvedilol  25 mg twice daily Losartan  100 mg once daily Spironolactone  25 mg once daily.  Given her symptoms of bilateral lower extremity edema suspicious for hypertensive heart disease with diastolic dysfunction.  Follow-up with transthoracic echocardiogram. Reduce salt intake less than 2 g/day. Use Lasix  20 mg as needed for any significant pedal edema.

## 2024-02-16 NOTE — Assessment & Plan Note (Signed)
 Management as per PCP. Recent lipid panel reviewed. Currently on Zetia  10 mg once daily.

## 2024-02-16 NOTE — Progress Notes (Signed)
 "  Cardiology Consultation:    Date:  02/16/2024   ID:  Kaylee Nelson, DOB 12-27-1955, MRN [id number]  PCP:  Delbert Clam, MD  Cardiologist:  Alean JONELLE Kobus, MD   Referring MD: Delbert Clam, MD   No chief complaint on file.    ASSESSMENT AND PLAN:   Ms. Capers 69 year old woman with no significant prior cardiac history. Reports longstanding history of hypertension, hyperlipidemia, obesity, sickle cell trait, osteoarthritis of the hip and the knee.  Also takes furosemide  20 mg on an as-needed basis for any ankle edema and the last dose was 2 months ago.  Here for further evaluation of palpitations and abnormal.  Problem List Items Addressed This Visit       Cardiovascular and Mediastinum   Essential hypertension   Well-controlled. Continue carvedilol  25 mg twice daily Losartan  100 mg once daily Spironolactone  25 mg once daily.  Given her symptoms of bilateral lower extremity edema suspicious for hypertensive heart disease with diastolic dysfunction.  Follow-up with transthoracic echocardiogram. Reduce salt intake less than 2 g/day. Use Lasix  20 mg as needed for any significant pedal edema.      Relevant Orders   ECHOCARDIOGRAM COMPLETE     Other   Hyperlipidemia   Management as per PCP. Recent lipid panel reviewed. Currently on Zetia  10 mg once daily.      Palpitations - Primary   Frequent palpitations recently over the past few weeks notably when she is emotionally upset. Nonspecific EKG abnormalities at PCPs office not evident on today's EKG, likely related to lead placement.  Has longstanding history of hypertension and associated bilateral lower extremity edema infrequently requiring Lasix .  Will proceed with transthoracic echocardiogram to rule out any cardiac structural and functional abnormalities. Will obtain Zio patch for 14 days.       Relevant Orders   EKG 12-Lead (Completed)   LONG TERM MONITOR (3-14 DAYS)   ECHOCARDIOGRAM  COMPLETE   Other Visit Diagnoses       Abnormal EKG       Relevant Orders   EKG 12-Lead (Completed)   ECHOCARDIOGRAM COMPLETE      Return to clinic based on test results.   History of Present Illness:    Kaylee Nelson is a 69 y.o. female who is being seen today for the evaluation of palpitations at the request of Delbert Clam, MD.  Pleasant woman here for the visit by herself.  Lives with her husband at home. Works as a optician, dispensing.  Keeps herself busy with day-to-day activities at home and work.  Does not routinely exercise.  Has a history of hypertension, hyperlipidemia, obesity, sickle cell trait, osteoarthritis of the hip and knee.  Reports using Lasix  as needed for bilateral ankle edema. No prior history of CAD, CHF, MI, CVA. No prior echocardiogram or stress test. Does not smoke or drink alcohol.  Mentions over the past few weeks she had symptoms of fluttering in the chest and fast heartbeat associated with a sense of shortness of breath, typically precipitated by being upset with her husband. She tries to manage these by taking deep breaths which typically help calm her symptoms down.  She has been chronically using furosemide  20 mg on an as-needed basis for pedal edema.  Last taken a dose 2 months ago.  Denies any chest pain, orthopnea, paroxysmal nocturnal dyspnea. No blood in urine or stools. Over all mentions good functional capacity at baseline somewhat limited due to musculoskeletal pain in her back and legs.  EKG in the clinic today shows sinus rhythm heart rate 97/min, PR interval 196 ms, QRS duration 90 ms, QTc 477 ms no ischemic changes.In comparison EKG from PCPs office 02/07/2024 noted sinus rhythm with T wave inversions in lead V1 V2.  Appears to be lead positional change.  Blood work from 02/11/2024 recently done at PCPs office notes BUN 7, creatinine 1.02, eGFR 60 Sodium 146 and potassium 3.9 Normal transaminases and alkaline  phosphatase. Thyroid  panel was normal. Lipid levels show total cholesterol 178, triglycerides 55, HDL 53 and LDL 114.  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   Seasonal allergies 05/19/2016   Sickle cell trait    on preventative iron po meds    Past Surgical History:  Procedure Laterality Date   ABDOMINAL HYSTERECTOMY  2007   ADRENALECTOMY N/A 08/18/2018   Procedure: OPEN LEFT ADRENALECTOMY;  Surgeon: Eletha Boas, MD;  Location: [facility];  Service: General;  Laterality: N/A;   CESAREAN SECTION  1982   WISDOM TOOTH EXTRACTION  2017    Current Medications: Active Medications[1]   Allergies:   Shrimp (diagnostic), Sulfa antibiotics, and Shrimp [shellfish allergy]   Social History   Socioeconomic History   Marital status: Single    Spouse name: Not on file   Number of children: Not on file   Years of education: Not on file   Highest education level: Not on file  Occupational History   Not on file  Tobacco Use   Smoking status: Never   Smokeless tobacco: Never  Vaping Use   Vaping status: Never Used  Substance and Sexual Activity   Alcohol use: No    Alcohol/week: 0.0 standard drinks of alcohol   Drug use: No   Sexual activity: Never  Other Topics Concern   Not on file  Social History Narrative   Not on file   Social Drivers of Health   Tobacco Use: Low Risk (02/16/2024)   Patient History    Smoking Tobacco Use: Never    Smokeless Tobacco Use: Never    Passive Exposure: Not on file  Financial Resource Strain: Low Risk (12/21/2022)    Overall Financial Resource Strain (CARDIA)    Difficulty of Paying Living Expenses: Not hard at all  Food Insecurity: No Food Insecurity (02/22/2023)   Received from [facility]   Epic    Within the past 12 months, you worried that your food would run out before you got the money to buy more.: Never true    Within the past 12 months, the food you bought just didn't last and you didn't have money to get more.: Never true  Transportation Needs: No Transportation Needs (02/22/2023)   Received from [facility] - Transportation    Lack of Transportation (Medical): No    Lack of Transportation (Non-Medical): No  Physical Activity: Inactive (12/21/2022)   Exercise Vital Sign    Days of Exercise per Week: 0 days    Minutes of Exercise per Session: 0 min  Stress: No Stress Concern Present (02/22/2023)   Received from [facility] - Occupational Stress Questionnaire    Feeling of Stress : Not at all  Social Connections: Unknown (03/02/2023)   Social Connection and Isolation Panel    Frequency of Communication with Friends and Family: Twice a week    Frequency of Social Gatherings with Friends and Family: More than three times a week    Attends Religious Services: Patient declined    Active Member of Clubs or Organizations: Patient declined    Attends Banker Meetings: Patient declined    Marital Status: Patient declined  Depression (PHQ2-9): Low Risk (07/28/2023)   Depression (PHQ2-9)    PHQ-2 Score: 0  Alcohol Screen: Low Risk (12/21/2022)   Alcohol Screen    Last Alcohol Screening Score (AUDIT): 0  Housing: Unknown (07/14/2023)   Received from [facility]   Epic    At any time in the past 12 months, were you homeless or living in a shelter (including now)?: No    Unable to Pay for Housing in the Last Year: Not on file    Number of Times Moved in the Last Year: Not on file  Utilities: Not At Risk  (02/22/2023)   Received from [facility] Utilities    Threatened with loss of utilities: No  Health Literacy: Adequate Health Literacy (12/21/2022)   B1300 Health Literacy    Frequency of need for help with medical instructions: Never     Family History: The patient's family history includes BRCA 1/2 in her cousin; Breast cancer in her daughter, maternal aunt, and mother; Cancer in her daughter, maternal aunt, and mother. There is no history of Colon polyps, Colon cancer, Esophageal cancer, Rectal cancer, or Stomach cancer. ROS:   Please see the history of present illness.    All 14 point review of systems negative except as described per history of present illness.  EKGs/Labs/Other Studies Reviewed:    The following studies were reviewed today:   EKG:       EKG at PCPs office  Recent Labs: 02/11/2024: ALT 13; BUN 7; Creatinine, Ser 1.02; Potassium 3.9; Sodium 146; TSH 0.669  Recent Lipid Panel    Component Value Date/Time   CHOL 178 02/11/2024 1040   TRIG 55 02/11/2024 1040   HDL 53 02/11/2024 1040   CHOLHDL 3.6 07/25/2020 0909   CHOLHDL 2.8 12/17/2015 1035   VLDL 10 12/17/2015 1035   LDLCALC 114 (H) 02/11/2024 1040    Physical Exam:    VS:  BP 128/84   Pulse 97   Ht 5' 8 (1.727 m)   Wt 224 lb 1.9 oz (101.7 kg)   SpO2 96%   BMI 34.08 kg/m     Wt Readings from Last 3 Encounters:  02/16/24 224 lb 1.9 oz (101.7 kg)  02/07/24 229 lb (103.9 kg)  07/28/23 232 lb 3.2 oz (105.3 kg)     GENERAL:  Well nourished, well developed in no acute distress NECK: No JVD; No carotid bruits CARDIAC: RRR, S1 and S2 present, no murmurs, no rubs, no gallops CHEST:  Clear to auscultation without rales, wheezing or rhonchi  Extremities: No pitting pedal edema. Pulses bilaterally symmetric with radial 2+ and dorsalis pedis 2+ NEUROLOGIC:  Alert and oriented x 3  Medication Adjustments/Labs and Tests Ordered: Current medicines are reviewed at length with the patient today.   Concerns regarding medicines are outlined above.  Orders Placed  This Encounter  Procedures   LONG TERM MONITOR (3-14 DAYS)   EKG 12-Lead   ECHOCARDIOGRAM COMPLETE   No orders of the defined types were placed in this encounter.   Signed, Earnestine Shipp reddy Govani Radloff, MD, MPH, [facility]. 02/16/2024 12:01 PM    [facility]     [1]  Current Meds  Medication Sig   carvedilol  (COREG ) 25 MG tablet Take 1 tablet (25 mg total) by mouth 2 (two) times daily with a meal.   ezetimibe  (ZETIA ) 10 MG tablet Take 1 tablet (10 mg total) by mouth daily.   furosemide  (LASIX ) 20 MG tablet Take 1 tablet (20 mg total) by mouth daily as needed. For pedal edema   losartan  (COZAAR ) 100 MG tablet Take 1 tablet (100 mg total) by mouth daily.   spironolactone  (ALDACTONE ) 25 MG tablet Take 1 tablet (25 mg total) by mouth daily.   "

## 2024-02-24 ENCOUNTER — Other Ambulatory Visit: Payer: Self-pay

## 2024-02-24 DIAGNOSIS — R002 Palpitations: Secondary | ICD-10-CM

## 2024-02-24 DIAGNOSIS — I10 Essential (primary) hypertension: Secondary | ICD-10-CM

## 2024-02-24 DIAGNOSIS — R9431 Abnormal electrocardiogram [ECG] [EKG]: Secondary | ICD-10-CM

## 2024-02-24 DIAGNOSIS — E782 Mixed hyperlipidemia: Secondary | ICD-10-CM

## 2024-03-02 ENCOUNTER — Ambulatory Visit (HOSPITAL_BASED_OUTPATIENT_CLINIC_OR_DEPARTMENT_OTHER)
Admission: RE | Admit: 2024-03-02 | Discharge: 2024-03-02 | Disposition: A | Discharge status: Home / Self Care | Source: Ambulatory Visit

## 2024-03-02 DIAGNOSIS — R002 Palpitations: Secondary | ICD-10-CM | POA: Diagnosis present

## 2024-03-02 DIAGNOSIS — R9431 Abnormal electrocardiogram [ECG] [EKG]: Secondary | ICD-10-CM | POA: Insufficient documentation

## 2024-03-02 LAB — ECHOCARDIOGRAM COMPLETE
AR max vel: 3.26 cm2
AV Area VTI: 3.5 cm2
AV Area mean vel: 3.74 cm2
AV Mean grad: 3 mmHg
AV Peak grad: 5.3 mmHg
Ao pk vel: 1.15 m/s
Area-P 1/2: 2.11 cm2
Calc EF: 58.1 %
S' Lateral: 2.6 cm
Single Plane A2C EF: 59.4 %
Single Plane A4C EF: 60.3 %

## 2024-03-09 ENCOUNTER — Ambulatory Visit: Payer: Self-pay

## 2024-03-09 DIAGNOSIS — R002 Palpitations: Secondary | ICD-10-CM | POA: Diagnosis not present

## 2024-08-07 ENCOUNTER — Ambulatory Visit: Payer: Self-pay | Admitting: Family Medicine
